# Patient Record
Sex: Female | Born: 1985 | Race: Black or African American | Hispanic: No | Smoking: Never smoker
Health system: Southern US, Community
[De-identification: ages and names within clinical notes are randomized; demographics above are authoritative.]

## PROBLEM LIST (undated history)

## (undated) ENCOUNTER — Inpatient Hospital Stay (HOSPITAL_COMMUNITY): Payer: Self-pay

## (undated) DIAGNOSIS — R000 Tachycardia, unspecified: Secondary | ICD-10-CM

## (undated) DIAGNOSIS — O210 Mild hyperemesis gravidarum: Secondary | ICD-10-CM

## (undated) DIAGNOSIS — O169 Unspecified maternal hypertension, unspecified trimester: Secondary | ICD-10-CM

## (undated) DIAGNOSIS — D649 Anemia, unspecified: Secondary | ICD-10-CM

## (undated) DIAGNOSIS — D563 Thalassemia minor: Secondary | ICD-10-CM

## (undated) DIAGNOSIS — K649 Unspecified hemorrhoids: Secondary | ICD-10-CM

## (undated) DIAGNOSIS — I10 Essential (primary) hypertension: Secondary | ICD-10-CM

## (undated) HISTORY — DX: Anemia, unspecified: D64.9

## (undated) HISTORY — DX: Unspecified maternal hypertension, unspecified trimester: O16.9

## (undated) HISTORY — DX: Thalassemia minor: D56.3

## (undated) HISTORY — DX: Tachycardia, unspecified: R00.0

## (undated) HISTORY — DX: Mild hyperemesis gravidarum: O21.0

---
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
## 2006-08-10 ENCOUNTER — Emergency Department (HOSPITAL_COMMUNITY): Admission: EM | Admit: 2006-08-10 | Discharge: 2006-08-10 | Payer: Self-pay | Admitting: Emergency Medicine

## 2006-09-01 ENCOUNTER — Ambulatory Visit: Payer: Self-pay | Admitting: Family Medicine

## 2007-02-17 ENCOUNTER — Encounter: Payer: Self-pay | Admitting: Obstetrics and Gynecology

## 2007-02-17 ENCOUNTER — Ambulatory Visit: Payer: Self-pay | Admitting: Obstetrics & Gynecology

## 2007-08-13 ENCOUNTER — Inpatient Hospital Stay (HOSPITAL_COMMUNITY): Admission: AD | Admit: 2007-08-13 | Discharge: 2007-08-13 | Payer: Self-pay | Admitting: Obstetrics & Gynecology

## 2007-12-13 ENCOUNTER — Emergency Department (HOSPITAL_COMMUNITY): Admission: EM | Admit: 2007-12-13 | Discharge: 2007-12-13 | Payer: Self-pay | Admitting: Emergency Medicine

## 2007-12-15 ENCOUNTER — Inpatient Hospital Stay (HOSPITAL_COMMUNITY): Admission: AD | Admit: 2007-12-15 | Discharge: 2007-12-15 | Payer: Self-pay | Admitting: Obstetrics and Gynecology

## 2008-01-18 ENCOUNTER — Inpatient Hospital Stay (HOSPITAL_COMMUNITY): Admission: AD | Admit: 2008-01-18 | Discharge: 2008-01-18 | Payer: Self-pay | Admitting: Obstetrics & Gynecology

## 2008-01-25 ENCOUNTER — Inpatient Hospital Stay (HOSPITAL_COMMUNITY): Admission: AD | Admit: 2008-01-25 | Discharge: 2008-01-25 | Payer: Self-pay | Admitting: Obstetrics

## 2008-02-01 ENCOUNTER — Inpatient Hospital Stay (HOSPITAL_COMMUNITY): Admission: AD | Admit: 2008-02-01 | Discharge: 2008-02-01 | Payer: Self-pay | Admitting: Obstetrics

## 2008-03-17 ENCOUNTER — Ambulatory Visit (HOSPITAL_COMMUNITY): Admission: RE | Admit: 2008-03-17 | Discharge: 2008-03-17 | Payer: Self-pay | Admitting: Obstetrics & Gynecology

## 2008-06-02 ENCOUNTER — Ambulatory Visit (HOSPITAL_COMMUNITY): Admission: RE | Admit: 2008-06-02 | Discharge: 2008-06-02 | Payer: Self-pay | Admitting: Obstetrics & Gynecology

## 2008-08-10 ENCOUNTER — Inpatient Hospital Stay (HOSPITAL_COMMUNITY): Admission: RE | Admit: 2008-08-10 | Discharge: 2008-08-13 | Payer: Self-pay | Admitting: Obstetrics & Gynecology

## 2008-08-11 ENCOUNTER — Encounter: Payer: Self-pay | Admitting: Obstetrics & Gynecology

## 2008-10-03 ENCOUNTER — Inpatient Hospital Stay (HOSPITAL_COMMUNITY): Admission: AD | Admit: 2008-10-03 | Discharge: 2008-10-03 | Payer: Self-pay | Admitting: Obstetrics & Gynecology

## 2009-11-28 ENCOUNTER — Emergency Department (HOSPITAL_COMMUNITY): Admission: EM | Admit: 2009-11-28 | Discharge: 2009-11-28 | Payer: Self-pay | Admitting: Family Medicine

## 2010-01-18 ENCOUNTER — Emergency Department (HOSPITAL_COMMUNITY): Admission: EM | Admit: 2010-01-18 | Discharge: 2010-01-18 | Payer: Self-pay | Admitting: Family Medicine

## 2010-03-20 ENCOUNTER — Ambulatory Visit: Payer: Self-pay | Admitting: Nurse Practitioner

## 2010-03-20 ENCOUNTER — Inpatient Hospital Stay (HOSPITAL_COMMUNITY): Admission: AD | Admit: 2010-03-20 | Discharge: 2010-03-20 | Payer: Self-pay | Admitting: Obstetrics & Gynecology

## 2010-06-19 ENCOUNTER — Ambulatory Visit: Payer: Self-pay | Admitting: Nurse Practitioner

## 2010-06-19 ENCOUNTER — Inpatient Hospital Stay (HOSPITAL_COMMUNITY): Admission: AD | Admit: 2010-06-19 | Discharge: 2010-06-19 | Payer: Self-pay | Admitting: Obstetrics and Gynecology

## 2010-06-24 ENCOUNTER — Ambulatory Visit: Payer: Self-pay | Admitting: Nurse Practitioner

## 2010-06-24 ENCOUNTER — Inpatient Hospital Stay (HOSPITAL_COMMUNITY): Admission: RE | Admit: 2010-06-24 | Discharge: 2010-06-24 | Payer: Self-pay | Admitting: Family Medicine

## 2010-08-21 ENCOUNTER — Ambulatory Visit: Payer: Self-pay | Admitting: Obstetrics and Gynecology

## 2010-08-21 LAB — CONVERTED CEMR LAB
HCT: 30.3 % — ABNORMAL LOW (ref 36.0–46.0)
HIV: NONREACTIVE
Hemoglobin: 10 g/dL — ABNORMAL LOW (ref 12.0–15.0)
MCHC: 33 g/dL (ref 30.0–36.0)
MCV: 79.3 fL (ref 78.0–100.0)
Platelets: 247 10*3/uL (ref 150–400)
RBC: 3.82 M/uL — ABNORMAL LOW (ref 3.87–5.11)
RDW: 13.5 % (ref 11.5–15.5)
WBC: 9.1 10*3/uL (ref 4.0–10.5)

## 2010-09-04 ENCOUNTER — Ambulatory Visit: Payer: Self-pay | Admitting: Obstetrics and Gynecology

## 2010-09-18 ENCOUNTER — Ambulatory Visit: Payer: Self-pay | Admitting: Obstetrics and Gynecology

## 2010-09-25 ENCOUNTER — Ambulatory Visit
Admission: RE | Admit: 2010-09-25 | Discharge: 2010-09-25 | Payer: Self-pay | Source: Home / Self Care | Attending: Obstetrics and Gynecology | Admitting: Obstetrics and Gynecology

## 2010-09-25 LAB — POCT URINALYSIS DIPSTICK
Bilirubin Urine: NEGATIVE
Ketones, ur: 15 mg/dL — AB
Nitrite: NEGATIVE
Protein, ur: NEGATIVE mg/dL
Specific Gravity, Urine: >=1.03 (ref 1.005–1.030)
Urine Glucose, Fasting: NEGATIVE mg/dL
Urobilinogen, UA: 1 mg/dL (ref 0.0–1.0)
pH: 6 (ref 5.0–8.0)

## 2010-10-02 ENCOUNTER — Ambulatory Visit
Admission: RE | Admit: 2010-10-02 | Discharge: 2010-10-02 | Payer: Self-pay | Source: Home / Self Care | Attending: Obstetrics and Gynecology | Admitting: Obstetrics and Gynecology

## 2010-10-07 LAB — POCT URINALYSIS DIPSTICK
Bilirubin Urine: NEGATIVE
Ketones, ur: NEGATIVE mg/dL
Nitrite: NEGATIVE
Protein, ur: NEGATIVE mg/dL
Specific Gravity, Urine: 1.02 (ref 1.005–1.030)
Urine Glucose, Fasting: NEGATIVE mg/dL
Urobilinogen, UA: 0.2 mg/dL (ref 0.0–1.0)
pH: 6 (ref 5.0–8.0)

## 2010-10-16 ENCOUNTER — Encounter: Payer: Self-pay | Admitting: Obstetrics and Gynecology

## 2010-10-16 ENCOUNTER — Ambulatory Visit
Admission: RE | Admit: 2010-10-16 | Discharge: 2010-10-16 | Payer: Self-pay | Source: Home / Self Care | Attending: Obstetrics and Gynecology | Admitting: Obstetrics and Gynecology

## 2010-10-16 LAB — POCT URINALYSIS DIPSTICK
Bilirubin Urine: NEGATIVE
Ketones, ur: NEGATIVE mg/dL
Nitrite: NEGATIVE
Protein, ur: NEGATIVE mg/dL
Specific Gravity, Urine: 1.025 (ref 1.005–1.030)
Urine Glucose, Fasting: NEGATIVE mg/dL
Urobilinogen, UA: 1 mg/dL (ref 0.0–1.0)
pH: 6 (ref 5.0–8.0)

## 2010-10-16 LAB — CONVERTED CEMR LAB
Chlamydia, DNA Probe: NEGATIVE
GC Probe Amp, Genital: NEGATIVE

## 2010-10-17 ENCOUNTER — Encounter: Payer: Self-pay | Admitting: Obstetrics and Gynecology

## 2010-10-17 LAB — CONVERTED CEMR LAB
Clue Cells Wet Prep HPF POC: NONE SEEN
Trich, Wet Prep: NONE SEEN
WBC, Wet Prep HPF POC: NONE SEEN

## 2010-10-18 ENCOUNTER — Inpatient Hospital Stay (HOSPITAL_COMMUNITY)
Admission: AD | Admit: 2010-10-18 | Discharge: 2010-10-18 | Payer: Self-pay | Source: Home / Self Care | Attending: Obstetrics and Gynecology | Admitting: Obstetrics and Gynecology

## 2010-10-18 LAB — WET PREP, GENITAL
Clue Cells Wet Prep HPF POC: NONE SEEN
Trich, Wet Prep: NONE SEEN
Yeast Wet Prep HPF POC: NONE SEEN

## 2010-10-23 ENCOUNTER — Other Ambulatory Visit: Payer: Self-pay

## 2010-10-23 DIAGNOSIS — O0930 Supervision of pregnancy with insufficient antenatal care, unspecified trimester: Secondary | ICD-10-CM

## 2010-10-23 DIAGNOSIS — O34219 Maternal care for unspecified type scar from previous cesarean delivery: Secondary | ICD-10-CM

## 2010-10-23 LAB — POCT URINALYSIS DIPSTICK
Bilirubin Urine: NEGATIVE
Ketones, ur: NEGATIVE mg/dL
Nitrite: NEGATIVE
Protein, ur: NEGATIVE mg/dL
Specific Gravity, Urine: 1.025 (ref 1.005–1.030)
Urine Glucose, Fasting: NEGATIVE mg/dL
Urobilinogen, UA: 0.2 mg/dL (ref 0.0–1.0)
pH: 6 (ref 5.0–8.0)

## 2010-10-30 ENCOUNTER — Other Ambulatory Visit: Payer: Self-pay

## 2010-10-30 ENCOUNTER — Inpatient Hospital Stay (HOSPITAL_COMMUNITY)
Admission: AD | Admit: 2010-10-30 | Discharge: 2010-10-30 | Disposition: A | Discharge status: Home / Self Care | Payer: Medicaid Other | Source: Ambulatory Visit | Attending: Obstetrics and Gynecology | Admitting: Obstetrics and Gynecology

## 2010-10-30 DIAGNOSIS — O34219 Maternal care for unspecified type scar from previous cesarean delivery: Secondary | ICD-10-CM

## 2010-10-30 DIAGNOSIS — O479 False labor, unspecified: Secondary | ICD-10-CM | POA: Insufficient documentation

## 2010-10-30 DIAGNOSIS — O0930 Supervision of pregnancy with insufficient antenatal care, unspecified trimester: Secondary | ICD-10-CM

## 2010-10-30 LAB — POCT URINALYSIS DIPSTICK
Bilirubin Urine: NEGATIVE
Ketones, ur: NEGATIVE mg/dL
Nitrite: NEGATIVE
Protein, ur: NEGATIVE mg/dL
Specific Gravity, Urine: 1.015 (ref 1.005–1.030)
Urine Glucose, Fasting: NEGATIVE mg/dL
Urobilinogen, UA: 0.2 mg/dL (ref 0.0–1.0)
pH: 6 (ref 5.0–8.0)

## 2010-10-31 ENCOUNTER — Encounter: Payer: Self-pay | Admitting: Obstetrics and Gynecology

## 2010-11-04 ENCOUNTER — Inpatient Hospital Stay (HOSPITAL_COMMUNITY)
Admission: RE | Admit: 2010-11-04 | Discharge: 2010-11-07 | DRG: 766 | Disposition: A | Discharge status: Home / Self Care | Payer: Medicaid Other | Source: Ambulatory Visit | Attending: Obstetrics and Gynecology | Admitting: Obstetrics and Gynecology

## 2010-11-04 ENCOUNTER — Inpatient Hospital Stay (HOSPITAL_COMMUNITY)
Admission: AD | Admit: 2010-11-04 | Discharge: 2010-11-04 | Disposition: A | Discharge status: Home / Self Care | Payer: Medicaid Other | Source: Ambulatory Visit | Attending: Obstetrics and Gynecology | Admitting: Obstetrics and Gynecology

## 2010-11-04 DIAGNOSIS — O99892 Other specified diseases and conditions complicating childbirth: Secondary | ICD-10-CM | POA: Diagnosis present

## 2010-11-04 DIAGNOSIS — O9989 Other specified diseases and conditions complicating pregnancy, childbirth and the puerperium: Secondary | ICD-10-CM

## 2010-11-04 DIAGNOSIS — Z22330 Carrier of Group B streptococcus: Secondary | ICD-10-CM

## 2010-11-04 DIAGNOSIS — O34219 Maternal care for unspecified type scar from previous cesarean delivery: Secondary | ICD-10-CM

## 2010-11-04 DIAGNOSIS — O479 False labor, unspecified: Secondary | ICD-10-CM | POA: Insufficient documentation

## 2010-11-04 LAB — SURGICAL PCR SCREEN
MRSA, PCR: NEGATIVE
Staphylococcus aureus: NEGATIVE

## 2010-11-04 LAB — CBC
HCT: 31.5 % — ABNORMAL LOW (ref 36.0–46.0)
Hemoglobin: 9.9 g/dL — ABNORMAL LOW (ref 12.0–15.0)
MCH: 24.6 pg — ABNORMAL LOW (ref 26.0–34.0)
MCHC: 31.4 g/dL (ref 30.0–36.0)
MCV: 78.4 fL (ref 78.0–100.0)
Platelets: 256 10*3/uL (ref 150–400)
RBC: 4.02 MIL/uL (ref 3.87–5.11)
RDW: 15.5 % (ref 11.5–15.5)
WBC: 11.7 10*3/uL — ABNORMAL HIGH (ref 4.0–10.5)

## 2010-11-04 LAB — RPR: RPR Ser Ql: NONREACTIVE

## 2010-11-05 LAB — CBC
HCT: 26.5 % — ABNORMAL LOW (ref 36.0–46.0)
Hemoglobin: 8.4 g/dL — ABNORMAL LOW (ref 12.0–15.0)
MCH: 24.9 pg — ABNORMAL LOW (ref 26.0–34.0)
MCHC: 31.7 g/dL (ref 30.0–36.0)
MCV: 78.6 fL (ref 78.0–100.0)
Platelets: 209 10*3/uL (ref 150–400)
RBC: 3.37 MIL/uL — ABNORMAL LOW (ref 3.87–5.11)
RDW: 15.7 % — ABNORMAL HIGH (ref 11.5–15.5)
WBC: 10.2 10*3/uL (ref 4.0–10.5)

## 2010-11-07 NOTE — Op Note (Addendum)
NAME:  Gwendolyn Lynch, Gwendolyn Lynch NO.:  [id number]  MEDICAL RECORD NO.:  [id number]         PATIENT TYPE:  WINP  LOCATION:  102                           [facility]ACILITY:  [facility]  PHYSICIAN:  Catalina Antigua, MD     DATE OF BIRTH:  14-Jan-1986  DATE OF PROCEDURE: DATE OF DISCHARGE:                              OPERATIVE REPORT   PREOPERATIVE DIAGNOSIS:  A 25 year old, gravida 3, para 2-0-0-2, with a history of C-section x1 who desires a repeat C-section.  POSTOPERATIVE DIAGNOSIS:  A 25 year old, gravida 3, para 2-0-0-2, with a history of C-section x1 who desires a repeat C-section.  PROCEDURE:  Repeat low transverse cesarean section.  SURGEONS:  Catalina Antigua, MD and Lucina Mellow, DO  ANESTHESIA:  Spinal.  FINDINGS:  A female infant, 10 pounds and 6 ounces, Apgars 8 and 9. Normal uterus, tubes, and ovaries.  SPECIMENS:  Placenta to Labor and Delivery.  ESTIMATED BLOOD LOSS:  700.Marland Kitchen  FLUIDS:  4000.  URINE:  200.Marland Kitchen  INDICATION:  This is a 25 year old, gravida 3, para 2-0-0-2, with history of cesarean section who desires elective repeat.  FINDINGS:  A female infant in the vertex presentation, Apgars 8 and 9. Normal uterus, tubes, and ovaries.  PROCEDURE:  The patient was taken to the operating room where spinal anesthesia was found to be adequate.  She was prepared and draped in a normal sterile fashion in the dorsal supine position with a leftward tilt.  A Pfannenstiel skin incision was then made with the scalpel and carried through to the underlying fascia with the Bovie and the knife. The fascia was incised in the midline and excised and the incision was extended laterally with Mayo scissors.  The superior aspect of the fascial incision was then grasped with Kocher clamps, elevated and the underlying rectus muscle was dissected off bluntly.  Attention was then turned to the inferior aspect of the incision which in similar fashion was grasped, tented up with Kocher  clamps and the rectus muscle dissected off bluntly.  The rectus muscles were then separated in the midline.  The peritoneum was identified, tented up, and entered sharply with hemostats.  The peritoneal incision was extended laterally superiorly with good visualization of the bladder.  Alexis retractor was then inserted and the uterine and vesicouterine peritoneum was identified, grasped with pickups, and entered sharply with Metzenbaum scissors.  The incision was extended laterally and the bladder flap was created digitally.  The lower uterine segment was then incised in a transverse fashion with the scalpel.  The uterine incision was then extended with lateral traction.  The infant's head was delivered atraumatically.  The nose and mouth were suctioned with bulb suction and the cord was clamped and cut. Cord blood sample was obtained.  The infant was handed off to the waiting pediatricians.  The placenta was then removed manually.  The uterus was cleared of all clots and debris.  The uterine incision was repaired with 2-0 Vicryl in a running locked fashion.  Excellent hemostasis was noted to be obtained.  The ovaries and tubes were then identified bilaterally and noted to be normal.  The gutters were  cleared of all clots.  The area was irrigated and the peritoneum was closed with 3-0 Vicryl.  The fascia was then reapproximated with 0 Vicryl in a running fashion.  The skin was closed with staples.  The patient tolerated the procedure well. Sponge, lap, and needle counts were correct x3.  One gram of Ancef was given prior to procedure.  The patient was taken to the recovery room in stable condition and the infant to the Nursery.    ______________________________ Lucina Mellow, DO   ______________________________ Catalina Antigua, MD    SH/MEDQ  D:  11/04/2010  T:  11/05/2010  Job:  [id number]  Electronically Signed by Lucina Mellow MD on 11/07/2010 07:06:19  PM Electronically Signed by Catalina Antigua  on 11/17/2010 10:21:47 AM

## 2010-11-11 NOTE — Discharge Summary (Addendum)
  Gwendolyn Lynch, Gwendolyn Lynch              ACCOUNT NO.:  [id number]  MEDICAL RECORD NO.:  [id number]           PATIENT TYPE:  I  LOCATION:  9102                          [facility]:  [facility]  PHYSICIAN:  Catalina Antigua, MD     DATE OF BIRTH:  [birth year]  DATE OF ADMISSION:  11/04/2010 DATE OF DISCHARGE:  11/07/2010                              DISCHARGE SUMMARY   ADMISSION DIAGNOSES: 1. Intrauterine pregnancy at 17 and 1 weeks. 2. Previous cesarean section x1.  DISCHARGE DIAGNOSIS:  Status post repeat low transverse cesarean section on November 04, 2010.  ATTENDING:  Catalina Antigua, MD  FELLOW:  Maryelizabeth Kaufmann, MD  DISCHARGE MEDICATIONS: 1. Ibuprofen 600 mg one tablet p.o. q.6 h. as needed. 2. Percocet 5/325 one tablet p.o. q.4 h. as needed. 3. Colace 100 mg one tablet p.o. b.i.d. 4. Ferrous sulfate 325 one tablet p.o. b.i.d.  OPERATIONS:  Repeat low transverse cesarean section on November 04, 2010, by Dr. Jolayne Panther and Dr. Natale Milch with delivery of a viable female infant, Apgars were 8 and 9, weight was 6 pounds 10 ounces.  Normal uterus, tubes, and ovaries and no complications.  PERTINENT LABORATORY:  Postoperative hemoglobin was 8.4 and 26.5 down from initial preop hemoglobin of 9.9 and 31.5.  Platelets were stable at 209.  HOSPITAL COURSE:  On admission, this is a gravida 3, para 1-0-1-1, who presented with intrauterine pregnancy at 39 weeks and 1 day with previous C-section, who desire repeat C-section.  The patient underwent cesarean section as previously stated without any complications.  Her postoperative course was benign.  Postoperative hemoglobin is as mentioned with 8.4 and hemoglobin 26.5.  The patient is otherwise ambulatory, asymptomatic and vital signs were stable.  The patient was continued on iron for postoperative and postpartum anemia.  The patient was otherwise hemodynamically stable and afebrile.  The patient was discharged home on postoperative day 3 in stable  condition.  There were no complications.  DISPOSITION:  Discharged to home.  DISCHARGE CONDITION:  Stable.  FOLLOWUP:  The patient is to follow up in [facility] in 6 weeks for postpartum check.  We will have the Baby Love Nurse to do a 2-week wound check at home.  EMERGENCY ROOM WARNINGS:  The patient should return to the emergency department with any fever, chills, nausea, vomiting, any problems with her incision such as redness, swelling, opening, dehiscences, discharge, foul odor, or any other concerning symptoms.    ______________________________ Maryelizabeth Kaufmann, MD   ______________________________ Catalina Antigua, MD    LC/MEDQ  D:  11/07/2010  T:  11/08/2010  Job:  [id number]  Electronically Signed by Maryelizabeth Kaufmann MD on 11/11/2010 12:16:11 PM Electronically Signed by Catalina Antigua  on 11/17/2010 10:21:41 AM

## 2010-12-02 LAB — POCT URINALYSIS DIPSTICK
Bilirubin Urine: NEGATIVE
Glucose, UA: NEGATIVE mg/dL
Ketones, ur: NEGATIVE mg/dL
Nitrite: NEGATIVE
Protein, ur: NEGATIVE mg/dL
Specific Gravity, Urine: 1.02 (ref 1.005–1.030)
Urobilinogen, UA: 0.2 mg/dL (ref 0.0–1.0)
pH: 6.5 (ref 5.0–8.0)

## 2010-12-03 LAB — POCT URINALYSIS DIPSTICK
Bilirubin Urine: NEGATIVE
Glucose, UA: NEGATIVE mg/dL
Ketones, ur: NEGATIVE mg/dL
Nitrite: NEGATIVE
Protein, ur: NEGATIVE mg/dL
Specific Gravity, Urine: >=1.03 (ref 1.005–1.030)
Urobilinogen, UA: 0.2 mg/dL (ref 0.0–1.0)
pH: 6 (ref 5.0–8.0)

## 2010-12-05 LAB — CBC
HCT: 28.2 % — ABNORMAL LOW (ref 36.0–46.0)
Hemoglobin: 9.6 g/dL — ABNORMAL LOW (ref 12.0–15.0)
MCH: 28.2 pg (ref 26.0–34.0)
MCHC: 34.2 g/dL (ref 30.0–36.0)
MCV: 82.3 fL (ref 78.0–100.0)
Platelets: 196 10*3/uL (ref 150–400)
RBC: 3.43 MIL/uL — ABNORMAL LOW (ref 3.87–5.11)
RDW: 14.8 % (ref 11.5–15.5)
WBC: 9.9 10*3/uL (ref 4.0–10.5)

## 2010-12-05 LAB — URINE CULTURE
Colony Count: >=100000
Culture  Setup Time: 201109290117

## 2010-12-05 LAB — URINALYSIS, ROUTINE W REFLEX MICROSCOPIC
Bilirubin Urine: NEGATIVE
Glucose, UA: NEGATIVE mg/dL
Ketones, ur: NEGATIVE mg/dL
Nitrite: NEGATIVE
Protein, ur: NEGATIVE mg/dL
Specific Gravity, Urine: >1.03 — ABNORMAL HIGH (ref 1.005–1.030)
Urobilinogen, UA: 0.2 mg/dL (ref 0.0–1.0)
pH: 6 (ref 5.0–8.0)

## 2010-12-05 LAB — DIFFERENTIAL
Basophils Absolute: 0.1 10*3/uL (ref 0.0–0.1)
Basophils Relative: 1 % (ref 0–1)
Eosinophils Absolute: 0.5 10*3/uL (ref 0.0–0.7)
Eosinophils Relative: 5 % (ref 0–5)
Lymphocytes Relative: 22 % (ref 12–46)
Lymphs Abs: 2.1 10*3/uL (ref 0.7–4.0)
Monocytes Absolute: 0.4 10*3/uL (ref 0.1–1.0)
Monocytes Relative: 4 % (ref 3–12)
Neutro Abs: 6.8 10*3/uL (ref 1.7–7.7)
Neutrophils Relative %: 69 % (ref 43–77)

## 2010-12-05 LAB — URINE MICROSCOPIC-ADD ON

## 2010-12-05 LAB — SICKLE CELL SCREEN: Sickle Cell Screen: NEGATIVE

## 2010-12-05 LAB — RUBELLA SCREEN: Rubella: 75.6 IU/mL — ABNORMAL HIGH

## 2010-12-05 LAB — GC/CHLAMYDIA PROBE AMP, GENITAL
Chlamydia, DNA Probe: NEGATIVE
GC Probe Amp, Genital: NEGATIVE

## 2010-12-05 LAB — WET PREP, GENITAL
Clue Cells Wet Prep HPF POC: NONE SEEN
Trich, Wet Prep: NONE SEEN
Yeast Wet Prep HPF POC: NONE SEEN

## 2010-12-05 LAB — RPR: RPR Ser Ql: NONREACTIVE

## 2010-12-05 LAB — TYPE AND SCREEN: Antibody Screen: NEGATIVE

## 2010-12-08 LAB — WET PREP, GENITAL
Clue Cells Wet Prep HPF POC: NONE SEEN
Yeast Wet Prep HPF POC: NONE SEEN

## 2010-12-08 LAB — CBC
Hemoglobin: 11.4 g/dL — ABNORMAL LOW (ref 12.0–15.0)
MCH: 26.6 pg (ref 26.0–34.0)
MCV: 79.1 fL (ref 78.0–100.0)
Platelets: 225 10*3/uL (ref 150–400)
RBC: 4.29 MIL/uL (ref 3.87–5.11)

## 2010-12-08 LAB — GC/CHLAMYDIA PROBE AMP, GENITAL: Chlamydia, DNA Probe: NEGATIVE

## 2010-12-16 LAB — POCT URINALYSIS DIP (DEVICE)
Bilirubin Urine: NEGATIVE
Ketones, ur: NEGATIVE mg/dL
pH: 5.5 (ref 5.0–8.0)

## 2010-12-25 ENCOUNTER — Ambulatory Visit (INDEPENDENT_AMBULATORY_CARE_PROVIDER_SITE_OTHER): Payer: Medicaid Other | Admitting: Obstetrics and Gynecology

## 2011-02-04 NOTE — Op Note (Signed)
Gwendolyn Lynch, Gwendolyn Lynch NO.:  [id number]   MEDICAL RECORD NO.:  [id number]          PATIENT TYPE:  INP   LOCATION:  9113                          [facility]ACILITY:  [facility]   PHYSICIAN:  Roseanna Rainbow, M.D.DATE OF BIRTH:  [birth year]   DATE OF PROCEDURE:  08/11/2008  DATE OF DISCHARGE:                               OPERATIVE REPORT   PREOPERATIVE DIAGNOSES:  Intrauterine pregnancy at term, protracted  active phase of labor, suspicious fetal heart tracing with variable  decelerations.   POSTOPERATIVE DIAGNOSES:  Intrauterine pregnancy at term, protracted  active phase of labor, suspicious fetal heart tracing with variable  decelerations.   PROCEDURE:  Primary low uterine flap elliptical cesarean delivery via  Pfannenstiel skin incision.   SURGEON:  Roseanna Rainbow, MD   ANESTHESIA:  Epidural.   ESTIMATED BLOOD LOSS:  600 mL.   COMPLICATIONS:  None.   PROCEDURE:  The patient was taken to the operating room with an IV  running and an epidural catheter in situ.  She was then placed in the  dorsal supine position with a leftward tilt and prepped and draped in  the usual sterile fashion.  After a time-out had been completed, a  Pfannenstiel skin incision was then made with the scalpel and carried  down to the underlying fascia.  The fascia was nicked in the midline.  The fascial incision was then extended bilaterally.  The superior aspect  of the fascial incision was tented up and the underlying rectus muscles  dissected off.  The inferior aspect of the fascial incision was  manipulated in a similar fashion.  The rectus muscles were separated in  the midline.  The parietal peritoneum was tented up and entered sharply.  This incision was then extended superiorly and inferiorly with good  visualization of the bladder.  The bladder blade was then placed.  The  vesicouterine peritoneum was then tented up and entered sharply.  This  incision was then extended  bilaterally and the bladder flap created  bluntly.  The bladder blade was then replaced.  The lower uterine  segment was then incised in a transverse fashion with a scalpel.  This  incision was then extended bluntly.  The infant's head was then  delivered atraumatically.  The oropharynx was suctioned with bulb  suction.  The cord was clamped and cut.  The infant was handed off to  the awaiting neonatologist.  The placenta was then removed.  The  intrauterine cavity was evacuated of any remaining amniotic fluid clots  and debris with moistened laparotomy sponge.  The uterine incision was  then reapproximated in a running interlocking fashion using a suture of  Monocryl.  A second imbricating layer of the same suture was then  placed.  Adequate hemostasis was noted.  The paracolic gutters were then  irrigated.  The parietal peritoneum was then reapproximated in a running  fashion using a running suture of 2-0 Vicryl.  The fascia  was closed in a running fashion using 2 sutures of Vicryl.  The fascia  was closed with staples.  At the closure of the procedure,  the  instrument and pack counts were said to be correct x2.  Cephazolin 1 g  had been given at the start of the procedure.  The patient was taken to  the PACU awake and in stable condition.      Roseanna Rainbow, M.D.  Electronically Signed     LAJ/MEDQ  D:  08/11/2008  T:  08/12/2008  Job:  [id number]

## 2011-02-04 NOTE — Discharge Summary (Signed)
NAMEMARIAEDUARDA, DEFRANCO NO.:  [id number]   MEDICAL RECORD NO.:  [id number]          PATIENT TYPE:  INP   LOCATION:  9113                          [facility]   PHYSICIAN:  Roseanna Rainbow, M.D.DATE OF BIRTH:  06-14-1986   DATE OF ADMISSION:  08/10/2008  DATE OF DISCHARGE:  08/13/2008                               DISCHARGE SUMMARY   CHIEF COMPLAINT:  The patient is a 25 year old gravida 2, para 1 with an  estimated date of confinement of August 12, 2008, who presents for  induction of labor.   HISTORY OF PRESENT ILLNESS:  Please see the above.   PAST SURGICAL HISTORY:  She denies.   PAST MEDICAL HISTORY:  Noncontributory.   MEDICATIONS:  Prenatal vitamins.   ALLERGIES:  No known drug allergies.   SOCIAL HISTORY:  She is single.  She denies any tobacco, ethanol, or  drug use.   FAMILY HISTORY:  Remarkable for hypertension, diabetes.   PHYSICAL EXAMINATION:  VITAL SIGNS:  Stable, afebrile.  LUNGS:  Clear to auscultation bilaterally.  HEART:  Regular rate and rhythm.  ABDOMEN:  Gravid, nontender.  The cervix is closed, long with the vertex  at -2 station.   ASSESSMENT/PLAN:  Intrauterine pregnancy at 39 weeks for induction of  labor.   PLAN:  Admission.  Cervical ripening with Cytotec.   HOSPITAL COURSE:  Several hours after admission, there was a prolonged  fetal heart deceleration associated with hypertonic uterine  contractions.  Intrauterine resuscitative efforts included a dose of  subcutaneous terbutaline.  The membranes were ruptured and an  intrauterine pressure catheter was inserted.  An amnioinfusion bolus was  started followed by continuous infusion.  The cervix at that point was 4  cm dilated, 90% effaced, with vertex at -1 station.  The fetal heart  tracing improved.  She progressed to 6 cm with dilatation.  There was no  further progress and the decision at this point was to proceed with a  cesarean delivery.  Please see the  dictated operative summary.  On  postoperative day #1, her hemoglobin was 8.1.  She was asymptomatic.  The remainder of her hospital course was uneventful.  She received Depo-  Provera prior to discharge to home on postoperative day #2.   DISCHARGE DIAGNOSES:  Intrauterine pregnancy at term, arrest of  dilatation, active phase of labor, anemia.   PROCEDURE:  Cesarean delivery.   CONDITION:  Stable.   DIET:  Regular.   ACTIVITY:  Progressive activity, pelvic rest.   MEDICATIONS:  Ibuprofen and Percocet.   DISPOSITION:  The patient was to follow up in the office in several  days.      Roseanna Rainbow, M.D.  Electronically Signed     LAJ/MEDQ  D:  09/08/2008  T:  09/09/2008  Job:  [id number]

## 2011-02-04 NOTE — Group Therapy Note (Signed)
NAME:  Gwendolyn Lynch, Gwendolyn Lynch NO.:  [id number]   MEDICAL RECORD NO.:  [id number]          PATIENT TYPE:  WOC   LOCATION:  [facility]                   FACILITY:  [facility]   PHYSICIAN:  Argentina Donovan, MD        DATE OF BIRTH:  December 27, 1985   DATE OF SERVICE:                                  CLINIC NOTE   The patient is a 25 year old gravida 1, para 1-0-0-1 with a child age  three who following the birth of her last baby want on Depo-Provera  which she stopped for 1 year ago in May of last year, has been  amenorrheic since that time, has run several random  home urine  pregnancy tests in the last few months.  All have been negative.  The  patient has no breast discharge, has had no abnormal hair growth, has no  signs of hot flashes or of sleep difficulties, has not had any traumatic  events in her life.  She is taking nothing for contraception but is  sexually active.  She wants nothing for contraception and she and her  partner say they do want a little girl eventually.   PHYSICAL EXAMINATION:  On examination her abdomen is soft, flat,  nontender.  No masses or organomegaly.  EXTERNAL GENITALIA:  Is normal.  BUS is within normal limits.  Vagina is  clean and well rugated.  Cervix clean and parous and the uterus of  normal size, shape, consistency.  Adnexa is normal.   I am going to put the patient on Provera 10 mg b.i.d. for 1 week, have  told her if she does not withdrawal within a week after that to come  back and  get a serum pregnancy test, FSH, TSH. I've told the patient my  impression is of secondary amenorrhea following Depo-Provera injection  and that she will eventually become ovulatory again.  She is only 4 feet  10 inches and weighs 115 pounds.           ______________________________  Argentina Donovan, MD     PR/MEDQ  D:  02/17/2007  T:  02/17/2007  Job:  [id number]

## 2011-06-16 LAB — CBC
HCT: 35.6 — ABNORMAL LOW
Hemoglobin: 11.5 — ABNORMAL LOW
RBC: 4.48
RDW: 14.4

## 2011-06-16 LAB — DIFFERENTIAL
Eosinophils Relative: 5
Lymphocytes Relative: 41
Lymphs Abs: 3.3
Monocytes Absolute: 0.5
Monocytes Relative: 6

## 2011-06-16 LAB — PREGNANCY, URINE: Preg Test, Ur: POSITIVE

## 2011-06-16 LAB — URINALYSIS, ROUTINE W REFLEX MICROSCOPIC
Glucose, UA: NEGATIVE
Glucose, UA: NEGATIVE
Ketones, ur: NEGATIVE
Leukocytes, UA: NEGATIVE
Nitrite: NEGATIVE
Protein, ur: NEGATIVE
Specific Gravity, Urine: 1.025

## 2011-06-16 LAB — BASIC METABOLIC PANEL
GFR calc Af Amer: >60
GFR calc non Af Amer: >60
Glucose, Bld: 101 — ABNORMAL HIGH
Potassium: 4.1
Sodium: 139

## 2011-06-16 LAB — URINE MICROSCOPIC-ADD ON

## 2011-06-17 LAB — URINALYSIS, ROUTINE W REFLEX MICROSCOPIC
Nitrite: NEGATIVE
Specific Gravity, Urine: >1.03 — ABNORMAL HIGH
pH: 6

## 2011-06-17 LAB — COMPREHENSIVE METABOLIC PANEL
ALT: 12
AST: 21
Albumin: 3.6
Alkaline Phosphatase: 55
GFR calc Af Amer: >60
Glucose, Bld: 81
Potassium: 3.7
Sodium: 135
Total Protein: 7.4

## 2011-06-17 LAB — URINE MICROSCOPIC-ADD ON

## 2011-06-17 LAB — CBC
Hemoglobin: 11.3 — ABNORMAL LOW
RBC: 4.19
RDW: 14.1

## 2011-06-24 LAB — CBC
HCT: 24.1 — ABNORMAL LOW
HCT: 31.2 — ABNORMAL LOW
Hemoglobin: 10.4 — ABNORMAL LOW
Hemoglobin: 8.1 — ABNORMAL LOW
MCHC: 33.3
MCHC: 33.5
MCV: 80.6
MCV: 82.8
Platelets: 161
Platelets: 193
RBC: 2.91 — ABNORMAL LOW
RBC: 3.87
RDW: 15.7 — ABNORMAL HIGH
RDW: 15.9 — ABNORMAL HIGH
WBC: 11.4 — ABNORMAL HIGH
WBC: 11.5 — ABNORMAL HIGH

## 2011-06-24 LAB — RPR: RPR Ser Ql: NONREACTIVE

## 2011-07-01 LAB — URINALYSIS, ROUTINE W REFLEX MICROSCOPIC
Nitrite: NEGATIVE
Protein, ur: NEGATIVE
Specific Gravity, Urine: 1.025
Urobilinogen, UA: 0.2

## 2011-07-01 LAB — WET PREP, GENITAL: Yeast Wet Prep HPF POC: NONE SEEN

## 2011-07-01 LAB — CBC
HCT: 32.4 — ABNORMAL LOW
Hemoglobin: 11 — ABNORMAL LOW
MCHC: 34
RBC: 3.99
RDW: 14.1

## 2011-07-01 LAB — URINE MICROSCOPIC-ADD ON

## 2011-07-01 LAB — GC/CHLAMYDIA PROBE AMP, GENITAL: Chlamydia, DNA Probe: NEGATIVE

## 2013-06-01 ENCOUNTER — Emergency Department (INDEPENDENT_AMBULATORY_CARE_PROVIDER_SITE_OTHER)
Admission: EM | Admit: 2013-06-01 | Discharge: 2013-06-01 | Disposition: A | Discharge status: Home / Self Care | Payer: Self-pay | Source: Home / Self Care | Attending: Family Medicine | Admitting: Family Medicine

## 2013-06-01 ENCOUNTER — Encounter (HOSPITAL_COMMUNITY): Payer: Self-pay | Admitting: *Deleted

## 2013-06-01 DIAGNOSIS — K645 Perianal venous thrombosis: Secondary | ICD-10-CM

## 2013-06-01 HISTORY — DX: Unspecified hemorrhoids: K64.9

## 2013-06-01 MED ORDER — HYDROCORTISONE ACETATE 25 MG RE SUPP: 25.0000 mg | Freq: Two times a day (BID) | RECTAL | Status: DC

## 2013-06-01 NOTE — ED Notes (Signed)
Pt       Reports  Symptoms    Of  hemmoriods      X  4  Years   She  Reports  Worse  Over  The  Last        Few  Days   Pt  Reports  Bleeding  As  Well  As  Pain  When  She  Sits  Down      -  The  Pt         denys  Any  Constipation            She  Is  Alert  And  Oriented  And  Is  In no  Acute  Distress

## 2013-06-01 NOTE — ED Provider Notes (Signed)
CSN: [id number]     Arrival date & time 06/01/13  1847 History   First MD Initiated Contact with Patient 06/01/13 1950     Chief Complaint  Patient presents with  . Hemorrhoids   (Consider location/radiation/quality/duration/timing/severity/associated sxs/prior Treatment) Patient is a 27 y.o. female presenting with hematochezia. The history is provided by the patient.  Rectal Bleeding Quality:  Bright red Duration:  1 week Progression:  Worsening Chronicity:  New Context: hemorrhoids and rectal pain   Pain details:    Quality:  Throbbing Similar prior episodes: yes   Worsened by:  Defecation Ineffective treatments:  Hemorrhoid cream and sitz baths Risk factors comment:  H/o hemorrhoids with childbirth.   History reviewed. No pertinent past medical history. No past surgical history on file. No family history on file. History  Substance Use Topics  . Smoking status: Not on file  . Smokeless tobacco: Not on file  . Alcohol Use: Not on file   OB History   Grav Para Term Preterm Abortions TAB SAB Ect Mult Living                 Review of Systems  Constitutional: Negative.   Gastrointestinal: Positive for hematochezia and anal bleeding.    Allergies  Review of patient's allergies indicates no known allergies.  Home Medications   Current Outpatient Rx  Name  Route  Sig  Dispense  Refill  . phenylephrine-shark liver oil-mineral oil-petrolatum (PREPARATION H) 0.25-3-14-71.9 % rectal ointment   Rectal   Place rectally 2 (two) times daily as needed for hemorrhoids.         . hydrocortisone (ANUSOL-HC) 25 MG suppository   Rectal   Place 1 suppository (25 mg total) rectally 2 (two) times daily.   12 suppository   0    BP 132/85  Pulse 68  Temp(Src) 99.2 F (37.3 C) (Oral)  Resp 16  SpO2 99% Physical Exam  Nursing note and vitals reviewed. Constitutional: She is oriented to person, place, and time. She appears well-developed and well-nourished.  Abdominal:  Soft. Bowel sounds are normal. She exhibits no distension. There is no tenderness.  Genitourinary: Rectal exam shows external hemorrhoid and tenderness.  Tender thrombosed ext hemorrhoids superiorly and inferiorly.  Neurological: She is alert and oriented to person, place, and time.  Skin: Skin is warm and dry.    ED Course  Procedures (including critical care time) Labs Review Labs Reviewed - No data to display Imaging Review No results found.  MDM      Linna Hoff, MD 06/01/13 2015

## 2013-06-05 ENCOUNTER — Emergency Department (HOSPITAL_COMMUNITY)
Admission: EM | Admit: 2013-06-05 | Discharge: 2013-06-05 | Discharge status: Against Medical Advice | Payer: Medicaid Other | Attending: Emergency Medicine | Admitting: Emergency Medicine

## 2013-06-05 ENCOUNTER — Encounter (HOSPITAL_COMMUNITY): Payer: Self-pay | Admitting: Emergency Medicine

## 2013-06-05 DIAGNOSIS — K649 Unspecified hemorrhoids: Secondary | ICD-10-CM

## 2013-06-05 DIAGNOSIS — IMO0002 Reserved for concepts with insufficient information to code with codable children: Secondary | ICD-10-CM | POA: Insufficient documentation

## 2013-06-05 NOTE — ED Notes (Signed)
C/o hemorrhoids x 4 years.  Reports increased pain and bleeding.

## 2013-06-05 NOTE — ED Provider Notes (Signed)
CSN: [id number]     Arrival date & time 06/05/13  1918 History   First MD Initiated Contact with Patient 06/05/13 1929     Chief Complaint  Patient presents with  . Hemorrhoids   (Consider location/radiation/quality/duration/timing/severity/associated sxs/prior Treatment) HPI Comments: Patient presents to the emergency department with chief complaint of hemorrhoids. She states that she has had a hemorrhoids for the past 2 weeks. She states that they're very painful. She states that she has noticed some associated bleeding. She denies any dizziness, or weakness. She states that they're worsened when she sits, and when she has a bowel movement. She has tried using hemorrhoidal cream and sitz bath. She has had hemorrhoids in the past following childbirth.  The history is provided by the patient. No language interpreter was used.    Past Medical History  Diagnosis Date  . Hemorrhoids    Past Surgical History  Procedure Laterality Date  . Cesarean section     No family history on file. History  Substance Use Topics  . Smoking status: Never Smoker   . Smokeless tobacco: Not on file  . Alcohol Use: No   OB History   Grav Para Term Preterm Abortions TAB SAB Ect Mult Living                 Review of Systems  All other systems reviewed and are negative.    Allergies  Review of patient's allergies indicates no known allergies.  Home Medications   Current Outpatient Rx  Name  Route  Sig  Dispense  Refill  . hydrocortisone (ANUSOL-HC) 25 MG suppository   Rectal   Place 1 suppository (25 mg total) rectally 2 (two) times daily.   12 suppository   0   . phenylephrine-shark liver oil-mineral oil-petrolatum (PREPARATION H) 0.25-3-14-71.9 % rectal ointment   Rectal   Place rectally 2 (two) times daily as needed for hemorrhoids.          BP 131/87  Pulse 68  Temp(Src) 99.1 F (37.3 C) (Oral)  Resp 16  SpO2 100% Physical Exam  Nursing note and vitals  reviewed. Constitutional: She is oriented to person, place, and time. She appears well-developed and well-nourished.  HENT:  Head: Normocephalic and atraumatic.  Eyes: Conjunctivae and EOM are normal. Pupils are equal, round, and reactive to light.  Neck: Normal range of motion. Neck supple.  Cardiovascular: Normal rate and regular rhythm.  Exam reveals no gallop and no friction rub.   No murmur heard. Pulmonary/Chest: Effort normal and breath sounds normal. No respiratory distress. She has no wheezes. She has no rales. She exhibits no tenderness.  Abdominal: Soft. Bowel sounds are normal. She exhibits no distension and no mass. There is no tenderness. There is no rebound and no guarding.  Musculoskeletal: Normal range of motion. She exhibits no edema and no tenderness.  Neurological: She is alert and oriented to person, place, and time.  Skin: Skin is warm and dry.  Psychiatric: She has a normal mood and affect. Her behavior is normal. Judgment and thought content normal.    ED Course  Procedures (including critical care time) Labs Review Labs Reviewed - No data to display Imaging Review No results found.  MDM  No diagnosis found. Patient eloped prior to rectal exam.  In my initial history, I had discussed with patient that she would need to continue using sitz bath and hemorrhoid cream.  I told her that I would return momentarily, but when I returned with the nurse  to perform the rectal exam, the patient had left.   Roxy Horseman, PA-C 06/05/13 2102

## 2013-06-05 NOTE — ED Notes (Signed)
Pt no longer in room. Pt eloped from ED, dx AMA. This RN never made aware pt was leaving until room discovered empty.

## 2013-06-06 NOTE — ED Provider Notes (Signed)
Medical screening examination/treatment/procedure(s) were performed by non-physician practitioner and as supervising physician I was immediately available for consultation/collaboration.   Audree Camel, MD 06/06/13 1259

## 2015-03-03 ENCOUNTER — Encounter (HOSPITAL_COMMUNITY): Payer: Self-pay | Admitting: *Deleted

## 2015-03-03 ENCOUNTER — Inpatient Hospital Stay (HOSPITAL_COMMUNITY): Payer: Medicaid Other

## 2015-03-03 ENCOUNTER — Inpatient Hospital Stay (HOSPITAL_COMMUNITY)
Admission: AD | Admit: 2015-03-03 | Discharge: 2015-03-03 | Disposition: A | Discharge status: Home / Self Care | Payer: Medicaid Other | Source: Ambulatory Visit | Attending: Obstetrics and Gynecology | Admitting: Obstetrics and Gynecology

## 2015-03-03 DIAGNOSIS — O26899 Other specified pregnancy related conditions, unspecified trimester: Secondary | ICD-10-CM

## 2015-03-03 DIAGNOSIS — Z3A01 Less than 8 weeks gestation of pregnancy: Secondary | ICD-10-CM | POA: Insufficient documentation

## 2015-03-03 DIAGNOSIS — O9989 Other specified diseases and conditions complicating pregnancy, childbirth and the puerperium: Secondary | ICD-10-CM | POA: Diagnosis not present

## 2015-03-03 DIAGNOSIS — O219 Vomiting of pregnancy, unspecified: Secondary | ICD-10-CM | POA: Insufficient documentation

## 2015-03-03 DIAGNOSIS — R109 Unspecified abdominal pain: Secondary | ICD-10-CM | POA: Diagnosis not present

## 2015-03-03 LAB — URINALYSIS, ROUTINE W REFLEX MICROSCOPIC
Bilirubin Urine: NEGATIVE
GLUCOSE, UA: NEGATIVE mg/dL
Ketones, ur: NEGATIVE mg/dL
LEUKOCYTES UA: NEGATIVE
NITRITE: NEGATIVE
Protein, ur: NEGATIVE mg/dL
Urobilinogen, UA: 0.2 mg/dL (ref 0.0–1.0)
pH: 6 (ref 5.0–8.0)

## 2015-03-03 LAB — CBC
HCT: 34.7 % — ABNORMAL LOW (ref 36.0–46.0)
Hemoglobin: 11.6 g/dL — ABNORMAL LOW (ref 12.0–15.0)
MCH: 26.2 pg (ref 26.0–34.0)
MCHC: 33.4 g/dL (ref 30.0–36.0)
MCV: 78.3 fL (ref 78.0–100.0)
PLATELETS: 232 10*3/uL (ref 150–400)
RBC: 4.43 MIL/uL (ref 3.87–5.11)
RDW: 14.3 % (ref 11.5–15.5)
WBC: 8.6 10*3/uL (ref 4.0–10.5)

## 2015-03-03 LAB — POCT PREGNANCY, URINE: Preg Test, Ur: POSITIVE — AB

## 2015-03-03 LAB — HCG, QUANTITATIVE, PREGNANCY: hCG, Beta Chain, Quant, S: 35511 m[IU]/mL — ABNORMAL HIGH (ref <5)

## 2015-03-03 LAB — WET PREP, GENITAL
Clue Cells Wet Prep HPF POC: NONE SEEN
Trich, Wet Prep: NONE SEEN
Yeast Wet Prep HPF POC: NONE SEEN

## 2015-03-03 LAB — URINE MICROSCOPIC-ADD ON

## 2015-03-03 MED ORDER — METOCLOPRAMIDE HCL 5 MG/ML IJ SOLN
10.0000 mg | Freq: Once | INTRAMUSCULAR | Status: AC
  Administered 2015-03-03: 10 mg via INTRAMUSCULAR
  Filled 2015-03-03: qty 2

## 2015-03-03 MED ORDER — METOCLOPRAMIDE HCL 10 MG PO TABS: 10.0000 mg | ORAL_TABLET | Freq: Three times a day (TID) | ORAL | Status: DC

## 2015-03-03 NOTE — MAU Note (Signed)
Pt started vomiting last week on Monday and has had a hard time keeping anything down ever since, lower abdominal cramping with emesis that lasts about five minutes then it goes away.  Denies VB.

## 2015-03-03 NOTE — MAU Provider Note (Signed)
History     CSN: [id number]  Arrival date and time: 03/03/15 1247   First Provider Initiated Contact with Patient 03/03/15 1327      Chief Complaint  Patient presents with  . Emesis   HPI  [name] is a 29 y.o. G4P3003 at [redacted]w[redacted]d here with report of vomiting x 7 days.  Unable to hold anything down for past 24 hours.  Initially patient stated she did not think she was pregnant.  +sexually active with no birth control use.  Denies fever, body aches, chills or recent exposure to any ill individuals.  +abdominal cramping.  No report of abnormal vaginal discharge.    Past Medical History  Diagnosis Date  . Hemorrhoids     Past Surgical History  Procedure Laterality Date  . Cesarean section      History reviewed. No pertinent family history.  History  Substance Use Topics  . Smoking status: Never Smoker   . Smokeless tobacco: Not on file  . Alcohol Use: No    Allergies: No Known Allergies  Prescriptions prior to admission  Medication Sig Dispense Refill Last Dose  . hydrocortisone (ANUSOL-HC) 25 MG suppository Place 1 suppository (25 mg total) rectally 2 (two) times daily. 12 suppository 0 Past Month at Unknown  . phenylephrine-shark liver oil-mineral oil-petrolatum (PREPARATION H) 0.25-3-14-71.9 % rectal ointment Place rectally 2 (two) times daily as needed for hemorrhoids.   Past Month at Unknown    Review of Systems  Constitutional: Negative for fever and chills.  Gastrointestinal: Positive for nausea, vomiting and abdominal pain (cramping). Negative for diarrhea and constipation (last BM this AM).  Genitourinary: Negative for dysuria, urgency and frequency.  Neurological: Negative for headaches.  All other systems reviewed and are negative.  Physical Exam   Blood pressure 109/71, pulse 73, temperature 98.2 F (36.8 C), temperature source Oral, resp. rate 18, height 4\' 10"  (1.473 m), weight 70.308 kg (155 lb).  Physical Exam  Constitutional: She is  oriented to person, place, and time. She appears well-developed and well-nourished. No distress.  HENT:  Head: Normocephalic.  Neck: Normal range of motion. Neck supple.  Cardiovascular: Normal rate, regular rhythm and normal heart sounds.   Respiratory: Effort normal and breath sounds normal.  GI: Soft. There is no tenderness. There is no rebound and no guarding.  Genitourinary: Uterus is enlarged (approximately 8 wk size). Cervix exhibits no motion tenderness. Right adnexum displays no tenderness. Left adnexum displays no tenderness. No bleeding (scant; dark red; no bright red or brisk bleed) in the vagina. No vaginal discharge (vaginal bleeding) found.  Musculoskeletal: Normal range of motion. She exhibits no edema.  Neurological: She is alert and oriented to person, place, and time. She has normal reflexes.  Skin: Skin is warm and dry.    MAU Course  Procedures  Results for orders placed or performed during the hospital encounter of 03/03/15 (from the past 24 hour(s))  Urinalysis, Routine w reflex microscopic (not at [facility])     Status: Abnormal   Collection Time: 03/03/15  1:05 PM  Result Value Ref Range   Color, Urine YELLOW YELLOW   APPearance CLEAR CLEAR   Specific Gravity, Urine >1.030 (H) 1.005 - 1.030   pH 6.0 5.0 - 8.0   Glucose, UA NEGATIVE NEGATIVE mg/dL   Hgb urine dipstick MODERATE (A) NEGATIVE   Bilirubin Urine NEGATIVE NEGATIVE   Ketones, ur NEGATIVE NEGATIVE mg/dL   Protein, ur NEGATIVE NEGATIVE mg/dL   Urobilinogen, UA 0.2 0.0 - 1.0  mg/dL   Nitrite NEGATIVE NEGATIVE   Leukocytes, UA NEGATIVE NEGATIVE  Urine microscopic-add on     Status: Abnormal   Collection Time: 03/03/15  1:05 PM  Result Value Ref Range   Squamous Epithelial / LPF FEW (A) RARE   WBC, UA 0-2 <3 WBC/hpf   RBC / HPF 0-2 <3 RBC/hpf  Pregnancy, urine POC     Status: Abnormal   Collection Time: 03/03/15  1:33 PM  Result Value Ref Range   Preg Test, Ur POSITIVE (A) NEGATIVE  Wet prep, genital      Status: Abnormal   Collection Time: 03/03/15  1:45 PM  Result Value Ref Range   Yeast Wet Prep HPF POC NONE SEEN NONE SEEN   Trich, Wet Prep NONE SEEN NONE SEEN   Clue Cells Wet Prep HPF POC NONE SEEN NONE SEEN   WBC, Wet Prep HPF POC FEW (A) NONE SEEN  hCG, quantitative, pregnancy     Status: Abnormal   Collection Time: 03/03/15  2:04 PM  Result Value Ref Range   hCG, Beta Chain, Quant, S 35511 (H) <5 mIU/mL  CBC     Status: Abnormal   Collection Time: 03/03/15  2:04 PM  Result Value Ref Range   WBC 8.6 4.0 - 10.5 K/uL   RBC 4.43 3.87 - 5.11 MIL/uL   Hemoglobin 11.6 (L) 12.0 - 15.0 g/dL   HCT 40.9 (L) 81.1 - 91.4 %   MCV 78.3 78.0 - 100.0 fL   MCH 26.2 26.0 - 34.0 pg   MCHC 33.4 30.0 - 36.0 g/dL   RDW 78.2 95.6 - 21.3 %   Platelets 232 150 - 400 K/uL   Ultrasound: FINDINGS: Intrauterine gestational sac: Single ovoid in shape in the fundal region of the endometrial canal.  Yolk sac: Present.  Embryo: Present.  Cardiac Activity: Present.  Heart Rate: 102 bpm  CRL: 2.3 mm 5 w 6 d [place] EDC: 10/28/2015.  Maternal uterus/adnexae: No evidence of subchorionic hemorrhage. Bilateral ovaries are normal in appearance. Trace volume of free fluid in the cul-de-sac.  IMPRESSION: 1. Single viable IUP with estimated gestational age of [redacted] weeks and 6 days. No acute findings. 2. Trace volume of free fluid in the cul-de-sac, presumably physiologic. Assessment and Plan  29 y.o. G4P3003 at [redacted]w[redacted]d IUP Nausea and Vomiting in Pregnancy     Plan: Discharge to home RX Reglan Begin prenatal vitamins Schedule prenatal care appointment  Marlis Edelson, CNM

## 2015-03-04 LAB — HIV ANTIBODY (ROUTINE TESTING W REFLEX): HIV SCREEN 4TH GENERATION: NONREACTIVE

## 2015-03-04 LAB — RPR: RPR Ser Ql: NONREACTIVE

## 2015-03-05 LAB — GC/CHLAMYDIA PROBE AMP (~~LOC~~) NOT AT ARMC
Chlamydia: NEGATIVE
Neisseria Gonorrhea: NEGATIVE

## 2015-05-21 ENCOUNTER — Other Ambulatory Visit (HOSPITAL_COMMUNITY): Payer: Self-pay | Admitting: Nurse Practitioner

## 2015-05-21 DIAGNOSIS — Z3689 Encounter for other specified antenatal screening: Secondary | ICD-10-CM

## 2015-05-31 ENCOUNTER — Ambulatory Visit (HOSPITAL_COMMUNITY)
Admission: RE | Admit: 2015-05-31 | Discharge: 2015-05-31 | Disposition: A | Discharge status: Home / Self Care | Payer: Medicaid Other | Source: Ambulatory Visit | Attending: Nurse Practitioner | Admitting: Nurse Practitioner

## 2015-05-31 ENCOUNTER — Other Ambulatory Visit (HOSPITAL_COMMUNITY): Payer: Self-pay | Admitting: Nurse Practitioner

## 2015-05-31 DIAGNOSIS — Z3A18 18 weeks gestation of pregnancy: Secondary | ICD-10-CM

## 2015-05-31 DIAGNOSIS — Z36 Encounter for antenatal screening of mother: Secondary | ICD-10-CM | POA: Diagnosis not present

## 2015-05-31 DIAGNOSIS — Z3689 Encounter for other specified antenatal screening: Secondary | ICD-10-CM

## 2015-05-31 DIAGNOSIS — O3421 Maternal care for scar from previous cesarean delivery: Secondary | ICD-10-CM | POA: Insufficient documentation

## 2015-05-31 DIAGNOSIS — O34219 Maternal care for unspecified type scar from previous cesarean delivery: Secondary | ICD-10-CM

## 2015-06-11 LAB — OB RESULTS CONSOLE ANTIBODY SCREEN: Antibody Screen: NEGATIVE

## 2015-06-11 LAB — OB RESULTS CONSOLE RPR: RPR: NONREACTIVE

## 2015-06-11 LAB — OB RESULTS CONSOLE GC/CHLAMYDIA
Chlamydia: NEGATIVE
Gonorrhea: NEGATIVE

## 2015-06-11 LAB — OB RESULTS CONSOLE ABO/RH: RH TYPE: POSITIVE

## 2015-06-11 LAB — OB RESULTS CONSOLE HIV ANTIBODY (ROUTINE TESTING): HIV: NONREACTIVE

## 2015-06-11 LAB — OB RESULTS CONSOLE HEPATITIS B SURFACE ANTIGEN: HEP B S AG: NEGATIVE

## 2015-06-11 LAB — OB RESULTS CONSOLE RUBELLA ANTIBODY, IGM: Rubella: IMMUNE

## 2015-09-03 ENCOUNTER — Inpatient Hospital Stay (HOSPITAL_COMMUNITY)
Admission: AD | Admit: 2015-09-03 | Discharge: 2015-09-03 | Disposition: A | Discharge status: Home / Self Care | Payer: Medicaid Other | Source: Ambulatory Visit | Attending: Obstetrics and Gynecology | Admitting: Obstetrics and Gynecology

## 2015-09-03 ENCOUNTER — Encounter (HOSPITAL_COMMUNITY): Payer: Self-pay

## 2015-09-03 DIAGNOSIS — R0602 Shortness of breath: Secondary | ICD-10-CM | POA: Diagnosis present

## 2015-09-03 DIAGNOSIS — Z3A31 31 weeks gestation of pregnancy: Secondary | ICD-10-CM | POA: Insufficient documentation

## 2015-09-03 DIAGNOSIS — O26893 Other specified pregnancy related conditions, third trimester: Secondary | ICD-10-CM | POA: Diagnosis not present

## 2015-09-03 DIAGNOSIS — E162 Hypoglycemia, unspecified: Secondary | ICD-10-CM

## 2015-09-03 LAB — URINALYSIS, ROUTINE W REFLEX MICROSCOPIC
BILIRUBIN URINE: NEGATIVE
GLUCOSE, UA: 100 mg/dL — AB
KETONES UR: 15 mg/dL — AB
Leukocytes, UA: NEGATIVE
Nitrite: NEGATIVE
Protein, ur: NEGATIVE mg/dL
Specific Gravity, Urine: >1.03 — ABNORMAL HIGH (ref 1.005–1.030)
pH: 6 (ref 5.0–8.0)

## 2015-09-03 LAB — GLUCOSE, CAPILLARY
Glucose-Capillary: 60 mg/dL — ABNORMAL LOW (ref 65–99)
Glucose-Capillary: 135 mg/dL — ABNORMAL HIGH (ref 65–99)

## 2015-09-03 LAB — URINE MICROSCOPIC-ADD ON

## 2015-09-03 NOTE — MAU Note (Addendum)
Had gone to work, felt like heart was pounding, couldn't catch her breath, would break in to sweat.  Was contracting (has felt contractions all weekend) and feeling pelvic pressure.

## 2015-09-03 NOTE — Discharge Instructions (Signed)
Hypoglycemia Low blood sugar (hypoglycemia) means that the level of sugar in your blood is lower than it should be. Signs of low blood sugar include:  Getting sweaty.  Feeling hungry.  Feeling dizzy or weak.  Feeling sleepier than normal.  Feeling nervous.  Headaches.  Having a fast heartbeat. Low blood sugar can happen fast and can be an emergency. Your doctor can do tests to check your blood sugar level. You can have low blood sugar and not have diabetes. HOME CARE  Check your blood sugar as told by your doctor. If it is less than 70 mg/dl or as told by your doctor, take 1 of the following:  3 to 4 glucose tablets.   cup clear juice.   cup soda pop, not diet.  1 cup milk.  5 to 6 hard candies.  Recheck blood sugar after 15 minutes. Repeat until it is at the right level.  Eat a snack if it is more than 1 hour until the next meal.  Only take medicine as told by your doctor.  Do not skip meals. Eat on time.  Do not drink alcohol except with meals.  Check your blood glucose before driving.  Check your blood glucose before and after exercise.  Always carry treatment with you, such as glucose pills.  Always wear a medical alert bracelet if you have diabetes. GET HELP RIGHT AWAY IF:   Your blood glucose goes below 70 mg/dl or as told by your doctor, and you:  Are confused.  Are not able to swallow.  Pass out (faint).  You cannot treat yourself. You may need someone to help you.  You have low blood sugar problems often.  You have problems from your medicines.  You are not feeling better after 3 to 4 days.  You have vision changes. MAKE SURE YOU:   Understand these instructions.  Will watch this condition.  Will get help right away if you are not doing well or get worse.   This information is not intended to replace advice given to you by your health care provider. Make sure you discuss any questions you have with your health care provider.     Document Released: 12/03/2009 Document Revised: 09/29/2014 Document Reviewed: 05/15/2015 Elsevier Interactive Patient Education 2016 [employer]  

## 2015-09-03 NOTE — MAU Provider Note (Signed)
Gwendolyn Lynch is a 29 y.o. G4P3 at 32.1 weeks came from teh office with c/o sob, sweating and ctx q5 per Gerrit HeckJessica Emly.  Pt report this started Saturday.  Pt denies vb or lof w/+FM.  Report she last ate at 0815 today.      History     There are no active problems to display for this patient.   Chief Complaint  Patient presents with  . Contractions   HPI  OB History    Gravida Para Term Preterm AB TAB SAB Ectopic Multiple Living   4 3 3       3       Past Medical History  Diagnosis Date  . Hemorrhoids     Past Surgical History  Procedure Laterality Date  . Cesarean section      Family History  Problem Relation Age of Onset  . Diabetes Mother   . Hypertension Mother   . Diabetes Father   . Hypertension Father     Social History  Substance Use Topics  . Smoking status: Never Smoker   . Smokeless tobacco: Never Used  . Alcohol Use: No    Allergies: No Known Allergies  Prescriptions prior to admission  Medication Sig Dispense Refill Last Dose  . metoCLOPramide (REGLAN) 10 MG tablet Take 1 tablet (10 mg total) by mouth 3 (three) times daily with meals. 90 tablet 1     ROS See HPI above, all other systems are negative  Physical Exam   Blood pressure 110/61, pulse 89, temperature 97.9 F (36.6 C), temperature source Oral, resp. rate 18, height 4\' 10"  (1.473 m), weight 159 lb (72.122 kg), last menstrual period 01/28/2015, SpO2 98 %.  Physical Exam Ext:  WNL ABD: Soft, non tender to palpation, no rebound or guarding SVE: C/C/H   ED Course  Assessment: IUP at  31.1weeks Membranes: intact FHR: Category 1 CTX:  occasional CBG 60   Plan: Juice, crackers or lunch Recheck in 1 hour   Meldrick Buttery, CNM, MSN 09/03/2015. 11:52 AM     MAU Addendum Note  Results for orders placed or performed during the hospital encounter of 09/03/15 (from the past 24 hour(s))  Glucose, capillary     Status: Abnormal   Collection Time: 09/03/15 11:54 AM  Result  Value Ref Range   Glucose-Capillary 60 (L) 65 - 99 mg/dL  Glucose, capillary     Status: Abnormal   Collection Time: 09/03/15 12:55 PM  Result Value Ref Range   Glucose-Capillary 135 (H) 65 - 99 mg/dL     Plan: -Discussed need to follow up in office in 1 week -Bleeding and PTL Precautions -Encouraged to call if any questions or concerns arise prior to next scheduled office visit.  -Discharged to home in stable condition     Felder Lebeda, CNM, MSN 09/03/2015. 1:01 PM

## 2015-09-27 LAB — OB RESULTS CONSOLE GBS: STREP GROUP B AG: POSITIVE

## 2015-10-23 ENCOUNTER — Encounter (HOSPITAL_COMMUNITY): Payer: Self-pay | Admitting: *Deleted

## 2015-10-23 ENCOUNTER — Inpatient Hospital Stay (HOSPITAL_COMMUNITY)
Admission: AD | Admit: 2015-10-23 | Discharge: 2015-10-23 | Disposition: A | Discharge status: Home / Self Care | Payer: Medicaid Other | Source: Ambulatory Visit | Attending: Obstetrics and Gynecology | Admitting: Obstetrics and Gynecology

## 2015-10-23 DIAGNOSIS — O26893 Other specified pregnancy related conditions, third trimester: Secondary | ICD-10-CM | POA: Diagnosis not present

## 2015-10-23 DIAGNOSIS — Z3A39 39 weeks gestation of pregnancy: Secondary | ICD-10-CM | POA: Insufficient documentation

## 2015-10-23 DIAGNOSIS — N898 Other specified noninflammatory disorders of vagina: Secondary | ICD-10-CM | POA: Insufficient documentation

## 2015-10-23 LAB — AMNISURE RUPTURE OF MEMBRANE (ROM) NOT AT ARMC: Amnisure ROM: NEGATIVE

## 2015-10-23 LAB — OB RESULTS CONSOLE GBS: STREP GROUP B AG: POSITIVE

## 2015-10-23 NOTE — MAU Provider Note (Signed)
Nerissa Constantin Thaxton is a 30 y.o. G4P3003 at 39.5 weeks pt c/o lof   History     There are no active problems to display for this patient.   Chief Complaint  Patient presents with  . Vaginal Discharge   HPI  OB History    Gravida Para Term Preterm AB TAB SAB Ectopic Multiple Living   Past Medical History  Diagnosis Date  . Hemorrhoids     Past Surgical History  Procedure Laterality Date  . Cesarean section      X2    Family History  Problem Relation Age of Onset  . Diabetes Mother   . Hypertension Mother   . Diabetes Father   . Hypertension Father     Social History  Substance Use Topics  . Smoking status: Never Smoker   . Smokeless tobacco: Never Used  . Alcohol Use: No    Allergies: No Known Allergies  Prescriptions prior to admission  Medication Sig Dispense Refill Last Dose  . Prenatal Vit-Fe Fumarate-FA (PRENATAL MULTIVITAMIN) TABS tablet Take 1 tablet by mouth daily at 12 noon.   10/23/2015 at Unknown time    ROS See HPI above, all other systems are negative  Physical Exam   Blood pressure 112/72, pulse 94, temperature 98.1 F (36.7 C), temperature source Oral, resp. rate 16, height  (1.473 m), weight 164 lb (74.39 kg), last menstrual period 01/28/2015.  Physical Exam Ext:  WNL ABD: Soft, non tender to palpation, no rebound or guarding SVE: deferred  ED Course  Assessment: IUP at  39.5weeks Membranes: FHR: Category 1 CTX:  none   Plan:  Labs: [employer], CNM, MSN 10/23/2015. 5:03 PM  MAU Addendum Note  Results for orders placed or performed during the hospital encounter of 10/23/15 (from the past 24 hour(s))  OB RESULT CONSOLE Group B Strep     Status: None   Collection Time: 10/23/15 12:00 AM  Result Value Ref Range   GBS Positive   Amnisure rupture of membrane (rom)not at [facility]     Status: None   Collection Time: 10/23/15  4:35 PM  Result Value Ref Range   Amnisure ROM NEGATIVE       Plan: -Discussed need to follow up in office on next weeks appointment -Bleeding and labor Precautions -Encouraged to call if any questions or concerns arise prior to next scheduled office visit.  -Discharged to home in stable condition -kick counts   Kasra Melvin, CNM, MSN 10/23/2015. 5:05 PM

## 2015-10-23 NOTE — MAU Note (Signed)
"  leaking for no reason", clear fluid - still coming, started around 10. No bleeding, no pain.

## 2015-10-30 ENCOUNTER — Encounter (HOSPITAL_COMMUNITY): Payer: Self-pay | Admitting: *Deleted

## 2015-10-30 ENCOUNTER — Telehealth (HOSPITAL_COMMUNITY): Payer: Self-pay | Admitting: *Deleted

## 2015-10-30 NOTE — Telephone Encounter (Signed)
Preadmission screen  

## 2015-11-01 ENCOUNTER — Encounter (HOSPITAL_COMMUNITY)
Admission: AD | Disposition: A | Discharge status: Home / Self Care | Payer: Self-pay | Source: Ambulatory Visit | Attending: Obstetrics and Gynecology

## 2015-11-01 ENCOUNTER — Inpatient Hospital Stay (HOSPITAL_COMMUNITY): Payer: Medicaid Other | Admitting: Anesthesiology

## 2015-11-01 ENCOUNTER — Inpatient Hospital Stay (HOSPITAL_COMMUNITY)
Admission: AD | Admit: 2015-11-01 | Discharge: 2015-11-03 | DRG: 766 | Disposition: A | Discharge status: Home / Self Care | Payer: Medicaid Other | Source: Ambulatory Visit | Attending: Obstetrics and Gynecology | Admitting: Obstetrics and Gynecology

## 2015-11-01 ENCOUNTER — Encounter (HOSPITAL_COMMUNITY): Payer: Self-pay | Admitting: *Deleted

## 2015-11-01 DIAGNOSIS — O99824 Streptococcus B carrier state complicating childbirth: Secondary | ICD-10-CM | POA: Diagnosis present

## 2015-11-01 DIAGNOSIS — Z3A40 40 weeks gestation of pregnancy: Secondary | ICD-10-CM

## 2015-11-01 DIAGNOSIS — Z833 Family history of diabetes mellitus: Secondary | ICD-10-CM

## 2015-11-01 DIAGNOSIS — O34211 Maternal care for low transverse scar from previous cesarean delivery: Secondary | ICD-10-CM | POA: Diagnosis present

## 2015-11-01 DIAGNOSIS — Z98891 History of uterine scar from previous surgery: Secondary | ICD-10-CM

## 2015-11-01 DIAGNOSIS — Z8249 Family history of ischemic heart disease and other diseases of the circulatory system: Secondary | ICD-10-CM | POA: Diagnosis not present

## 2015-11-01 LAB — CBC
HCT: 29.2 % — ABNORMAL LOW (ref 36.0–46.0)
Hemoglobin: 9.6 g/dL — ABNORMAL LOW (ref 12.0–15.0)
MCH: 24.9 pg — AB (ref 26.0–34.0)
MCHC: 32.9 g/dL (ref 30.0–36.0)
MCV: 75.6 fL — ABNORMAL LOW (ref 78.0–100.0)
PLATELETS: 245 10*3/uL (ref 150–400)
RBC: 3.86 MIL/uL — AB (ref 3.87–5.11)
RDW: 16.2 % — ABNORMAL HIGH (ref 11.5–15.5)
WBC: 10.4 10*3/uL (ref 4.0–10.5)

## 2015-11-01 LAB — TYPE AND SCREEN
ABO/RH(D): O POS
Antibody Screen: NEGATIVE

## 2015-11-01 SURGERY — Surgical Case
Anesthesia: Spinal

## 2015-11-01 MED ORDER — DIBUCAINE 1 % RE OINT
1.0000 | TOPICAL_OINTMENT | RECTAL | Status: DC | PRN
Start: 2015-11-01 — End: 2015-11-03
  Administered 2015-11-02: 1 via RECTAL
  Filled 2015-11-01: qty 28

## 2015-11-01 MED ORDER — ACETAMINOPHEN 325 MG PO TABS
650.0000 mg | ORAL_TABLET | ORAL | Status: DC | PRN
  Administered 2015-11-03: 650 mg via ORAL
  Filled 2015-11-01: qty 2

## 2015-11-01 MED ORDER — NALOXONE HCL 2 MG/2ML IJ SOSY: 1.0000 ug/kg/h | PREFILLED_SYRINGE | INTRAVENOUS | Status: DC | PRN

## 2015-11-01 MED ORDER — NALBUPHINE HCL 10 MG/ML IJ SOLN: 5.0000 mg | Freq: Once | INTRAMUSCULAR | Status: DC | PRN

## 2015-11-01 MED ORDER — DIPHENHYDRAMINE HCL 25 MG PO CAPS: 25.0000 mg | ORAL_CAPSULE | ORAL | Status: DC | PRN

## 2015-11-01 MED ORDER — SCOPOLAMINE 1 MG/3DAYS TD PT72
MEDICATED_PATCH | TRANSDERMAL | Status: AC
  Filled 2015-11-01: qty 1

## 2015-11-01 MED ORDER — IBUPROFEN 600 MG PO TABS
600.0000 mg | ORAL_TABLET | Freq: Four times a day (QID) | ORAL | Status: DC
  Administered 2015-11-02 (×2): 600 mg via ORAL
  Filled 2015-11-01 (×3): qty 1

## 2015-11-01 MED ORDER — MORPHINE SULFATE (PF) 0.5 MG/ML IJ SOLN
INTRAMUSCULAR | Status: AC
  Filled 2015-11-01: qty 10

## 2015-11-01 MED ORDER — LANOLIN HYDROUS EX OINT: 1.0000 "application " | TOPICAL_OINTMENT | CUTANEOUS | Status: DC | PRN

## 2015-11-01 MED ORDER — FENTANYL CITRATE (PF) 100 MCG/2ML IJ SOLN
INTRAMUSCULAR | Status: DC | PRN
  Administered 2015-11-01: 10 ug via INTRATHECAL

## 2015-11-01 MED ORDER — MORPHINE SULFATE (PF) 0.5 MG/ML IJ SOLN
INTRAMUSCULAR | Status: DC | PRN
  Administered 2015-11-01: .2 mg via INTRATHECAL

## 2015-11-01 MED ORDER — ZOLPIDEM TARTRATE 5 MG PO TABS: 5.0000 mg | ORAL_TABLET | Freq: Every evening | ORAL | Status: DC | PRN

## 2015-11-01 MED ORDER — FAMOTIDINE IN NACL 20-0.9 MG/50ML-% IV SOLN
20.0000 mg | Freq: Once | INTRAVENOUS | Status: AC
  Administered 2015-11-01: 20 mg via INTRAVENOUS
  Filled 2015-11-01: qty 50

## 2015-11-01 MED ORDER — SENNOSIDES-DOCUSATE SODIUM 8.6-50 MG PO TABS
2.0000 | ORAL_TABLET | ORAL | Status: DC
  Administered 2015-11-02 (×2): 2 via ORAL
  Filled 2015-11-01 (×2): qty 2

## 2015-11-01 MED ORDER — MEPERIDINE HCL 25 MG/ML IJ SOLN: 6.2500 mg | INTRAMUSCULAR | Status: DC | PRN

## 2015-11-01 MED ORDER — TETANUS-DIPHTH-ACELL PERTUSSIS 5-2.5-18.5 LF-MCG/0.5 IM SUSP: 0.5000 mL | Freq: Once | INTRAMUSCULAR | Status: DC

## 2015-11-01 MED ORDER — SODIUM CHLORIDE 0.9 % IR SOLN
Status: DC | PRN
  Administered 2015-11-01: 1

## 2015-11-01 MED ORDER — NALOXONE HCL 0.4 MG/ML IJ SOLN: 0.4000 mg | INTRAMUSCULAR | Status: DC | PRN

## 2015-11-01 MED ORDER — DIPHENHYDRAMINE HCL 50 MG/ML IJ SOLN: 12.5000 mg | INTRAMUSCULAR | Status: DC | PRN

## 2015-11-01 MED ORDER — LACTATED RINGERS IV SOLN
INTRAVENOUS | Status: DC
  Administered 2015-11-01 (×3): via INTRAVENOUS

## 2015-11-01 MED ORDER — MEASLES, MUMPS & RUBELLA VAC ~~LOC~~ INJ: 0.5000 mL | INJECTION | Freq: Once | SUBCUTANEOUS | Status: DC

## 2015-11-01 MED ORDER — NALBUPHINE HCL 10 MG/ML IJ SOLN: 5.0000 mg | INTRAMUSCULAR | Status: DC | PRN

## 2015-11-01 MED ORDER — ONDANSETRON HCL 4 MG/2ML IJ SOLN
INTRAMUSCULAR | Status: AC
  Filled 2015-11-01: qty 2

## 2015-11-01 MED ORDER — CITRIC ACID-SODIUM CITRATE 334-500 MG/5ML PO SOLN
30.0000 mL | Freq: Once | ORAL | Status: AC
  Administered 2015-11-01: 30 mL via ORAL
  Filled 2015-11-01: qty 15

## 2015-11-01 MED ORDER — PRENATAL MULTIVITAMIN CH: 1.0000 | ORAL_TABLET | Freq: Every day | ORAL | Status: DC

## 2015-11-01 MED ORDER — SODIUM CHLORIDE 0.9% FLUSH: 3.0000 mL | INTRAVENOUS | Status: DC | PRN

## 2015-11-01 MED ORDER — BISACODYL 10 MG RE SUPP: 10.0000 mg | Freq: Every day | RECTAL | Status: DC | PRN

## 2015-11-01 MED ORDER — LACTATED RINGERS IV SOLN
INTRAVENOUS | Status: DC
  Administered 2015-11-02: via INTRAVENOUS

## 2015-11-01 MED ORDER — DIPHENHYDRAMINE HCL 25 MG PO CAPS: 25.0000 mg | ORAL_CAPSULE | Freq: Four times a day (QID) | ORAL | Status: DC | PRN

## 2015-11-01 MED ORDER — SIMETHICONE 80 MG PO CHEW: 80.0000 mg | CHEWABLE_TABLET | ORAL | Status: DC | PRN

## 2015-11-01 MED ORDER — SCOPOLAMINE 1 MG/3DAYS TD PT72
MEDICATED_PATCH | TRANSDERMAL | Status: DC | PRN
  Administered 2015-11-01: 1 via TRANSDERMAL

## 2015-11-01 MED ORDER — MENTHOL 3 MG MT LOZG: 1.0000 | LOZENGE | OROMUCOSAL | Status: DC | PRN

## 2015-11-01 MED ORDER — SIMETHICONE 80 MG PO CHEW
80.0000 mg | CHEWABLE_TABLET | ORAL | Status: DC
  Administered 2015-11-02: 80 mg via ORAL
  Filled 2015-11-01 (×2): qty 1

## 2015-11-01 MED ORDER — WITCH HAZEL-GLYCERIN EX PADS
1.0000 "application " | MEDICATED_PAD | CUTANEOUS | Status: DC | PRN
  Administered 2015-11-02: 1 via TOPICAL

## 2015-11-01 MED ORDER — BUPIVACAINE IN DEXTROSE 0.75-8.25 % IT SOLN
INTRATHECAL | Status: DC | PRN
  Administered 2015-11-01: 1.6 mL via INTRATHECAL

## 2015-11-01 MED ORDER — FENTANYL CITRATE (PF) 100 MCG/2ML IJ SOLN: INTRAMUSCULAR | Status: DC | PRN

## 2015-11-01 MED ORDER — OXYTOCIN 10 UNIT/ML IJ SOLN
INTRAMUSCULAR | Status: AC
  Filled 2015-11-01: qty 4

## 2015-11-01 MED ORDER — LACTATED RINGERS IV BOLUS (SEPSIS)
1000.0000 mL | Freq: Once | INTRAVENOUS | Status: AC
  Administered 2015-11-01: 1000 mL via INTRAVENOUS

## 2015-11-01 MED ORDER — OXYTOCIN 10 UNIT/ML IJ SOLN
40.0000 [IU] | INTRAMUSCULAR | Status: DC | PRN
  Administered 2015-11-01: 40 [IU] via INTRAVENOUS

## 2015-11-01 MED ORDER — PHENYLEPHRINE 8 MG IN D5W 100 ML (0.08MG/ML) PREMIX OPTIME
INJECTION | INTRAVENOUS | Status: DC | PRN
  Administered 2015-11-01: 80 ug/min via INTRAVENOUS

## 2015-11-01 MED ORDER — ACETAMINOPHEN 500 MG PO TABS
1000.0000 mg | ORAL_TABLET | Freq: Four times a day (QID) | ORAL | Status: AC
  Administered 2015-11-02 (×2): 1000 mg via ORAL
  Filled 2015-11-01 (×2): qty 2

## 2015-11-01 MED ORDER — CEFAZOLIN SODIUM-DEXTROSE 2-3 GM-% IV SOLR
2.0000 g | INTRAVENOUS | Status: AC
  Administered 2015-11-01: 2 g via INTRAVENOUS
  Filled 2015-11-01: qty 50

## 2015-11-01 MED ORDER — ONDANSETRON HCL 4 MG/2ML IJ SOLN
INTRAMUSCULAR | Status: DC | PRN
  Administered 2015-11-01: 4 mg via INTRAVENOUS

## 2015-11-01 MED ORDER — PRENATAL MULTIVITAMIN CH
1.0000 | ORAL_TABLET | Freq: Every day | ORAL | Status: DC
  Filled 2015-11-01: qty 1

## 2015-11-01 MED ORDER — PHENYLEPHRINE 8 MG IN D5W 100 ML (0.08MG/ML) PREMIX OPTIME
INJECTION | INTRAVENOUS | Status: AC
  Filled 2015-11-01: qty 100

## 2015-11-01 MED ORDER — FENTANYL CITRATE (PF) 100 MCG/2ML IJ SOLN
INTRAMUSCULAR | Status: AC
  Filled 2015-11-01: qty 2

## 2015-11-01 MED ORDER — FLEET ENEMA 7-19 GM/118ML RE ENEM: 1.0000 | ENEMA | Freq: Every day | RECTAL | Status: DC | PRN

## 2015-11-01 MED ORDER — PROMETHAZINE HCL 25 MG/ML IJ SOLN: 6.2500 mg | INTRAMUSCULAR | Status: DC | PRN

## 2015-11-01 MED ORDER — OXYTOCIN 10 UNIT/ML IJ SOLN: 2.5000 [IU]/h | INTRAVENOUS | Status: AC

## 2015-11-01 MED ORDER — ONDANSETRON HCL 4 MG/2ML IJ SOLN: 4.0000 mg | Freq: Three times a day (TID) | INTRAMUSCULAR | Status: DC | PRN

## 2015-11-01 MED ORDER — LACTATED RINGERS IV SOLN
INTRAVENOUS | Status: DC | PRN
  Administered 2015-11-01: 18:00:00 via INTRAVENOUS

## 2015-11-01 MED ORDER — SIMETHICONE 80 MG PO CHEW
80.0000 mg | CHEWABLE_TABLET | Freq: Three times a day (TID) | ORAL | Status: DC
  Administered 2015-11-02 – 2015-11-03 (×5): 80 mg via ORAL
  Filled 2015-11-01 (×4): qty 1

## 2015-11-01 MED ORDER — FERROUS SULFATE 325 (65 FE) MG PO TABS
325.0000 mg | ORAL_TABLET | Freq: Two times a day (BID) | ORAL | Status: DC
  Administered 2015-11-02 – 2015-11-03 (×3): 325 mg via ORAL
  Filled 2015-11-01 (×3): qty 1

## 2015-11-01 SURGICAL SUPPLY — 47 items
APL SKNCLS STERI-STRIP NONHPOA (GAUZE/BANDAGES/DRESSINGS) ×1
BENZOIN TINCTURE PRP APPL 2/3 (GAUZE/BANDAGES/DRESSINGS) ×3 IMPLANT
CLAMP CORD UMBIL (MISCELLANEOUS) IMPLANT
CLOSURE STERI STRIP 1/2 X4 (GAUZE/BANDAGES/DRESSINGS) ×1 IMPLANT
CLOSURE WOUND 1/2 X4 (GAUZE/BANDAGES/DRESSINGS) ×1
CLOTH BEACON ORANGE TIMEOUT ST (SAFETY) ×3 IMPLANT
CONTAINER PREFILL 10% NBF 15ML (MISCELLANEOUS) IMPLANT
DEVICE BLD TRNS LUER ATTCH (MISCELLANEOUS) ×2 IMPLANT
DRAIN JACKSON PRT FLT 10 (DRAIN) IMPLANT
DRSG OPSITE POSTOP 4X10 (GAUZE/BANDAGES/DRESSINGS) ×3 IMPLANT
DURAPREP 26ML APPLICATOR (WOUND CARE) ×3 IMPLANT
ELECT REM PT RETURN 9FT ADLT (ELECTROSURGICAL) ×3
ELECTRODE REM PT RTRN 9FT ADLT (ELECTROSURGICAL) ×1 IMPLANT
EVACUATOR SILICONE 100CC (DRAIN) IMPLANT
EXTRACTOR VACUUM M CUP 4 TUBE (SUCTIONS) IMPLANT
EXTRACTOR VACUUM M CUP 4' TUBE (SUCTIONS)
GLOVE BIO SURGEON STRL SZ 6.5 (GLOVE) ×2 IMPLANT
GLOVE BIO SURGEONS STRL SZ 6.5 (GLOVE) ×1
GLOVE BIOGEL PI IND STRL 7.0 (GLOVE) ×2 IMPLANT
GLOVE BIOGEL PI INDICATOR 7.0 (GLOVE) ×4
GOWN STRL REUS W/TWL LRG LVL3 (GOWN DISPOSABLE) ×6 IMPLANT
HEMOSTAT SURGICEL 4X8 (HEMOSTASIS) ×2 IMPLANT
KIT ABG SYR 3ML LUER SLIP (SYRINGE) IMPLANT
NDL HYPO 25X5/8 SAFETYGLIDE (NEEDLE) IMPLANT
NEEDLE HYPO 25X5/8 SAFETYGLIDE (NEEDLE) IMPLANT
NS IRRIG 1000ML POUR BTL (IV SOLUTION) ×3 IMPLANT
PACK C SECTION WH (CUSTOM PROCEDURE TRAY) ×3 IMPLANT
PAD ABD 7.5X8 STRL (GAUZE/BANDAGES/DRESSINGS) ×2 IMPLANT
PAD OB MATERNITY 4.3X12.25 (PERSONAL CARE ITEMS) ×3 IMPLANT
PENCIL SMOKE EVAC W/HOLSTER (ELECTROSURGICAL) ×3 IMPLANT
RTRCTR C-SECT PINK 25CM LRG (MISCELLANEOUS) IMPLANT
SPONGE GAUZE 4X4 12PLY STER LF (GAUZE/BANDAGES/DRESSINGS) ×4 IMPLANT
STRIP CLOSURE SKIN 1/2X4 (GAUZE/BANDAGES/DRESSINGS) ×2 IMPLANT
SUT CHROMIC 0 CT 1 (SUTURE) ×3 IMPLANT
SUT MNCRL AB 3-0 PS2 27 (SUTURE) ×3 IMPLANT
SUT PLAIN 2 0 (SUTURE) ×6
SUT PLAIN 2 0 XLH (SUTURE) ×3 IMPLANT
SUT PLAIN ABS 2-0 CT1 27XMFL (SUTURE) ×2 IMPLANT
SUT SILK 2 0 SH (SUTURE) IMPLANT
SUT VIC AB 0 CTX 36 (SUTURE) ×15
SUT VIC AB 0 CTX36XBRD ANBCTRL (SUTURE) ×4 IMPLANT
SUT VIC AB 2-0 CT1 27 (SUTURE)
SUT VIC AB 2-0 CT1 TAPERPNT 27 (SUTURE) IMPLANT
SUT VIC AB 2-0 SH 27 (SUTURE) ×9
SUT VIC AB 2-0 SH 27XBRD (SUTURE) IMPLANT
TOWEL OR 17X24 6PK STRL BLUE (TOWEL DISPOSABLE) ×3 IMPLANT
TRAY FOLEY CATH SILVER 14FR (SET/KITS/TRAYS/PACK) ×3 IMPLANT

## 2015-11-01 NOTE — Op Note (Signed)
Cesarean Section Procedure Note   Gwendolyn Lynch  11/01/2015  Indications: h/o CS TIMES TWO.  OCC LATE DECEL ON MONITORING   Pre-operative Diagnosis: CESAREAN SECTION repeat.   Post-operative Diagnosis: Same   Surgeon: Surgeon(s) and Role:    * Jaymes Graff, MD - Primary   Assistants: Manfred Arch CNM   Anesthesia: spinal   Procedure Details:  The patient was seen in the Holding Room. The risks, benefits, complications, treatment options, and expected outcomes were discussed with the patient. The patient concurred with the proposed plan, giving informed consent. identified as Nicole Defino [employer]op[employer] and the procedure verified as C-Section Delivery. A Time Out was held and the above information confirmed.  After induction of anesthesia, the patient was draped and prepped in the usual sterile manner. A transverse incision was made and carried down through the subcutaneous tissue to the fascia. Fascial incision was made in the midline and extended transversely. The fascia was separated from the underlying rectus muscle superiorly and inferiorly. The peritoneum was identified and entered. Peritoneal incision was extended longitudinally with good visualization of bowel and bladder. The utero-vesical peritoneal reflection was incised transversely and the bladder flap was bluntly freed from the lower uterine segment.  The lower uterine segment was paper thin. Only serosa was seen covering the amniotic sac.  C/w with a window  A low transverse uterine incision was made. Delivered from cephalic presentation was a  infant, with Apgar scores of 8 at one minute and 9 at five minutes. Cord ph was not sent the umbilical cord was clamped and cut cord blood was obtained for evaluation. The placenta was removed Intact and appeared normal. The uterine outline, tubes and ovaries appeared normal}. The uterine incision was closed with running locked sutures of 0Vicryl. A second layer 0 vicrlyl was used to imbricate the  uterine incision.      Hemostasis was observed. Lavage was carried out until clear. The alexsis was removed.  The peritoneum was closed with 0 chromic.  The muscles were examined and any bleeders were made hemostatic using bovie cautery device.   The fascia was then reapproximated with running sutures of 0 vicryl.  The subcutaneous tissue was reapproximated  With interrupted stitches using 2-0 plain gut. The subcuticular closure was performed using 3-40monocryl     Instrument, sponge, and needle counts were correct prior the abdominal closure and were correct at the conclusion of the case.    Findings: infant was delivered from VTX presentation. The fluid was CLEAR.  The uterus tubes and ovaries appeared normal.     Estimated Blood Loss:   Total IV Fluids:   Urine Output: 400CC OF clear urine  Specimens: PLACENTA TO PATH  Complications: no complications  Disposition: PACU - hemodynamically stable.   Maternal Condition: stable   Baby condition / location:  Couplet care / Skin to Skin  Attending Attestation: I performed the procedure.   Signed: Surgeon(s): Jaymes Graff, MD

## 2015-11-01 NOTE — Progress Notes (Signed)
[facility]   Delivery Note:  C-section       11/01/2015  6:00 PM  I was called to the operating room at the request of the patient's obstetrician (Dr. Dillard) for a repeat c-section.  PRENATAL HX:  This is a 29 y/o G4P3003 at 40 and 47 weeks who had been planning a VBAC but presented to MAU for prolonged monitoring for late decelerations, and mother decided to deliver by repeat c-section.    INTRAPARTUM HX:   Repeat c-section with AROM at delivery  DELIVERY:  Infant was vigorous at delivery, requiring no resuscitation other than standard warming, drying and stimulation.  APGARs 8 and 9.  Exam within normal limits.  After 5 minutes, baby left with nurse to assist parents with skin-to-skin care.   _____________________ Electronically Signed By: Miller Limehouse, MD Neonatologist    

## 2015-11-01 NOTE — MAU Note (Signed)
Dr. Normand Sloop in to discuss options & plan of care with pt.

## 2015-11-01 NOTE — Transfer of Care (Signed)
Immediate Anesthesia Transfer of Care Note  Patient: Gwendolyn Lynch  Procedure(s) Performed: Procedure(s): CESAREAN SECTION (N/A)  Patient Location: PACU  Anesthesia Type:Spinal  Level of Consciousness: awake, alert  and oriented  Airway & Oxygen Therapy: Patient Spontanous Breathing  Post-op Assessment: Report given to RN and Post -op Vital signs reviewed and stable  Post vital signs: Reviewed and stable  Last Vitals:  Filed Vitals:   11/01/15 1148  BP: 129/68  Pulse: 97  Temp: 36.7 C  Resp: 18    Complications: No apparent anesthesia complications

## 2015-11-01 NOTE — H&P (Signed)
Gwendolyn Lynch is a 30 y.o. female, G4P3003 at 22 4/7 weeks, presented to MAU for prolonged montitoring because of subtle late decels.    There are no active problems to display for this patient.   History of present pregnancy: Patient entered care at 20 weeks in transfer from [facility]. EDC of 10/28/15 was established by LMP   Anatomy:  At prior office, with normal findings per patient report.    Additional [place] evaluations: Today--US BPP 8/8, normal fluid   Significant prenatal events:   Last evaluation:  Today at office with NST showing possible late decel.  OB History    Gravida Para Term Preterm AB TAB SAB Ectopic Multiple Living   2005--SVB, 40 weeks, 5+8, female, epidural 2009--primary LTCS due to FTP, 40 weeks, 5+11, female, epidural, Dr. Tamela Lynch 2012--repeat LTCS due to FTP, 39 weeks, 6 lbs, spinal, delivered with [facility]  Past Medical History  Diagnosis Date  . Hemorrhoids    Past Surgical History  Procedure Laterality Date  . Cesarean section      X2   Family History: family history includes Diabetes in her father and mother; Hypertension in her father and mother.   Social History:  reports that she has never smoked. She has never used smokeless tobacco. She reports that she does not drink alcohol. Her drug history is not on file.   Prenatal Transfer Tool  Maternal Diabetes: No Genetic Screening: Normal Maternal Ultrasounds/Referrals: Normal Fetal Ultrasounds or other Referrals:  None Maternal Substance Abuse:  No Significant Maternal Medications:  None Significant Maternal Lab Results: None  TDAP 11/16 Flu 11/16  ROS:  unremarkable  No Known Allergies   Dilation: Fingertip Effacement (%): 50 Station: -3 Exam by:: Dr. Normand Lynch Blood pressure 129/68, pulse 97, temperature 98 F (36.7 C), temperature source Oral, resp. rate 18, height  (1.473 m), weight 76.023 kg (167 lb 9.6 oz), last menstrual period 01/28/2015.  Chest  clear Heart RRR without murmur Abd gravid, NT, FH 39 cm Pelvic: As above Ext: WNL  FHR: Category 1--earlier segment with variables and ? Late decel UCs:  Very occasional  Prenatal labs: ABO, Rh: O/Positive/-- (09/19 0000) Antibody: Negative (09/19 0000) Rubella:  !Error! RPR: Nonreactive (09/19 0000)  HBsAg: Negative (09/19 0000)  HIV: Non-reactive (09/19 0000)  GBS: Positive (01/31 0000) Sickle cell/Hgb electrophoresis:  nml Pap:  wnl GC:  neg Chlamydia:  neg Genetic screenings:  WNL Glucola:  WNL Other:   Hgb 9.9 at NOB,  at 28 weeks       Assessment/Plan: IUP at 40 4?7 weeks Previous C/S x 2, desires repeat Equivocal FHR tracing today, overall reassuring now GBS positive  Plan: Admit to [facility] per consult with Dr. Rosanne Lynch repeat C/S at 5pm Routine pre-op orders   Gwendolyn Lynch, [place] 11/01/2015, 4:10 PM

## 2015-11-01 NOTE — MAU Note (Signed)
Pt sent for prolonged monitoring and BPP. Had decel in office

## 2015-11-01 NOTE — Anesthesia Postprocedure Evaluation (Signed)
Anesthesia Post Note  Patient: Gwendolyn Lynch  Procedure(s) Performed: Procedure(s) (LRB): CESAREAN SECTION (N/A)  Patient location during evaluation: PACU Anesthesia Type: Spinal Level of consciousness: oriented and awake and alert Pain management: pain level controlled Vital Signs Assessment: post-procedure vital signs reviewed and stable Respiratory status: spontaneous breathing, respiratory function stable and patient connected to nasal cannula oxygen Cardiovascular status: blood pressure returned to baseline and stable Postop Assessment: no headache, no backache, spinal receding, patient able to bend at knees, no signs of nausea or vomiting and adequate PO intake Anesthetic complications: no    Last Vitals:  Filed Vitals:   11/01/15 1930 11/01/15 1945  BP: 88/36 103/74  Pulse: 72 71  Temp: 36.6 C   Resp: 15 14    Last Pain:  Filed Vitals:   11/01/15 1949  PainSc: 0-No pain    LLE Motor Response: Purposeful movement (11/01/15 1945)   RLE Motor Response: Purposeful movement (11/01/15 1945)   L Sensory Level: L2-Upper inner thigh, upper buttock (11/01/15 1945) R Sensory Level: L2-Upper inner thigh, upper buttock (11/01/15 1945)  Cecile Hearing

## 2015-11-01 NOTE — Anesthesia Preprocedure Evaluation (Signed)
Anesthesia Evaluation  Patient identified by MRN, date of birth, ID band Patient awake    Reviewed: Allergy & Precautions, NPO status , Patient's Chart, lab work & pertinent test results  History of Anesthesia Complications Negative for: history of anesthetic complications  Airway Mallampati: II  TM Distance: >3 FB Neck ROM: Full    Dental  (+) Teeth Intact, Dental Advisory Given   Pulmonary neg pulmonary ROS,    Pulmonary exam normal breath sounds clear to auscultation       Cardiovascular negative cardio ROS Normal cardiovascular exam Rhythm:Regular Rate:Normal     Neuro/Psych negative neurological ROS  negative psych ROS   GI/Hepatic negative GI ROS, Neg liver ROS,   Endo/Other  negative endocrine ROS  Renal/GU negative Renal ROS     Musculoskeletal negative musculoskeletal ROS (+)   Abdominal   Peds  Hematology  (+) Blood dyscrasia, anemia , Plt 245k   Anesthesia Other Findings Day of surgery medications reviewed with the patient.  Reproductive/Obstetrics (+) Pregnancy                             Anesthesia Physical Anesthesia Plan  ASA: II  Anesthesia Plan: Spinal   Post-op Pain Management:    Induction:   Airway Management Planned:   Additional Equipment:   Intra-op Plan:   Post-operative Plan:   Informed Consent: I have reviewed the patients History and Physical, chart, labs and discussed the procedure including the risks, benefits and alternatives for the proposed anesthesia with the patient or authorized representative who has indicated his/her understanding and acceptance.   Dental advisory given  Plan Discussed with: CRNA  Anesthesia Plan Comments: (Discussed risks and benefits of and differences between spinal and general. Discussed risks of spinal including headache, backache, failure, bleeding, infection, and nerve damage. Patient consents to spinal.  Questions answered. Coagulation studies and platelet count acceptable.  Last ate peanuts at 1030am.  Will wait 6 hours before proceeding with C-section.)        Anesthesia Quick Evaluation

## 2015-11-01 NOTE — Anesthesia Procedure Notes (Signed)
Spinal Patient location during procedure: OR Staffing Anesthesiologist: Carlen Fils EDWARD Performed by: anesthesiologist  Preanesthetic Checklist Completed: patient identified, surgical consent, pre-op evaluation, timeout performed, IV checked, risks and benefits discussed and monitors and equipment checked Spinal Block Patient position: sitting Prep: site prepped and draped and DuraPrep Patient monitoring: continuous pulse ox and blood pressure Approach: midline Location: L3-4 Needle Needle type: Pencan  Needle gauge: 24 G Assessment Sensory level: T4 Additional Notes Functioning IV was confirmed and monitors were applied. Sterile prep and drape, including hand hygiene, mask and sterile gloves were used. The patient was positioned and the spine was prepped. The skin was anesthetized with lidocaine.  Free flow of clear CSF was obtained prior to injecting local anesthetic into the CSF.  The spinal needle aspirated freely following injection.  The needle was carefully withdrawn.  The patient tolerated the procedure well. Consent was obtained prior to procedure with all questions answered and concerns addressed. Risks including but not limited to bleeding, infection, nerve damage, paralysis, failed block, inadequate analgesia, allergic reaction, high spinal, itching and headache were discussed and the patient wished to proceed.   Ismael Karge, MD   

## 2015-11-02 ENCOUNTER — Encounter (HOSPITAL_COMMUNITY): Payer: Self-pay | Admitting: Obstetrics and Gynecology

## 2015-11-02 LAB — CBC
HEMATOCRIT: 26.6 % — AB (ref 36.0–46.0)
HEMOGLOBIN: 8.4 g/dL — AB (ref 12.0–15.0)
MCH: 24.1 pg — AB (ref 26.0–34.0)
MCHC: 31.6 g/dL (ref 30.0–36.0)
MCV: 76.4 fL — AB (ref 78.0–100.0)
Platelets: 212 10*3/uL (ref 150–400)
RBC: 3.48 MIL/uL — AB (ref 3.87–5.11)
RDW: 16.4 % — ABNORMAL HIGH (ref 11.5–15.5)
WBC: 11.8 10*3/uL — AB (ref 4.0–10.5)

## 2015-11-02 LAB — RPR: RPR Ser Ql: NONREACTIVE

## 2015-11-02 MED ORDER — COMPLETENATE 29-1 MG PO CHEW
1.0000 | CHEWABLE_TABLET | Freq: Every day | ORAL | Status: DC
  Administered 2015-11-02 – 2015-11-03 (×2): 1 via ORAL
  Filled 2015-11-02 (×4): qty 1

## 2015-11-02 MED ORDER — IBUPROFEN 600 MG PO TABS: 600.0000 mg | ORAL_TABLET | Freq: Four times a day (QID) | ORAL | Status: DC | PRN

## 2015-11-02 MED ORDER — OXYCODONE-ACETAMINOPHEN 5-325 MG PO TABS
1.0000 | ORAL_TABLET | Freq: Four times a day (QID) | ORAL | Status: DC | PRN
  Filled 2015-11-02: qty 1

## 2015-11-02 MED ORDER — IBUPROFEN 100 MG/5ML PO SUSP
600.0000 mg | Freq: Four times a day (QID) | ORAL | Status: DC
  Administered 2015-11-02 – 2015-11-03 (×5): 600 mg via ORAL
  Filled 2015-11-02 (×9): qty 30

## 2015-11-02 NOTE — Addendum Note (Signed)
Addendum  created 11/02/15 0815 by Jhonnie Garner, CRNA   Modules edited: Clinical Notes   Clinical Notes:  File: [id number]

## 2015-11-02 NOTE — Lactation Note (Signed)
This note was copied from a baby's chart. Lactation Consultation Note: Mother states she bottle fed her first 2 children and  breastfed her 3 rd child for 3 months. Mother was breastfeeding when I arrived in her room. Infant released breast and mother denies having had any pain or discomfort with feedings. Mother states she learned a lot about breastfeeding when she had her last child. Mother informed of available LC services , gave [facility] brochure and informed of available hand pump if needed. Reviewed baby and me book with mother on basics of breastfeeding. Mother receptive to all teaching.   Patient Name: Gwendolyn Lynch [id number] Date: 11/02/2015 Reason for consult: Initial assessment   Maternal Data Has patient been taught Hand Expression?: Yes (mother verbalizes understanding of hand expression) Does the patient have breastfeeding experience prior to this delivery?: Yes  Feeding Feeding Type: Breast Fed  LATCH Score/Interventions                      Lactation Tools Discussed/Used     Consult Status Consult Status: Follow-up Date: 11/02/15 Follow-up type: In-patient    Stevan Born Gwendolyn Lynch 11/02/2015, 2:52 PM

## 2015-11-02 NOTE — Anesthesia Postprocedure Evaluation (Signed)
Anesthesia Post Note  Patient: Gwendolyn Lynch  Procedure(s) Performed: Procedure(s) (LRB): CESAREAN SECTION (N/A)  Patient location during evaluation: Mother Baby Anesthesia Type: Spinal Level of consciousness: awake and alert and oriented Pain management: pain level controlled Vital Signs Assessment: post-procedure vital signs reviewed and stable Respiratory status: spontaneous breathing Cardiovascular status: blood pressure returned to baseline Postop Assessment: no headache, patient able to bend at knees, adequate PO intake and no signs of nausea or vomiting Anesthetic complications: no    Last Vitals:  Filed Vitals:   11/02/15 0330 11/02/15 0730  BP: 105/56 95/44  Pulse: 65 66  Temp: 36.5 C 36.9 C  Resp: 18 18    Last Pain:  Filed Vitals:   11/02/15 0750  PainSc: 0-No pain                 Adrean Findlay

## 2015-11-02 NOTE — Progress Notes (Signed)
Gwendolyn Lynch [id number]  Subjective: Postpartum Day 1 -  S/p Repeat C/S due to h/o LTCSx2 and occasional FHR deceleration Patient in bed with  foley catheter, SCD's applied. Reports no syncope or dizziness.  C/o itching  Reports Pain currently well managed with Tylenol Feeding:  Breastfeeding  Contraceptive plan:  Undecided   Objective: Temp:  [97.5 F (36.4 C)-98.4 F (36.9 C)] 98.4 F (36.9 C) (02/10 0730) Pulse Rate:  [65-97] 66 (02/10 0730) Resp:  [14-20] 18 (02/10 0730) BP: (88-129)/(36-75) 95/44 mmHg (02/10 0730) SpO2:  [94 %-100 %] 98 % (02/10 0730) Weight:  [76.023 kg (167 lb 9.6 oz)] 76.023 kg (167 lb 9.6 oz) (02/09 1148)  CBC Latest Ref Rng 11/02/2015 11/01/2015 03/03/2015  WBC 4.0 - 10.5 K/uL 11.8(H) 10.4 8.6  Hemoglobin 12.0 - 15.0 g/dL 7.8(G) 9.5(A) 11.6(L)  Hematocrit 36.0 - 46.0 % 26.6(L) 29.2(L) 34.7(L)  Platelets 150 - 400 K/uL 212 245 232     Physical Exam:  General: alert and cooperative Lochia: inappropriate Uterine Fundus: firm Abdomen:  + bowel sounds, NT Incision: Honeycomb dressing CDI DVT Evaluation: No evidence of DVT seen on physical exam. Negative Homan's sign. JP drain:   none  Assessment/Plan: Status post cesarean delivery, day 1 Foley catheter to be removed by noon Encourage movement Offered Benadryl for itching  Stable Continue current care. Breastfeeding    Alphonzo Severance MSN, CNM 11/02/2015, 11:17 AM

## 2015-11-03 MED ORDER — IBUPROFEN 100 MG/5ML PO SUSP: 600.0000 mg | Freq: Four times a day (QID) | ORAL | Status: DC

## 2015-11-03 NOTE — Discharge Summary (Signed)
OB Discharge Summary     Patient Name: Gwendolyn Lynch DOB: 1986-04-06 MRN: [id number]  Date of admission: 11/01/2015 Delivering MD: Jaymes Graff   Date of discharge: 11/03/2015  Admitting diagnosis: 40WKS, MONITOR Intrauterine pregnancy: [redacted]w[redacted]d     Secondary diagnosis:  Principal Problem:   S/P C-section  Additional problems: none     Discharge diagnosis: Term Pregnancy Delivered                                                                                                Post partum procedures:none  Augmentation: N/a  Complications: None  Hospital course:  Sceduled C/S   30 y.o. yo [id number] at [redacted]w[redacted]d was admitted to the hospital 11/01/2015 for scheduled cesarean section with the following indication:Prior Uterine Surgery.  Membrane Rupture Time/Date: 6:01 PM ,11/01/2015   Patient delivered a Viable infant.11/01/2015  Details of operation can be found in separate operative note.  Pateint had an uncomplicated postpartum course.  She is ambulating, tolerating a regular diet, passing flatus, and urinating well. Patient is discharged home in stable condition on  11/03/2015          Physical exam  Filed Vitals:   11/02/15 0730 11/02/15 1130 11/02/15 1530 11/03/15 0532  BP: 95/44 113/45 116/54 103/52  Pulse: 66 63 66 79  Temp: 98.4 F (36.9 C) 97.4 F (36.3 C) 98 F (36.7 C) 99 F (37.2 C)  TempSrc: Oral Oral Oral Oral  Resp: Height:      Weight:      SpO2: 98% 99%  99%   General: alert and cooperative Lochia: appropriate Uterine Fundus: firm Incision: Healing well with no significant drainage DVT Evaluation: No evidence of DVT seen on physical exam. Negative Homan's sign. Labs: Lab Results  Component Value Date   WBC 11.8* 11/02/2015   HGB 8.4* 11/02/2015   HCT 26.6* 11/02/2015   MCV 76.4* 11/02/2015   PLT 212 11/02/2015   CMP 01/18/2008  Glucose 81  BUN 5(L)  Creatinine 0.73  Sodium 135  Potassium 3.7  Chloride 102  CO2 24  Calcium 9.6   Total Protein 7.4  Total Bilirubin 0.6  Alkaline Phos 55  AST 21  ALT 12    Discharge instruction: per After Visit Summary and "Baby and Me Booklet".  After visit meds:    Medication List    TAKE these medications        ibuprofen 100 MG/5ML suspension  Commonly known as:  ADVIL,MOTRIN  Take 30 mLs (600 mg total) by mouth every 6 (six) hours.     prenatal multivitamin Tabs tablet  Take 1 tablet by mouth daily at 12 noon.        Diet: routine diet  Activity: Advance as tolerated. Pelvic rest for 6 weeks.    Outpatient follow NG:EXBM in 3 days to schedule infant circumcision, make appointment in 6 weeks for postpartumj Follow up Appt:No future appointments. Follow up Visit:No Follow-up on file.  Postpartum contraception: Undecided  -  Will call office once Gwendolyn Lynch  Newborn Data: Live born female  Birth Weight:  7 lb 11.8 oz (3510 g) APGAR: 8, 9  Baby Feeding: Breast Disposition:home with mother   11/03/2015 Gwendolyn Lynch, CNM

## 2015-11-03 NOTE — Discharge Instructions (Signed)
Postpartum Care After Cesarean Delivery After you deliver your newborn (postpartum period), the usual stay in the hospital is 24-72 hours. If there were problems with your labor or delivery, or if you have other medical problems, you might be in the hospital longer.  While you are in the hospital, you will receive help and instructions on how to care for yourself and your newborn during the postpartum period.  While you are in the hospital:  It is normal for you to have pain or discomfort from the incision in your abdomen. Be sure to tell your nurses when you are having pain, where the pain is located, and what makes the pain worse.  If you are breastfeeding, you may feel uncomfortable contractions of your uterus for a couple of weeks. This is normal. The contractions help your uterus get back to normal size.  It is normal to have some bleeding after delivery.  For the first 1-3 days after delivery, the flow is red and the amount may be similar to a period.  It is common for the flow to start and stop.  In the first few days, you may pass some small clots. Let your nurses know if you begin to pass large clots or your flow increases.  Do not  flush blood clots down the toilet before having the nurse look at them.  During the next 3-10 days after delivery, your flow should become more watery and pink or brown-tinged in color.  Ten to fourteen days after delivery, your flow should be a small amount of yellowish-white discharge.  The amount of your flow will decrease over the first few weeks after delivery. Your flow may stop in 6-8 weeks. Most women have had their flow stop by 12 weeks after delivery.  You should change your sanitary pads frequently.  Wash your hands thoroughly with soap and water for at least 20 seconds after changing pads, using the toilet, or before holding or feeding your newborn.  Your intravenous (IV) tubing will be removed when you are drinking enough fluids.  The  urine drainage tube (urinary catheter) that was inserted before delivery may be removed within 6-8 hours after delivery or when feeling returns to your legs. You should feel like you need to empty your bladder within the first 6-8 hours after the catheter has been removed.  In case you become weak, lightheaded, or faint, call your nurse before you get out of bed for the first time and before you take a shower for the first time.  Within the first few days after delivery, your breasts may begin to feel tender and full. This is called engorgement. Breast tenderness usually goes away within 48-72 hours after engorgement occurs. You may also notice milk leaking from your breasts. If you are not breastfeeding, do not stimulate your breasts. Breast stimulation can make your breasts produce more milk.  Spending as much time as possible with your newborn is very important. During this time, you and your newborn can feel close and get to know each other. Having your newborn stay in your room (rooming in) will help to strengthen the bond with your newborn. It will give you time to get to know your newborn and become comfortable caring for your newborn.  Your hormones change after delivery. Sometimes the hormone changes can temporarily cause you to feel sad or tearful. These feelings should not last more than a few days. If these feelings last longer than that, you should talk to your  caregiver. °· If desired, talk to your caregiver about methods of family planning or contraception. °· Talk to your caregiver about immunizations. Your caregiver may want you to have the following immunizations before leaving the hospital: °· Tetanus, diphtheria, and pertussis (Tdap) or tetanus and diphtheria (Td) immunization. It is very important that you and your family (including grandparents) or others caring for your newborn are up-to-date with the Tdap or Td immunizations. The Tdap or Td immunization can help protect your newborn  from getting ill. °· Rubella immunization. °· Varicella (chickenpox) immunization. °· Influenza immunization. You should receive this annual immunization if you did not receive the immunization during your pregnancy. °  °This information is not intended to replace advice given to you by your health care provider. Make sure you discuss any questions you have with your health care provider. °  °Document Released: 06/02/2012 Document Reviewed: 06/02/2012 °Elsevier Interactive Patient Education ©2016 [employer]vier Inc. ° °Iron-Rich Diet °Iron is a mineral that helps your body to produce hemoglobin. Hemoglobin is a protein in your red blood cells that carries oxygen to your body's tissues. Eating too little iron may cause you to feel weak and tired, and it can increase your risk for infection. Eating enough iron is necessary for your body's metabolism, muscle function, and nervous system. °Iron is naturally found in many foods. It can also be added to foods or fortified in foods. There are two types of dietary iron: °· Heme iron. Heme iron is absorbed by the body more easily than nonheme iron. Heme iron is found in meat, poultry, and fish. °· Nonheme iron. Nonheme iron is found in dietary supplements, iron-fortified grains, beans, and vegetables. °You may need to follow an iron-rich diet if: °· You have been diagnosed with iron deficiency or iron-deficiency anemia. °· You have a condition that prevents you from absorbing dietary iron, such as: °¨ Infection in your intestines. °¨ Celiac disease. This involves long-lasting (chronic) inflammation of your intestines. °· You do not eat enough iron. °· You eat a diet that is high in foods that impair iron absorption. °· You have lost a lot of blood. °· You have heavy bleeding during your menstrual cycle. °· You are pregnant. °WHAT IS MY PLAN? °Your health care provider may help you to determine how much iron you need per day based on your condition. Generally, when a person  consumes sufficient amounts of iron in the diet, the following iron needs are met: °· Men. °¨ 14-18 years old: 11 mg per day. °¨ 19-50 years old: 8 mg per day. °· Women.   °¨ 14-18 years old: 15 mg per day. °¨ 19-50 years old: 18 mg per day. °¨ Over 50 years old: 8 mg per day. °¨ Pregnant women: 27 mg per day. °¨ Breastfeeding women: 9 mg per day. °WHAT DO I NEED TO KNOW ABOUT AN IRON-RICH DIET? °· Eat fresh fruits and vegetables that are high in vitamin C along with foods that are high in iron. This will help increase the amount of iron that your body absorbs from food, especially with foods containing nonheme iron. Foods that are high in vitamin C include oranges, peppers, tomatoes, and mango. °· Take iron supplements only as directed by your health care provider. Overdose of iron can be life-threatening. If you were prescribed iron supplements, take them with orange juice or a vitamin C supplement. °· Cook foods in pots and pans that are made from iron.   °· Eat nonheme iron-containing foods alongside foods that   are high in heme iron. This helps to improve your iron absorption.   °· Certain foods and drinks contain compounds that impair iron absorption. Avoid eating these foods in the same meal as iron-rich foods or with iron supplements. These include: °¨ Coffee, black tea, and red wine. °¨ Milk, dairy products, and foods that are high in calcium. °¨ Beans, soybeans, and peas. °¨ Whole grains. °· When eating foods that contain both nonheme iron and compounds that impair iron absorption, follow these tips to absorb iron better.   °¨ Soak beans overnight before cooking. °¨ Soak whole grains overnight and drain them before using. °¨ Ferment flours before baking, such as using yeast in bread dough. °WHAT FOODS CAN I EAT? °Grains  °Iron-fortified breakfast cereal. Iron-fortified whole-wheat bread. Enriched rice. Sprouted grains. °Vegetables  °Spinach. Potatoes with skin. Green peas. Broccoli. Red and green bell  peppers. Fermented vegetables. °Fruits  °Prunes. Raisins. Oranges. Strawberries. Mango. Grapefruit. °Meats and Other Protein Sources  °Beef liver. Oysters. Beef. Shrimp. Turkey. Chicken. Tuna. Sardines. Chickpeas. Nuts. Tofu. °Beverages  °Tomato juice. Fresh orange juice. Prune juice. Hibiscus tea. Fortified instant breakfast shakes. °Condiments  °Tahini. Fermented soy sauce.  °Sweets and Desserts  °Black-strap molasses.  °Other  °Wheat germ. °The items listed above may not be a complete list of recommended foods or beverages. Contact your dietitian for more options.  °WHAT FOODS ARE NOT RECOMMENDED? °Grains  °Whole grains. Bran cereal. Bran flour. Oats. °Vegetables  °Artichokes. Brussels sprouts. Kale. °Fruits  °Blueberries. Raspberries. Strawberries. Figs. °Meats and Other Protein Sources  °Soybeans. Products made from soy protein. °Dairy  °Milk. Cream. Cheese. Yogurt. Cottage cheese. °Beverages  °Coffee. Black tea. Red wine. °Sweets and Desserts  °Cocoa. Chocolate. Ice cream. °Other  °Basil. Oregano. Parsley. °The items listed above may not be a complete list of foods and beverages to avoid. Contact your dietitian for more information.  °  °This information is not intended to replace advice given to you by your health care provider. Make sure you discuss any questions you have with your health care provider. °  °Document Released: 04/22/2005 Document Revised: 09/29/2014 Document Reviewed: 04/05/2014 °Elsevier Interactive Patient Education ©2016 Elsevier Inc. ° °

## 2015-11-03 NOTE — Lactation Note (Addendum)
This note was copied from a baby's chart. Lactation Consultation Note  Reviewed supply, demand, and engorgement prevention and treatment with mother.  Harmony to be given to mother prior to discharge. Aware of support groups and outpatient services. Patient Name: Boy Jamiah Homeyer ZOXWR'U Date: 11/03/2015 Reason for consult: Follow-up assessment   Maternal Data    Feeding    [facility] Score/Interventions                      Lactation Tools Discussed/Used     Consult Status      Soyla Dryer 11/03/2015, 10:25 AM

## 2015-11-05 ENCOUNTER — Inpatient Hospital Stay (HOSPITAL_COMMUNITY): Admission: RE | Admit: 2015-11-05 | Payer: Medicaid Other | Source: Ambulatory Visit

## 2016-04-04 ENCOUNTER — Emergency Department (HOSPITAL_COMMUNITY)
Admission: EM | Admit: 2016-04-04 | Discharge: 2016-04-04 | Disposition: A | Discharge status: Home / Self Care | Payer: Medicaid Other | Attending: Emergency Medicine | Admitting: Emergency Medicine

## 2016-04-04 ENCOUNTER — Encounter (HOSPITAL_COMMUNITY): Payer: Self-pay | Admitting: Emergency Medicine

## 2016-04-04 DIAGNOSIS — J029 Acute pharyngitis, unspecified: Secondary | ICD-10-CM | POA: Diagnosis present

## 2016-04-04 DIAGNOSIS — Z79899 Other long term (current) drug therapy: Secondary | ICD-10-CM | POA: Diagnosis not present

## 2016-04-04 DIAGNOSIS — J020 Streptococcal pharyngitis: Secondary | ICD-10-CM | POA: Diagnosis not present

## 2016-04-04 LAB — RAPID STREP SCREEN (MED CTR MEBANE ONLY): STREPTOCOCCUS, GROUP A SCREEN (DIRECT): POSITIVE — AB

## 2016-04-04 MED ORDER — NAPROXEN 500 MG PO TABS: 500.0000 mg | ORAL_TABLET | Freq: Two times a day (BID) | ORAL | Status: DC | PRN

## 2016-04-04 MED ORDER — AMOXICILLIN 500 MG PO CAPS
500.0000 mg | ORAL_CAPSULE | Freq: Once | ORAL | Status: AC
  Administered 2016-04-04: 500 mg via ORAL
  Filled 2016-04-04: qty 1

## 2016-04-04 MED ORDER — DEXAMETHASONE SODIUM PHOSPHATE 10 MG/ML IJ SOLN
10.0000 mg | Freq: Once | INTRAMUSCULAR | Status: AC
  Administered 2016-04-04: 10 mg via INTRAMUSCULAR
  Filled 2016-04-04: qty 1

## 2016-04-04 MED ORDER — AMOXICILLIN 500 MG PO CAPS: 500.0000 mg | ORAL_CAPSULE | Freq: Two times a day (BID) | ORAL | Status: DC

## 2016-04-04 NOTE — Discharge Instructions (Signed)
Take amoxicillin as prescribed twice daily for 10 full days. It is very important that complete the entire course of this medication or the strep may not completely be treated.  Also discard your toothbrush and begin using a new one in 3 days. For sore throat, may take naproxen as needed. Follow up with your doctor in 2-3 days if no improvement. Return to the ED sooner for worsening condition, inability to swallow, breathing difficulty, new concerns.

## 2016-04-04 NOTE — ED Provider Notes (Signed)
CSN: [id number]     Arrival date & time 04/04/16  1056 History  By signing my name below, I, Phillis HaggisGabriella Gaje, attest that this documentation has been prepared under the direction and in the presence of [facility] Center[facility]ime Tanji Storrs, PA-C. Electronically Signed: Phillis HaggisGabriella Gaje, ED Scribe. 04/04/2016. 11:31 AM.   Chief Complaint  Patient presents with  . Sore Throat   The history is provided by the patient. No language interpreter was used.  HPI Comments: Gwendolyn Lynch is a 30 y.o. female who presents to the Emergency Department complaining of gradually worsening sore throat onset 3 days ago. Pt reports associated neck pain and fever. She reports worsening pain with eating. She has been using throat lozenges to mild relief but has not tried anything else. She denies sick contacts, chills, cough, congestion, SOB, chest pain, nausea, or vomiting.   Past Medical History  Diagnosis Date  . Hemorrhoids    Past Surgical History  Procedure Laterality Date  . Cesarean section      X2  . Cesarean section N/A 11/01/2015    Procedure: CESAREAN SECTION;  Surgeon: Jaymes GraffNaima Dillard, MD;  Location: [facility];  Service: Obstetrics;  Laterality: N/A;   Family History  Problem Relation Age of Onset  . Diabetes Mother   . Hypertension Mother   . Diabetes Father   . Hypertension Father    Social History  Substance Use Topics  . Smoking status: Never Smoker   . Smokeless tobacco: Never Used  . Alcohol Use: No   OB History    Gravida Para Term Preterm AB TAB SAB Ectopic Multiple Living   4 4 4       0 4     Review of Systems  Constitutional: Positive for fever. Negative for chills.  HENT: Positive for sore throat. Negative for congestion.   Respiratory: Negative for cough and shortness of breath.   Cardiovascular: Negative for chest pain.  Gastrointestinal: Negative for nausea and vomiting.  Musculoskeletal: Positive for neck pain.    Allergies  Review of patient's allergies indicates no known allergies.  Home  Medications   Prior to Admission medications   Medication Sig Start Date End Date Taking? Authorizing Provider  amoxicillin (AMOXIL) 500 MG capsule Take 1 capsule (500 mg total) by mouth 2 (two) times daily. 04/04/16   Tedd Cottrill Pilcher Micheale Schlack, PA-C  ibuprofen (ADVIL,MOTRIN) 100 MG/5ML suspension Take 30 mLs (600 mg total) by mouth every 6 (six) hours. 11/03/15   Alphonzo Severanceachel Stall, CNM  naproxen (NAPROSYN) 500 MG tablet Take 1 tablet (500 mg total) by mouth 2 (two) times daily as needed. 04/04/16   Chase PicketJaime Pilcher Kennie Snedden, PA-C  Prenatal Vit-Fe Fumarate-FA (PRENATAL MULTIVITAMIN) TABS tablet Take 1 tablet by mouth daily at 12 noon.    Historical Provider, MD   BP 119/89 mmHg  Pulse 57  Temp(Src) 98.3 F (36.8 C) (Oral)  Resp 16  SpO2 100%  LMP 03/22/2016 Physical Exam  Constitutional: She is oriented to person, place, and time. She appears well-developed and well-nourished.  HENT:  Head: Normocephalic and atraumatic.  Oropharynx with erythema and mild tonsillar hypertrophy, no exudates.  Neck: Normal range of motion. Neck supple.  No meningeal signs.  Cardiovascular: Normal rate, regular rhythm and normal heart sounds.   Pulmonary/Chest: Effort normal and breath sounds normal.  Lungs clear to auscultation bilaterally.  Abdominal: Soft. She exhibits no distension. There is no tenderness.  Musculoskeletal: Normal range of motion.  Lymphadenopathy:    She has cervical adenopathy.  Neurological: She is alert  and oriented to person, place, and time.  Skin: Skin is warm and dry. No rash noted.  Nursing note and vitals reviewed.   ED Course  Procedures (including critical care time) DIAGNOSTIC STUDIES: Oxygen Saturation is 99% on RA, normal by my interpretation.    COORDINATION OF CARE: 11:27 AM-Discussed treatment plan which includes strep screen with pt at bedside and pt agreed to plan.    Labs Review Labs Reviewed  RAPID STREP SCREEN (NOT AT [facility]) - Abnormal; Notable for the following:     Streptococcus, Group A Screen (Direct) POSITIVE (*)    All other components within normal limits    Imaging Review No results found. I have personally reviewed and evaluated these lab results as part of my medical decision-making.   EKG Interpretation None      MDM   Final diagnoses:  Strep pharyngitis   Gwendolyn Lynch presents to ED for sore throat 3 days. On exam, oropharynx with erythema and tonsillar hypertrophy as well as cervical adenopathy. Presentation non concerning for PTA or infxn spread to soft tissue. No trismus or uvular deviation. Will obtain rapid strep. Rapid strep positive. Patient treated with Decadron and Amoxil in ED. Rx for naproxen and Amoxil given. Specific return precautions discussed. Patient able to drink water in ED without difficulty with intact air way. Feels improved after decadron. Discussed importance of hydration. Recommended PCP follow up.    I personally performed the services described in this documentation, which was scribed in my presence. The recorded information has been reviewed and is accurate.     [facility] Robinette Esters, PA-C 04/04/16 1430  Derwood Kaplan, MD 04/06/16 1907

## 2016-04-04 NOTE — ED Notes (Signed)
Pt arrives via POv from home with 3 day hx of sore throat, fever, neck pain and pain with eating.

## 2016-08-22 ENCOUNTER — Inpatient Hospital Stay (HOSPITAL_COMMUNITY): Payer: Medicaid Other

## 2016-08-22 ENCOUNTER — Inpatient Hospital Stay (HOSPITAL_COMMUNITY)
Admission: AD | Admit: 2016-08-22 | Discharge: 2016-08-22 | Disposition: A | Discharge status: Home / Self Care | Payer: Medicaid Other | Source: Ambulatory Visit | Attending: Obstetrics & Gynecology | Admitting: Obstetrics & Gynecology

## 2016-08-22 ENCOUNTER — Encounter (HOSPITAL_COMMUNITY): Payer: Self-pay

## 2016-08-22 DIAGNOSIS — R1030 Lower abdominal pain, unspecified: Secondary | ICD-10-CM | POA: Diagnosis present

## 2016-08-22 DIAGNOSIS — B9689 Other specified bacterial agents as the cause of diseases classified elsewhere: Secondary | ICD-10-CM | POA: Diagnosis not present

## 2016-08-22 DIAGNOSIS — O26899 Other specified pregnancy related conditions, unspecified trimester: Secondary | ICD-10-CM | POA: Diagnosis not present

## 2016-08-22 DIAGNOSIS — O3680X0 Pregnancy with inconclusive fetal viability, not applicable or unspecified: Secondary | ICD-10-CM

## 2016-08-22 DIAGNOSIS — N760 Acute vaginitis: Secondary | ICD-10-CM | POA: Diagnosis not present

## 2016-08-22 DIAGNOSIS — Z3A00 Weeks of gestation of pregnancy not specified: Secondary | ICD-10-CM | POA: Diagnosis not present

## 2016-08-22 DIAGNOSIS — Z331 Pregnant state, incidental: Secondary | ICD-10-CM

## 2016-08-22 DIAGNOSIS — N898 Other specified noninflammatory disorders of vagina: Secondary | ICD-10-CM

## 2016-08-22 DIAGNOSIS — O23599 Infection of other part of genital tract in pregnancy, unspecified trimester: Secondary | ICD-10-CM | POA: Insufficient documentation

## 2016-08-22 DIAGNOSIS — Z833 Family history of diabetes mellitus: Secondary | ICD-10-CM | POA: Diagnosis not present

## 2016-08-22 DIAGNOSIS — Z8249 Family history of ischemic heart disease and other diseases of the circulatory system: Secondary | ICD-10-CM | POA: Insufficient documentation

## 2016-08-22 DIAGNOSIS — R109 Unspecified abdominal pain: Secondary | ICD-10-CM

## 2016-08-22 LAB — CBC
HCT: 30.8 % — ABNORMAL LOW (ref 36.0–46.0)
HEMOGLOBIN: 10.4 g/dL — AB (ref 12.0–15.0)
MCH: 25.6 pg — ABNORMAL LOW (ref 26.0–34.0)
MCHC: 33.8 g/dL (ref 30.0–36.0)
MCV: 75.7 fL — ABNORMAL LOW (ref 78.0–100.0)
Platelets: 245 10*3/uL (ref 150–400)
RBC: 4.07 MIL/uL (ref 3.87–5.11)
RDW: 15.7 % — ABNORMAL HIGH (ref 11.5–15.5)
WBC: 6.5 10*3/uL (ref 4.0–10.5)

## 2016-08-22 LAB — URINE MICROSCOPIC-ADD ON

## 2016-08-22 LAB — URINALYSIS, ROUTINE W REFLEX MICROSCOPIC
Bilirubin Urine: NEGATIVE
GLUCOSE, UA: NEGATIVE mg/dL
Ketones, ur: NEGATIVE mg/dL
LEUKOCYTES UA: NEGATIVE
Nitrite: NEGATIVE
PROTEIN: NEGATIVE mg/dL
SPECIFIC GRAVITY, URINE: 1.025 (ref 1.005–1.030)
pH: 6.5 (ref 5.0–8.0)

## 2016-08-22 LAB — WET PREP, GENITAL
Sperm: NONE SEEN
TRICH WET PREP: NONE SEEN
YEAST WET PREP: NONE SEEN

## 2016-08-22 LAB — POCT PREGNANCY, URINE: PREG TEST UR: POSITIVE — AB

## 2016-08-22 LAB — HCG, QUANTITATIVE, PREGNANCY: HCG, BETA CHAIN, QUANT, S: 1202 m[IU]/mL — AB (ref <5)

## 2016-08-22 MED ORDER — METRONIDAZOLE 500 MG PO TABS: 500.0000 mg | ORAL_TABLET | Freq: Two times a day (BID) | ORAL | 0 refills | Status: DC

## 2016-08-22 NOTE — Discharge Instructions (Signed)
Abdominal Pain During Pregnancy  Abdominal pain is common in pregnancy. Most of the time, it does not cause harm. There are many causes of abdominal pain. Some causes are more serious than others and sometimes the cause is not known. Abdominal pain can be a sign that something is very wrong with the pregnancy or the pain may have nothing to do with the pregnancy. Always tell your health care provider if you have any abdominal pain.  Follow these instructions at home:  · Do not have sex or put anything in your vagina until your symptoms go away completely.  · Watch your abdominal pain for any changes.  · Get plenty of rest until your pain improves.  · Drink enough fluid to keep your urine clear or pale yellow.  · Take over-the-counter or prescription medicines only as told by your health care provider.  · Keep all follow-up visits as told by your health care provider. This is important.  Contact a health care provider if:  · You have a fever.  · Your pain gets worse or you have cramping.  · Your pain continues after resting.  Get help right away if:  · You are bleeding, leaking fluid, or passing tissue from the vagina.  · You have vomiting or diarrhea that does not go away.  · You have painful or bloody urination.  · You notice a decrease in your baby's movements.  · You feel very weak or faint.  · You have shortness of breath.  · You develop a severe headache with abdominal pain.  · You have abnormal vaginal discharge with abdominal pain.  This information is not intended to replace advice given to you by your health care provider. Make sure you discuss any questions you have with your health care provider.  Document Released: 09/08/2005 Document Revised: 06/19/2016 Document Reviewed: 04/07/2013  Elsevier Interactive Patient Education © 2017 Elsevier Inc.

## 2016-08-22 NOTE — MAU Provider Note (Signed)
History     CSN: [id number]  Arrival date and time: 12/1610960451/17 1801   First Provider Initiated Contact with Patient 08/22/16 1921      Chief Complaint  Patient presents with  . Abdominal Cramping   HPI  Gwendolyn Lynch is a 30 y.o. G4P3 who presents with lower abdominal pain. Pain started 2 days ago. Describes as lower abdominal/pelvic cramping that is intermittent and occurs about every 30 minutes. Rates pain 5/10 when it occurs. Took 2 RS tylenol at noon today without relief. Endorses vaginal discharge that is thick & white. Discharge has no odor and does not cause irritation. Denies n/v/d, constipation, dysuria, fever/chills, recent intercourse. LMP 07/18/16. Pt does not think she is pregnancy but has not taken HPT & is not using contraception.  Last BM yesterday.   OB History    Gravida Para Term Preterm AB Living   5 4 4     4    SAB TAB Ectopic Multiple Live Births         0 4      Past Medical History:  Diagnosis Date  . Hemorrhoids     Past Surgical History:  Procedure Laterality Date  . CESAREAN SECTION     X2  . CESAREAN SECTION N/A 11/01/2015   Procedure: CESAREAN SECTION;  Surgeon: Jaymes GraffNaima Dillard, MD;  Location: [facility];  Service: Obstetrics;  Laterality: N/A;    Family History  Problem Relation Age of Onset  . Diabetes Mother   . Hypertension Mother   . Diabetes Father   . Hypertension Father     Social History  Substance Use Topics  . Smoking status: Never Smoker  . Smokeless tobacco: Never Used  . Alcohol use No    Allergies: No Known Allergies  Prescriptions Prior to Admission  Medication Sig Dispense Refill Last Dose  . acetaminophen (TYLENOL) 325 MG tablet Take 650 mg by mouth every 6 (six) hours as needed.   08/22/2016 at 1200  . amoxicillin (AMOXIL) 500 MG capsule Take 1 capsule (500 mg total) by mouth 2 (two) times daily. 20 capsule 0   . ibuprofen (ADVIL,MOTRIN) 100 MG/5ML suspension Take 30 mLs (600 mg total) by mouth every 6 (six) hours. 30  mL 2   . naproxen (NAPROSYN) 500 MG tablet Take 1 tablet (500 mg total) by mouth 2 (two) times daily as needed. 30 tablet 0   . Prenatal Vit-Fe Fumarate-FA (PRENATAL MULTIVITAMIN) TABS tablet Take 1 tablet by mouth daily at 12 noon.   10/31/2015 at Unknown time    Review of Systems  Constitutional: Negative for chills and fever.  Gastrointestinal: Positive for abdominal pain. Negative for constipation, diarrhea, nausea and vomiting.  Genitourinary: Negative for dysuria.       + vaginal discharge No vaginal bleeding No vaginal irritation   Physical Exam   Blood pressure 122/86, pulse 76, temperature 98.4 F (36.9 C), temperature source Oral, resp. rate 18, last menstrual period 07/18/2016, unknown if currently breastfeeding.  Physical Exam  Nursing note and vitals reviewed. Constitutional: She is oriented to person, place, and time. She appears well-developed and well-nourished. No distress.  HENT:  Head: Normocephalic and atraumatic.  Eyes: Conjunctivae are normal. Right eye exhibits no discharge. Left eye exhibits no discharge. No scleral icterus.  Neck: Normal range of motion.  Cardiovascular: Normal rate, regular rhythm and normal heart sounds.   No murmur heard. Respiratory: Effort normal and breath sounds normal. No respiratory distress. She has no wheezes.  GI: Soft. Bowel sounds  are normal. She exhibits no distension. There is no tenderness. There is no rebound and no guarding.  Genitourinary: Uterus normal. Cervix exhibits no motion tenderness and no friability. Right adnexum displays no mass and no tenderness. Left adnexum displays no mass and no tenderness. No bleeding in the vagina. Vaginal discharge (small amount of thin white discharge) found.  Genitourinary Comments: Cervix closed  Neurological: She is alert and oriented to person, place, and time.  Skin: Skin is warm and dry. She is not diaphoretic.  Psychiatric: She has a normal mood and affect. Her behavior is normal.  Judgment and thought content normal.    MAU Course  Procedures Results for orders placed or performed during the hospital encounter of 08/22/16 (from the past 24 hour(s))  Urinalysis, Routine w reflex microscopic (not [facility])     Status: Abnormal   Collection Time: 08/22/16  6:30 PM  Result Value Ref Range   Color, Urine YELLOW YELLOW   APPearance CLEAR CLEAR   Specific Gravity, Urine 1.025 1.005 - 1.030   pH 6.5 5.0 - 8.0   Glucose, UA NEGATIVE NEGATIVE mg/dL   Hgb urine dipstick SMALL (A) NEGATIVE   Bilirubin Urine NEGATIVE NEGATIVE   Ketones, ur NEGATIVE NEGATIVE mg/dL   Protein, ur NEGATIVE NEGATIVE mg/dL   Nitrite NEGATIVE NEGATIVE   Leukocytes, UA NEGATIVE NEGATIVE  Urine microscopic-add on     Status: Abnormal   Collection Time: 08/22/16  6:30 PM  Result Value Ref Range   Squamous Epithelial / LPF 0-5 (A) NONE SEEN   WBC, UA 0-5 0 - 5 WBC/hpf   RBC / HPF 0-5 0 - 5 RBC/hpf   Bacteria, UA RARE (A) NONE SEEN  Pregnancy, urine POC     Status: Abnormal   Collection Time: 08/22/16  6:52 PM  Result Value Ref Range   Preg Test, Ur POSITIVE (A) NEGATIVE  Wet prep, genital     Status: Abnormal   Collection Time: 08/22/16  7:34 PM  Result Value Ref Range   Yeast Wet Prep HPF POC NONE SEEN NONE SEEN   Trich, Wet Prep NONE SEEN NONE SEEN   Clue Cells Wet Prep HPF POC PRESENT (A) NONE SEEN   WBC, Wet Prep HPF POC FEW (A) NONE SEEN   Sperm NONE SEEN   CBC     Status: Abnormal   Collection Time: 08/22/16  7:51 PM  Result Value Ref Range   WBC 6.5 4.0 - 10.5 K/uL   RBC 4.07 3.87 - 5.11 MIL/uL   Hemoglobin 10.4 (L) 12.0 - 15.0 g/dL   HCT 16.130.8 (L) 09.636.0 - 04.546.0 %   MCV 75.7 (L) 78.0 - 100.0 fL   MCH 25.6 (L) 26.0 - 34.0 pg   MCHC 33.8 30.0 - 36.0 g/dL   RDW 40.915.7 (H) 81.111.5 - 91.415.5 %   Platelets 245 150 - 400 K/uL  hCG, quantitative, pregnancy     Status: Abnormal   Collection Time: 08/22/16  7:51 PM  Result Value Ref Range   hCG, Beta Chain, Quant, S 1,202 (H) <5 mIU/mL   [place]  Ob Comp Less 14 Wks  Result Date: 08/22/2016 CLINICAL DATA:  Pelvic pain and cramping for 2 days. Gestational age by LMP of 5 weeks 0 days. EXAM: OBSTETRIC <14 WK US AND TRANSVAGINAL OB US TECHNIQUE: Both transabdominal and transvaginal ultrasound examinations were performed for complete evaluation of the gestation as well as the maternal uterus, adnexal regions, and pelvic cul-de-sac. Transvaginal technique was performed to assess early  pregnancy. COMPARISON:  None. FINDINGS: Intrauterine gestational sac: Probable single early gestational sac Yolk sac:  Not Visualized. Embryo:  Not Visualized. MSD: 3  mm   5 w   0  d Subchorionic hemorrhage:  None visualized. Maternal uterus/adnexae: Normal appearance of both ovaries. No adnexal mass identified. Tiny amount of simple free fluid noted. IMPRESSION: Probable early intrauterine gestational sac, but no yolk sac, fetal pole, or cardiac activity yet visualized. Recommend follow-up quantitative B-HCG levels and follow-up US in 14 days to assess viability. This recommendation follows SRU consensus guidelines: Diagnostic Criteria for Nonviable Pregnancy Early in the First Trimester. Malva Limes Med 2013; 540:9811-91. Electronically Signed   By: Myles Rosenthal M.D.   On: 08/22/2016 20:57   US Ob Transvaginal  Result Date: 08/22/2016 CLINICAL DATA:  Pelvic pain and cramping for 2 days. Gestational age by LMP of 5 weeks 0 days. EXAM: OBSTETRIC <14 WK [place] AND TRANSVAGINAL OB US TECHNIQUE: Both transabdominal and transvaginal ultrasound examinations were performed for complete evaluation of the gestation as well as the maternal uterus, adnexal regions, and pelvic cul-de-sac. Transvaginal technique was performed to assess early pregnancy. COMPARISON:  None. FINDINGS: Intrauterine gestational sac: Probable single early gestational sac Yolk sac:  Not Visualized. Embryo:  Not Visualized. MSD: 3  mm   5 w   0  d Subchorionic hemorrhage:  None visualized. Maternal uterus/adnexae: Normal  appearance of both ovaries. No adnexal mass identified. Tiny amount of simple free fluid noted. IMPRESSION: Probable early intrauterine gestational sac, but no yolk sac, fetal pole, or cardiac activity yet visualized. Recommend follow-up quantitative B-HCG levels and follow-up US in 14 days to assess viability. This recommendation follows SRU consensus guidelines: Diagnostic Criteria for Nonviable Pregnancy Early in the First Trimester. Malva Limes Med 2013; 478:2956-21. Electronically Signed   By: Myles Rosenthal M.D.   On: 08/22/2016 20:57     MDM +UPT UA, wet prep, GC/chlamydia, CBC, ABO/Rh, quant hCG, HIV, and [place] today to rule out ectopic pregnancy O positive Ultrasound shows possible IUGS, no yolk sac, no adnexal mass -- BHCG 1202 This abdominal pain could represent a normal pregnancy, spontaneous abortion, or even an ectopic pregnancy which can be life-threatening. Cultures were obtained to rule out pelvic infection.    Assessment and Plan  A: 1. Pregnancy of unknown anatomic location   2. Abdominal cramping affecting pregnancy   3. BV (bacterial vaginosis)    P: Discharge home Go to [facility] Monday at 11 am for repeat BHCG Discussed reasons to return to [facility]U inc. S/s of ectopic Rx flagyl GC/CT pending  Judeth Horn 08/22/2016, 7:20 PM

## 2016-08-22 NOTE — MAU Note (Signed)
Patient presents with lower abdominal cramping x 2 days, took Tylenol did not help, LMP 07/18/16, vaginal discharge white no odor.

## 2016-08-23 LAB — HIV ANTIBODY (ROUTINE TESTING W REFLEX): HIV Screen 4th Generation wRfx: NONREACTIVE

## 2016-08-25 ENCOUNTER — Ambulatory Visit: Payer: Medicaid Other

## 2016-08-25 DIAGNOSIS — O3680X0 Pregnancy with inconclusive fetal viability, not applicable or unspecified: Secondary | ICD-10-CM

## 2016-08-25 LAB — GC/CHLAMYDIA PROBE AMP (~~LOC~~) NOT AT ARMC
Chlamydia: NEGATIVE
NEISSERIA GONORRHEA: NEGATIVE

## 2016-08-25 LAB — HCG, QUANTITATIVE, PREGNANCY: HCG, BETA CHAIN, QUANT, S: 3410 m[IU]/mL — AB (ref <5)

## 2016-08-25 NOTE — Progress Notes (Signed)
Patient presented to the office today for a repeat quant. Patient does complain of small amount of blood at this time no other symptoms to report. Quant level did rise however it is too early. [facility] needs to have u/s in one week. U/S schedule for 09/01/2016. Patient voice understanding at this time.

## 2016-08-27 ENCOUNTER — Ambulatory Visit: Payer: Medicaid Other

## 2016-09-01 ENCOUNTER — Ambulatory Visit (HOSPITAL_COMMUNITY)
Admission: RE | Admit: 2016-09-01 | Discharge: 2016-09-01 | Disposition: A | Discharge status: Home / Self Care | Payer: Medicaid Other | Source: Ambulatory Visit | Attending: Obstetrics & Gynecology | Admitting: Obstetrics & Gynecology

## 2016-09-01 DIAGNOSIS — Z3689 Encounter for other specified antenatal screening: Secondary | ICD-10-CM | POA: Insufficient documentation

## 2016-09-01 DIAGNOSIS — Z3A01 Less than 8 weeks gestation of pregnancy: Secondary | ICD-10-CM | POA: Diagnosis not present

## 2016-09-01 DIAGNOSIS — O3680X0 Pregnancy with inconclusive fetal viability, not applicable or unspecified: Secondary | ICD-10-CM

## 2016-09-11 ENCOUNTER — Telehealth: Payer: Self-pay | Admitting: *Deleted

## 2016-09-11 NOTE — Telephone Encounter (Signed)
Patient left message on nurse voicemail 09/10/16 at 9 am.  Patient states she wants to know if she can look for another provider for prenatal care or does she have to continue with our office.  Requests a return call at [phone].

## 2016-09-11 NOTE — Telephone Encounter (Signed)
Called pt and discussed her concern.  She stated that she was seen in MAU on 12/1, had US on 12/11 and did not receive a call regarding her ultrasound results as she was told she would. I apologized for any confusion or miscommunication and offered to discuss her US results. She agreed. I informed her of positive intrauterine pregnancy with EDD of 04/29/17. She may make arrangements to receive prenatal care wherever she would like. Pt stated that she had a C/Sectrion 9 months ago and wanted to know if this pregnancy put her at high risk because it is so soon. I stated that there is some additional risk since this pregnancy is within 1 year of her previous pregnancy and she had C-section. She may discuss her concerns further with her provider once she begin prenatal care.  Pt voiced understanding and expressed gratitude for my call and information provided.

## 2016-11-25 ENCOUNTER — Encounter: Payer: Self-pay | Admitting: Family

## 2016-11-25 ENCOUNTER — Encounter (HOSPITAL_COMMUNITY): Payer: Self-pay | Admitting: Obstetrics & Gynecology

## 2016-11-25 ENCOUNTER — Other Ambulatory Visit (HOSPITAL_COMMUNITY)
Admission: RE | Admit: 2016-11-25 | Discharge: 2016-11-25 | Disposition: A | Discharge status: Home / Self Care | Payer: Medicaid Other | Source: Ambulatory Visit | Attending: Family | Admitting: Family

## 2016-11-25 ENCOUNTER — Ambulatory Visit (INDEPENDENT_AMBULATORY_CARE_PROVIDER_SITE_OTHER): Payer: Medicaid Other | Admitting: Family

## 2016-11-25 VITALS — BP 100/68 | HR 81 | Wt 151.7 lb

## 2016-11-25 DIAGNOSIS — Z23 Encounter for immunization: Secondary | ICD-10-CM | POA: Diagnosis not present

## 2016-11-25 DIAGNOSIS — Z124 Encounter for screening for malignant neoplasm of cervix: Secondary | ICD-10-CM | POA: Diagnosis not present

## 2016-11-25 DIAGNOSIS — D649 Anemia, unspecified: Secondary | ICD-10-CM

## 2016-11-25 DIAGNOSIS — O99012 Anemia complicating pregnancy, second trimester: Secondary | ICD-10-CM

## 2016-11-25 DIAGNOSIS — O34219 Maternal care for unspecified type scar from previous cesarean delivery: Secondary | ICD-10-CM | POA: Diagnosis not present

## 2016-11-25 DIAGNOSIS — Z3490 Encounter for supervision of normal pregnancy, unspecified, unspecified trimester: Secondary | ICD-10-CM | POA: Insufficient documentation

## 2016-11-25 DIAGNOSIS — Z3492 Encounter for supervision of normal pregnancy, unspecified, second trimester: Secondary | ICD-10-CM

## 2016-11-25 DIAGNOSIS — O99019 Anemia complicating pregnancy, unspecified trimester: Secondary | ICD-10-CM

## 2016-11-25 DIAGNOSIS — D563 Thalassemia minor: Secondary | ICD-10-CM

## 2016-11-25 DIAGNOSIS — Z113 Encounter for screening for infections with a predominantly sexual mode of transmission: Secondary | ICD-10-CM | POA: Insufficient documentation

## 2016-11-25 DIAGNOSIS — Z01419 Encounter for gynecological examination (general) (routine) without abnormal findings: Secondary | ICD-10-CM | POA: Insufficient documentation

## 2016-11-25 DIAGNOSIS — Z3482 Encounter for supervision of other normal pregnancy, second trimester: Secondary | ICD-10-CM | POA: Diagnosis not present

## 2016-11-25 NOTE — Progress Notes (Signed)
Subjective:    Gwendolyn Lynch is a [id number] [redacted]w[redacted]d being seen today for her first obstetrical visit.  Her obstetrical history is significant for previous csection x 3 with last one in 2017, with evidence of a window at 40 weeks.  Pregnancy also complicated by short pregnancy interval.  OB/History indicates vertical incision however upon reading operative note it was a transverse incision.   Patient does intend to breast feed. Breastfed last two babies.  Pregnancy history fully reviewed.  Patient reports no complaints.  Vitals:   11/25/16 0757  BP: 100/68  Pulse: 81  Weight: 151 lb 11.2 oz (68.8 kg)    HISTORY: OB History  Gravida Para Term Preterm AB Living  5 4 4  0 0 4  SAB TAB Ectopic Multiple Live Births  0 0 0 0 4    # Outcome Date GA Lbr Len/2nd Weight Sex Delivery Anes PTL Lv  5 Current           4 Term 11/01/15 [redacted]w[redacted]d  7 lb 11.8 oz (3.51 kg) M CS-LVertical Spinal  LIV  3 Term 2012 [redacted]w[redacted]d  6 lb (2.722 kg) M CS-LTranv Spinal N LIV  2 Term 2009 [redacted]w[redacted]d  5 lb 11 oz (2.58 kg) M CS-LTranv EPI N LIV  1 Term 2005 [redacted]w[redacted]d  5 lb 8 oz (2.495 kg) M Vag-Spont EPI  LIV     Past Medical History:  Diagnosis Date  . Hemorrhoids   . Medical history non-contributory    Past Surgical History:  Procedure Laterality Date  . CESAREAN SECTION     X2  . CESAREAN SECTION N/A 11/01/2015   Procedure: CESAREAN SECTION;  Surgeon: Jaymes Graff, MD;  Location: [facility];  Service: Obstetrics;  Laterality: N/A;   Family History  Problem Relation Age of Onset  . Diabetes Mother   . Hypertension Mother   . Diabetes Father   . Hypertension Father      Exam   BP 100/68   Pulse 81   Wt 151 lb 11.2 oz (68.8 kg)   LMP 07/18/2016   BMI 31.71 kg/m  Uterine Size: size equals dates  Pelvic Exam:    Perineum: No Hemorrhoids, Normal Perineum   Vulva: normal   Vagina:  normal mucosa, normal discharge, no palpable nodules   pH: Not done   Cervix: no bleeding following Pap, no cervical motion tenderness  and no lesions   Adnexa: normal adnexa and no mass, fullness, tenderness   Bony Pelvis: Adequate  System: Breast:  No nipple retraction or dimpling, No nipple discharge or bleeding, No axillary or supraclavicular adenopathy, Normal to palpation without dominant masses   Skin: normal coloration and turgor, no rashes    Neurologic: negative   Extremities: normal strength, tone, and muscle mass   HEENT neck supple with midline trachea and thyroid without masses   Mouth/Teeth mucous membranes moist, pharynx normal without lesions   Neck supple and no masses   Cardiovascular: regular rate and rhythm, no murmurs or gallops   Respiratory:  appears well, vitals normal, no respiratory distress, acyanotic, normal RR, neck free of mass or lymphadenopathy, chest clear, no wheezing, crepitations, rhonchi, normal symmetric air entry   Abdomen: soft, non-tender; bowel sounds normal; no masses,  no organomegaly   Urinary: urethral meatus normal      Assessment:    Pregnancy: [id number] Patient Active Problem List   Diagnosis Date Noted  . Supervision of low-risk pregnancy 11/25/2016  . Previous cesarean section complicating pregnancy 11/25/2016  Plan:     Initial labs drawn.  Quad screen added. Prenatal vitamins. Problem list reviewed and updated. Genetic Screening discussed Quad Screen: ordered.  Ultrasound discussed; fetal survey: ordered.  Follow up in 4 weeks.  Gwendolyn Lynch 11/25/2016

## 2016-11-25 NOTE — Patient Instructions (Signed)

## 2016-11-26 LAB — CYTOLOGY - PAP
ADEQUACY: ABSENT
Chlamydia: NEGATIVE
DIAGNOSIS: NEGATIVE
Neisseria Gonorrhea: NEGATIVE

## 2016-11-28 ENCOUNTER — Encounter (HOSPITAL_COMMUNITY): Payer: Self-pay | Admitting: Family

## 2016-12-01 LAB — CULTURE, OB URINE

## 2016-12-01 LAB — URINE CULTURE, OB REFLEX

## 2016-12-03 ENCOUNTER — Encounter: Payer: Self-pay | Admitting: Family

## 2016-12-03 ENCOUNTER — Other Ambulatory Visit: Payer: Self-pay | Admitting: Family

## 2016-12-03 DIAGNOSIS — R8271 Bacteriuria: Secondary | ICD-10-CM | POA: Insufficient documentation

## 2016-12-03 MED ORDER — KEFLEX 500 MG PO CAPS: 500.0000 mg | ORAL_CAPSULE | Freq: Three times a day (TID) | ORAL | 0 refills | Status: DC

## 2016-12-04 ENCOUNTER — Telehealth: Payer: Self-pay

## 2016-12-04 NOTE — Telephone Encounter (Signed)
Called patient I have left a message for patient to call us back regarding the need to come in for labs.

## 2016-12-04 NOTE — Telephone Encounter (Signed)
-----   Message from Marlis EdelsonWalidah N Karim, [place] sent at 12/03/2016  4:32 PM EDT ----- Regarding: GBS UTI Please call regarding RX sent for GBS UTI and need to come in for prenatal labs, hgba1c, and quad.  Thanks! ----- Message ----- From: Interface, Lab In [facility] Sent: 11/26/2016   4:58 PM To: ZOXWRUEWalidah Kennith GainN Karim, CNM

## 2016-12-05 ENCOUNTER — Ambulatory Visit (HOSPITAL_COMMUNITY)
Admission: RE | Admit: 2016-12-05 | Discharge: 2016-12-05 | Disposition: A | Discharge status: Home / Self Care | Payer: Medicaid Other | Source: Ambulatory Visit | Attending: Family | Admitting: Family

## 2016-12-05 ENCOUNTER — Other Ambulatory Visit: Payer: Self-pay | Admitting: Family

## 2016-12-05 ENCOUNTER — Other Ambulatory Visit: Payer: Medicaid Other

## 2016-12-05 DIAGNOSIS — Z363 Encounter for antenatal screening for malformations: Secondary | ICD-10-CM

## 2016-12-05 DIAGNOSIS — Z3A20 20 weeks gestation of pregnancy: Secondary | ICD-10-CM

## 2016-12-05 DIAGNOSIS — O34219 Maternal care for unspecified type scar from previous cesarean delivery: Secondary | ICD-10-CM

## 2016-12-05 DIAGNOSIS — Z3492 Encounter for supervision of normal pregnancy, unspecified, second trimester: Secondary | ICD-10-CM

## 2016-12-05 DIAGNOSIS — O34212 Maternal care for vertical scar from previous cesarean delivery: Secondary | ICD-10-CM | POA: Diagnosis not present

## 2016-12-05 DIAGNOSIS — Z3A19 19 weeks gestation of pregnancy: Secondary | ICD-10-CM

## 2016-12-07 ENCOUNTER — Other Ambulatory Visit: Payer: Self-pay | Admitting: Family

## 2016-12-07 DIAGNOSIS — D563 Thalassemia minor: Secondary | ICD-10-CM | POA: Insufficient documentation

## 2016-12-07 DIAGNOSIS — O99019 Anemia complicating pregnancy, unspecified trimester: Secondary | ICD-10-CM | POA: Insufficient documentation

## 2016-12-07 MED ORDER — FERROUS SULFATE 325 (65 FE) MG PO TABS: 325.0000 mg | ORAL_TABLET | Freq: Two times a day (BID) | ORAL | 1 refills | Status: DC

## 2016-12-08 ENCOUNTER — Other Ambulatory Visit: Payer: Self-pay | Admitting: *Deleted

## 2016-12-08 ENCOUNTER — Telehealth: Payer: Self-pay | Admitting: *Deleted

## 2016-12-08 DIAGNOSIS — R8271 Bacteriuria: Secondary | ICD-10-CM

## 2016-12-08 LAB — PRENATAL PROFILE I(LABCORP)
ANTIBODY SCREEN: NEGATIVE
BASOS: 0 %
Basophils Absolute: 0 10*3/uL (ref 0.0–0.2)
EOS (ABSOLUTE): 0.3 10*3/uL (ref 0.0–0.4)
Eos: 4 %
HEMOGLOBIN: 9.6 g/dL — AB (ref 11.1–15.9)
Hematocrit: 30.9 % — ABNORMAL LOW (ref 34.0–46.6)
Hepatitis B Surface Ag: NEGATIVE
IMMATURE GRANS (ABS): 0 10*3/uL (ref 0.0–0.1)
Immature Granulocytes: 0 %
LYMPHS: 32 %
Lymphocytes Absolute: 2.6 10*3/uL (ref 0.7–3.1)
MCH: 24.1 pg — ABNORMAL LOW (ref 26.6–33.0)
MCHC: 31.1 g/dL — AB (ref 31.5–35.7)
MCV: 78 fL — ABNORMAL LOW (ref 79–97)
MONOS ABS: 0.4 10*3/uL (ref 0.1–0.9)
Monocytes: 5 %
NEUTROS PCT: 59 %
Neutrophils Absolute: 4.7 10*3/uL (ref 1.4–7.0)
PLATELETS: 245 10*3/uL (ref 150–379)
RBC: 3.98 x10E6/uL (ref 3.77–5.28)
RDW: 16.4 % — AB (ref 12.3–15.4)
RPR Ser Ql: NONREACTIVE
Rh Factor: POSITIVE
Rubella Antibodies, IGG: 3.24 index (ref >0.99)
WBC: 8 10*3/uL (ref 3.4–10.8)

## 2016-12-08 LAB — AFP, QUAD SCREEN
DIA MOM VALUE: 0.78
DIA VALUE (EIA): 134.7 pg/mL
DSR (By Age)    1 IN: 699
DSR (SECOND TRIMESTER) 1 IN: 10000
GESTATIONAL AGE AFP: 17.9 wk
MATERNAL AGE AT EDD: 30 a
MSAFP MOM: 2.69
MSAFP: 124.7 ng/mL
MSHCG Mom: 0.72
MSHCG: 20088 m[IU]/mL
OSB RISK: 404
T18 (By Age): 1:2723 {titer}
Test Results:: NEGATIVE
UE3 MOM: 1.53
WEIGHT: 151 [lb_av]
uE3 Value: 1.98 ng/mL

## 2016-12-08 LAB — ALPHA-THALASSEMIA

## 2016-12-08 LAB — HEMOGLOBINOPATHY EVALUATION
Ferritin: 20 ng/mL (ref 15–150)
HGB A: 98 % (ref 96.4–98.8)
HGB SOLUBILITY: NEGATIVE
HGB VARIANT: 0 %
Hgb A2 Quant: 2 % (ref 1.8–3.2)
Hgb C: 0 %
Hgb F Quant: 0 % (ref 0.0–2.0)
Hgb S: 0 %

## 2016-12-08 LAB — HEMOGLOBIN A1C
ESTIMATED AVERAGE GLUCOSE: 108 mg/dL
HEMOGLOBIN A1C: 5.4 % (ref 4.8–5.6)

## 2016-12-08 MED ORDER — CEPHALEXIN 500 MG PO CAPS: 500.0000 mg | ORAL_CAPSULE | Freq: Three times a day (TID) | ORAL | 0 refills | Status: DC

## 2016-12-08 NOTE — Telephone Encounter (Signed)
Per message from Rochele PagesWalidah Karim, CNM need to tell patient rx sent to pharmacy for iron for anemia.  I called home  number and unable to leave a message. Called mobile number and heard message was disconnected.

## 2016-12-10 ENCOUNTER — Encounter: Payer: Self-pay | Admitting: *Deleted

## 2016-12-10 NOTE — Telephone Encounter (Signed)
I called Gwendolyn Lynch home number and spoke to a female who said he was not at home but would give a message to [name]. I asked him to have her call our office. I called her mobile number and heard it was disconnected. Will send letter.

## 2016-12-10 NOTE — Telephone Encounter (Signed)
Gwendolyn Lynch called back from other call and I notified her she has a uti and rx sent to her pharmacy.  She has already had her labs drawn.  I also reviewed her next appt with her. She voices understanding.

## 2016-12-10 NOTE — Telephone Encounter (Signed)
Addendum:  Kyla Balzar[place]atiana called back and I nofied her that her lab showed she is anemic and rx for iron sent to her pharmacy.  We discussed can cause constipation  And if it does may take otc stool softener or call us to discuss. She voices understanding. I also reviewed her next appt with her.

## 2016-12-23 ENCOUNTER — Ambulatory Visit (INDEPENDENT_AMBULATORY_CARE_PROVIDER_SITE_OTHER): Payer: Medicaid Other | Admitting: Obstetrics and Gynecology

## 2016-12-23 VITALS — BP 110/62 | HR 77 | Wt 153.4 lb

## 2016-12-23 DIAGNOSIS — Z3492 Encounter for supervision of normal pregnancy, unspecified, second trimester: Secondary | ICD-10-CM

## 2016-12-23 DIAGNOSIS — O34219 Maternal care for unspecified type scar from previous cesarean delivery: Secondary | ICD-10-CM

## 2016-12-23 NOTE — Progress Notes (Signed)
   PRENATAL VISIT NOTE  Subjective:  Gwendolyn Lynch is a 31 y.o. G4P4004 at [redacted]w[redacted]d being seen today for ongoing prenatal care.  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  Patient reports no complaints.   . Vag. Bleeding: None.  Movement: Present. Denies leaking of fluid.   The following portions of the patient's history were reviewed and updated as appropriate: allergies, current medications, past family history, past medical history, past social history, past surgical history and problem list. Problem list updated.  Objective:   Vitals:   12/23/16 1025  BP: 110/62  Pulse: 77  Weight: 153 lb 6.4 oz (69.6 kg)    Fetal Status: Fetal Heart Rate (bpm): 154 Fundal Height: 21 cm Movement: Present     General:  Alert, oriented and cooperative. Patient is in no acute distress.  Skin: Skin is warm and dry. No rash noted.   Cardiovascular: Normal heart rate noted  Respiratory: Normal respiratory effort, no problems with respiration noted  Abdomen: Soft, gravid, appropriate for gestational age. Pain/Pressure: Present     Pelvic:  Cervical exam deferred        Extremities: Normal range of motion.  Edema: None  Mental Status: Normal mood and affect. Normal behavior. Normal judgment and thought content.   Assessment and Plan:  Pregnancy: [id number] at [redacted]w[redacted]d  1. Previous cesarean section complicating pregnancy   3 previous c-sections.   2. Encounter for supervision of low-risk pregnancy in second trimester  - [place] MFM OB FOLLOW UP; Future: to complete anatomy scan.   There are no diagnoses linked to this encounter. Preterm labor symptoms and general obstetric precautions including but not limited to vaginal bleeding, contractions, leaking of fluid and fetal  movement were reviewed in detail with the patient. Please refer to After Visit Summary for other counseling recommendations.  Return in about 4 weeks (around 01/20/2017).   Duane Lope, NP

## 2017-01-07 ENCOUNTER — Ambulatory Visit (HOSPITAL_COMMUNITY): Payer: Medicaid Other | Attending: Obstetrics and Gynecology

## 2017-01-20 ENCOUNTER — Ambulatory Visit (INDEPENDENT_AMBULATORY_CARE_PROVIDER_SITE_OTHER): Payer: Medicaid Other | Admitting: Advanced Practice Midwife

## 2017-01-20 ENCOUNTER — Encounter: Payer: Self-pay | Admitting: Advanced Practice Midwife

## 2017-01-20 VITALS — BP 117/64 | HR 94 | Wt 154.7 lb

## 2017-01-20 DIAGNOSIS — Z3A26 26 weeks gestation of pregnancy: Secondary | ICD-10-CM

## 2017-01-20 DIAGNOSIS — Z0489 Encounter for examination and observation for other specified reasons: Secondary | ICD-10-CM

## 2017-01-20 DIAGNOSIS — IMO0002 Reserved for concepts with insufficient information to code with codable children: Secondary | ICD-10-CM

## 2017-01-20 DIAGNOSIS — Z3482 Encounter for supervision of other normal pregnancy, second trimester: Secondary | ICD-10-CM

## 2017-01-20 DIAGNOSIS — O34219 Maternal care for unspecified type scar from previous cesarean delivery: Secondary | ICD-10-CM

## 2017-01-20 DIAGNOSIS — Z3492 Encounter for supervision of normal pregnancy, unspecified, second trimester: Secondary | ICD-10-CM

## 2017-01-20 NOTE — Progress Notes (Signed)
   PRENATAL VISIT NOTE  Subjective:  Gwendolyn Lynch is a 31 y.o. G5P4004 at [redacted]w[redacted]d being seen today for ongoing prenatal care.  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  Patient reports occasional contractions.  Contractions: Irritability. Vag. Bleeding: None.  Movement: Present. Denies leaking of fluid.   The following portions of the patient's history were reviewed and updated as appropriate: allergies, current medications, past family history, past medical history, past social history, past surgical history and problem list. Problem list updated.  Objective:   Vitals:   01/20/17 1044  BP: 117/64  Pulse: 94  Weight: 154 lb 11.2 oz (70.2 kg)    Fetal Status: Fetal Heart Rate (bpm): 150 Fundal Height: 27 cm Movement: Present     General:  Alert, oriented and cooperative. Patient is in no acute distress.  Skin: Skin is warm and dry. No rash noted.   Cardiovascular: Normal heart rate noted  Respiratory: Normal respiratory effort, no problems with respiration noted  Abdomen: Soft, gravid, appropriate for gestational age. Pain/Pressure: Present     Pelvic:  Cervical exam deferred        Extremities: Normal range of motion.  Edema: None  Mental Status: Normal mood and affect. Normal behavior. Normal judgment and thought content.   Assessment and Plan:  Pregnancy: G5P4004 at [redacted]w[redacted]d  1. [redacted] weeks gestation of pregnancy  - [place] MFM OB FOLLOW UP; Future  2. Evaluate anatomy not seen on prior sonogram  - [place] MFM OB FOLLOW UP; Future  3. Encounter for supervision of other normal pregnancy in second trimester  - [place] MFM OB FOLLOW UP; Future  4. Previous cesarean section complicating pregnancy - Discussed uterine window. Will have MD's discuss if that may  change timing of repeat C/S.  5. Encounter for supervision of low-risk pregnancy in second trimester   Preterm labor symptoms and general obstetric precautions including but not limited to vaginal bleeding, contractions, leaking of fluid and fetal movement were reviewed in detail with the patient. Please refer to After Visit Summary for other counseling recommendations.  Return in about 2 weeks (around 02/03/2017) for ROB/GTT.   Dorathy Kinsman, CNM

## 2017-01-20 NOTE — Patient Instructions (Signed)
Cesarean Delivery °Cesarean birth, or cesarean delivery, is the surgical delivery of a baby through an incision in the abdomen and the uterus. This may be referred to as a C-section. This procedure may be scheduled ahead of time, or it may be done in an emergency situation. °Tell a health care provider about: °· Any allergies you have. °· All medicines you are taking, including vitamins, herbs, eye drops, creams, and over-the-counter medicines. °· Any problems you or family members have had with anesthetic medicines. °· Any blood disorders you have. °· Any surgeries you have had. °· Any medical conditions you have. °· Whether you or any members of your family have a history of deep vein thrombosis (DVT) or pulmonary embolism (PE). °What are the risks? °Generally, this is a safe procedure. However, problems may occur, including: °· Infection. °· Bleeding. °· Allergic reactions to medicines. °· Damage to other structures or organs. °· Blood clots. °· Injury to your baby. ° °What happens before the procedure? °· Follow instructions from your health care provider about eating or drinking restrictions. °· Follow instructions from your health care provider about bathing before your procedure to help reduce your risk of infection. °· If you know that you are going to have a cesarean delivery, do not shave your pubic area. Shaving before the procedure may increase your risk of infection. °· Ask your health care provider about: °? Changing or stopping your regular medicines. This is especially important if you are taking diabetes medicines or blood thinners. °? Your pain management plan. This is especially important if you plan to breastfeed your baby. °? How long you will be in the hospital after the procedure. °? Any concerns you may have about receiving blood products if you need them during the procedure. °? Cord blood banking, if you plan to collect your baby’s umbilical cord blood. °· You may also want to ask your  health care provider: °? Whether you will be able to hold or breastfeed your baby while you are still in the operating room. °? Whether your baby can stay with you immediately after the procedure and during your recovery. °? Whether a family member or a person of your choice can go with you into the operating room and stay with you during the procedure, immediately after the procedure, and during your recovery. °· Plan to have someone drive you home when you are discharged from the hospital. °What happens during the procedure? °· Fetal monitors will be placed on your abdomen to monitor your heart rate and your baby's heart rate. °· Depending on the reason for your cesarean delivery, you may have a physical exam or additional testing, such as an ultrasound. °· An IV tube will be inserted into one of your veins. °· You may have your blood or urine tested. °· You will be given antibiotic medicine to help prevent infection. °· You may be given a special warming gown to wear to keep your temperature stable. °· Hair may be removed from your pubic area. °· The skin of your pubic area and lower abdomen will be cleaned with a germ-killing solution (antiseptic). °· A catheter may be inserted into your bladder through your urethra. This drains your urine during the procedure. °· You may be given one or more of the following: °? A medicine to numb the area (local anesthetic). °? A medicine to make you fall asleep (general anesthetic). °? A medicine (regional anesthetic) that is injected into your back or through a small   thin tube placed in your back (spinal anesthetic or epidural anesthetic). This numbs everything below the injection site and allows you to stay awake during your procedure. If this makes you feel nauseous, tell your health care provider. Medicines will be available to help reduce any nausea you may feel. °· An incision will be made in your abdomen, and then in your uterus. °· If you are awake during your  procedure, you may feel tugging and pulling in your abdomen, but you should not feel pain. If you feel pain, tell your health care provider immediately. °· Your baby will be removed from your uterus. You may feel more pressure or pushing while this happens. °· Immediately after birth, your baby will be dried and kept warm. You may be able to hold and breastfeed your baby. The umbilical cord may be clamped and cut during this time. °· Your placenta will be removed from your uterus. °· Your incisions will be closed with stitches (sutures). Staples, skin glue, or adhesive strips may also be applied to the incision in your abdomen. °· Bandages (dressings) will be placed over the incision in your abdomen. °The procedure may vary among health care providers and hospitals. °What happens after the procedure? °· Your blood pressure, heart rate, breathing rate, and blood oxygen level will be monitored often until the medicines you were given have worn off. °· You may continue to receive fluids and medicines through an IV tube. °· You will have some pain. Medicines will be available to help control your pain. °· To help prevent blood clots: °? You may be given medicines. °? You may have to wear compression stockings or devices. °? You will be encouraged to walk around when you are able. °· Hospital staff will encourage and support bonding with your baby. Your hospital may allow you and your baby to stay in the same room (rooming in) during your hospital stay to encourage successful breastfeeding. °· You may be encouraged to cough and breathe deeply often. This helps to prevent lung problems. °· If you have a catheter draining your urine, it will be removed as soon as possible after your procedure. °This information is not intended to replace advice given to you by your health care provider. Make sure you discuss any questions you have with your health care provider. °Document Released: 09/08/2005 Document Revised: 02/14/2016  Document Reviewed: 06/19/2015 °Elsevier Interactive Patient Education © 2017 [employer] ° °

## 2017-02-10 ENCOUNTER — Encounter: Payer: Self-pay | Admitting: Obstetrics and Gynecology

## 2017-02-10 ENCOUNTER — Ambulatory Visit (INDEPENDENT_AMBULATORY_CARE_PROVIDER_SITE_OTHER): Payer: Medicaid Other | Admitting: Obstetrics and Gynecology

## 2017-02-10 ENCOUNTER — Ambulatory Visit (HOSPITAL_COMMUNITY)
Admission: RE | Admit: 2017-02-10 | Discharge: 2017-02-10 | Disposition: A | Discharge status: Home / Self Care | Payer: Medicaid Other | Source: Ambulatory Visit | Attending: Advanced Practice Midwife | Admitting: Advanced Practice Midwife

## 2017-02-10 ENCOUNTER — Other Ambulatory Visit: Payer: Self-pay | Admitting: Advanced Practice Midwife

## 2017-02-10 ENCOUNTER — Encounter: Payer: Medicaid Other | Admitting: Medical

## 2017-02-10 VITALS — BP 104/57 | HR 77 | Wt 155.2 lb

## 2017-02-10 DIAGNOSIS — Z3482 Encounter for supervision of other normal pregnancy, second trimester: Secondary | ICD-10-CM

## 2017-02-10 DIAGNOSIS — Z0489 Encounter for examination and observation for other specified reasons: Secondary | ICD-10-CM

## 2017-02-10 DIAGNOSIS — Z23 Encounter for immunization: Secondary | ICD-10-CM | POA: Diagnosis not present

## 2017-02-10 DIAGNOSIS — Z3A29 29 weeks gestation of pregnancy: Secondary | ICD-10-CM | POA: Diagnosis not present

## 2017-02-10 DIAGNOSIS — O34219 Maternal care for unspecified type scar from previous cesarean delivery: Secondary | ICD-10-CM | POA: Diagnosis not present

## 2017-02-10 DIAGNOSIS — Z3493 Encounter for supervision of normal pregnancy, unspecified, third trimester: Secondary | ICD-10-CM

## 2017-02-10 DIAGNOSIS — Z048 Encounter for examination and observation for other specified reasons: Secondary | ICD-10-CM | POA: Insufficient documentation

## 2017-02-10 DIAGNOSIS — Z3A26 26 weeks gestation of pregnancy: Secondary | ICD-10-CM

## 2017-02-10 DIAGNOSIS — IMO0002 Reserved for concepts with insufficient information to code with codable children: Secondary | ICD-10-CM

## 2017-02-10 DIAGNOSIS — Z3483 Encounter for supervision of other normal pregnancy, third trimester: Secondary | ICD-10-CM

## 2017-02-10 NOTE — Progress Notes (Signed)
28 week labs/ tdap today C/o contractions x3 days

## 2017-02-10 NOTE — Patient Instructions (Signed)
DTaP Vaccine (Diphtheria, Tetanus, and Pertussis): What You Need to Know 1. Why get vaccinated? Diphtheria, tetanus, and pertussis are serious diseases caused by bacteria. Diphtheria and pertussis are spread from person to person. Tetanus enters the body through cuts or wounds. DIPHTHERIA causes a thick covering in the back of the throat.  It can lead to breathing problems, paralysis, heart failure, and even death.  TETANUS (Lockjaw) causes painful tightening of the muscles, usually all over the body.  It can lead to "locking" of the jaw so the victim cannot open his mouth or swallow. Tetanus leads to death in up to 2 out of 10 cases.  PERTUSSIS (Whooping Cough) causes coughing spells so bad that it is hard for infants to eat, drink, or breathe. These spells can last for weeks.  It can lead to pneumonia, seizures (jerking and staring spells), brain damage, and death.  Diphtheria, tetanus, and pertussis vaccine (DTaP) can help prevent these diseases. Most children who are vaccinated with DTaP will be protected throughout childhood. Many more children would get these diseases if we stopped vaccinating. DTaP is a safer version of an older vaccine called DTP. DTP is no longer used in the [place]. 2. Who should get DTaP vaccine and when? Children should get 5 doses of DTaP vaccine, one dose at each of the following ages:  2 months  4 months  6 months  15-18 months  4-6 years  DTaP may be given at the same time as other vaccines. 3. Some children should not get DTaP vaccine or should wait  Children with minor illnesses, such as a cold, may be vaccinated. But children who are moderately or severely ill should usually wait until they recover before getting DTaP vaccine.  Any child who had a life-threatening allergic reaction after a dose of DTaP should not get another dose.  Any child who suffered a brain or nervous system disease within 7 days after a dose of DTaP should not get  another dose.  Talk with your doctor if your child: ? had a seizure or collapsed after a dose of DTaP, ? cried non-stop for 3 hours or more after a dose of DTaP, ? had a fever over 105F after a dose of DTaP. Ask your doctor for more information. Some of these children should not get another dose of pertussis vaccine, but may get a vaccine without pertussis, called DT. 4. Older children and adults DTaP is not licensed for adolescents, adults, or children 7 years of age and older. But older people still need protection. A vaccine called Tdap is similar to DTaP. A single dose of Tdap is recommended for people 11 through 30 years of age. Another vaccine, called Td, protects against tetanus and diphtheria, but not pertussis. It is recommended every 10 years. There are separate Vaccine Information Statements for these vaccines. 5. What are the risks from DTaP vaccine? Getting diphtheria, tetanus, or pertussis disease is much riskier than getting DTaP vaccine. However, a vaccine, like any medicine, is capable of causing serious problems, such as severe allergic reactions. The risk of DTaP vaccine causing serious harm, or death, is extremely small. Mild problems (common)  Fever (up to about 1 child in 4)  Redness or swelling where the shot was given (up to about 1 child in 4)  Soreness or tenderness where the shot was given (up to about 1 child in 4) These problems occur more often after the 4th and 5th doses of the DTaP series than after   earlier doses. Sometimes the 4th or 5th dose of DTaP vaccine is followed by swelling of the entire arm or leg in which the shot was given, lasting 1-7 days (up to about 1 child in 30). Other mild problems include:  Fussiness (up to about 1 child in 3)  Tiredness or poor appetite (up to about 1 child in 10)  Vomiting (up to about 1 child in 50) These problems generally occur 1-3 days after the shot. Moderate problems (uncommon)  Seizure (jerking or staring)  (about 1 child out of 14,000)  Non-stop crying, for 3 hours or more (up to about 1 child out of 1,000)  High fever, over 105F (about 1 child out of 16,000) Severe problems (very rare)  Serious allergic reaction (less than 1 out of a million doses)  Several other severe problems have been reported after DTaP vaccine. These include: ? Long-term seizures, coma, or lowered consciousness ? Permanent brain damage. These are so rare it is hard to tell if they are caused by the vaccine. Controlling fever is especially important for children who have had seizures, for any reason. It is also important if another family member has had seizures. You can reduce fever and pain by giving your child an aspirin-free pain reliever when the shot is given, and for the next 24 hours, following the package instructions. 6. What if there is a serious reaction? What should I look for? Look for anything that concerns you, such as signs of a severe allergic reaction, very high fever, or behavior changes. Signs of a severe allergic reaction can include hives, swelling of the face and throat, difficulty breathing, a fast heartbeat, dizziness, and weakness. These would start a few minutes to a few hours after the vaccination. What should I do?  If you think it is a severe allergic reaction or other emergency that can't wait, call 9-1-1 or get the person to the nearest hospital. Otherwise, call your doctor.  Afterward, the reaction should be reported to the Vaccine Adverse Event Reporting System (VAERS). Your doctor might file this report, or you can do it yourself through the VAERS web site at www.vaers.hhs.gov, or by calling 1-800-822-7967. ? VAERS is only for reporting reactions. They do not give medical advice. 7. The National Vaccine Injury Compensation Program The National Vaccine Injury Compensation Program (VICP) is a federal program that was created to compensate people who may have been injured by certain  vaccines. Persons who believe they may have been injured by a vaccine can learn about the program and about filing a claim by calling 1-800-338-2382 or visiting the VICP website at www.hrsa.gov/vaccinecompensation. 8. How can I learn more?  Ask your doctor.  Call your local or state health department.  Contact the Centers for Disease Control and Prevention (CDC): ? Call 1-800-232-4636 (1-800-CDC-INFO) or ? Visit CDC's website at www.cdc.gov/vaccines CDC DTaP Vaccine (Diphtheria, Tetanus, and Pertussis) VIS (02/05/06) This information is not intended to replace advice given to you by your health care provider. Make sure you discuss any questions you have with your health care provider. Document Released: 07/06/2006 Document Revised: 05/29/2016 Document Reviewed: 05/29/2016 Elsevier Interactive Patient Education  2017 [employer] Inc[employer]  

## 2017-02-10 NOTE — Progress Notes (Signed)
   PRENATAL VISIT NOTE  Subjective:  Gwendolyn Lynch is a 31 y.o. G5P4004 at 922w6d being seen today for ongoing prenatal care.  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  Patient reports occasional contractions.  Contractions: Irregular. Vag. Bleeding: None.  Movement: Present. Denies leaking of fluid.   The following portions of the patient's history were reviewed and updated as appropriate: allergies, current medications, past family history, past medical history, past social history, past surgical history and problem list. Problem list updated.  Objective:   Vitals:   02/10/17 0944  BP: (!) 104/57  Pulse: 77  Weight: 155 lb 3.2 oz (70.4 kg)    Fetal Status: Fetal Heart Rate (bpm): 141   Movement: Present     General:  Alert, oriented and cooperative. Patient is in no acute distress.  Skin: Skin is warm and dry. No rash noted.   Cardiovascular: Normal heart rate noted  Respiratory: Normal respiratory effort, no problems with respiration noted  Abdomen: Soft, gravid, appropriate for gestational age. Pain/Pressure: Present     Pelvic:  Cervical exam deferred        Extremities: Normal range of motion.  Edema: Trace  Mental Status: Normal mood and affect. Normal behavior. Normal judgment and thought content.   Assessment and Plan:  Pregnancy: G5P4004 at 812w6d  1. Encounter for supervision of low-risk pregnancy in third trimester 28 wk labs today -occasional contractions <5 per 24 hour period advised hydration - Glucose Tolerance, 2 Hours w/1 Hour - CBC - HIV antibody (with reflex) - RPR  2. Need for Tdap vaccination - Tdap vaccine greater than or equal to 7yo IM  Preterm labor symptoms and general obstetric precautions including but not limited  to vaginal bleeding, contractions, leaking of fluid and fetal movement were reviewed in detail with the patient. Please refer to After Visit Summary for other counseling recommendations.  Return in about 4 weeks (around 03/10/2017) for HROB.   Ernestina PennaNicholas Bryton Romagnoli, MD

## 2017-02-11 LAB — CBC
Hematocrit: 29.6 % — ABNORMAL LOW (ref 34.0–46.6)
Hemoglobin: 9.5 g/dL — ABNORMAL LOW (ref 11.1–15.9)
MCH: 24.9 pg — AB (ref 26.6–33.0)
MCHC: 32.1 g/dL (ref 31.5–35.7)
MCV: 78 fL — ABNORMAL LOW (ref 79–97)
PLATELETS: 233 10*3/uL (ref 150–379)
RBC: 3.82 x10E6/uL (ref 3.77–5.28)
RDW: 15.2 % (ref 12.3–15.4)
WBC: 9.3 10*3/uL (ref 3.4–10.8)

## 2017-02-11 LAB — GLUCOSE TOLERANCE, 2 HOURS W/ 1HR
GLUCOSE, 1 HOUR: 132 mg/dL (ref 65–179)
GLUCOSE, FASTING: 76 mg/dL (ref 65–91)
Glucose, 2 hour: 71 mg/dL (ref 65–152)

## 2017-02-11 LAB — HIV ANTIBODY (ROUTINE TESTING W REFLEX): HIV SCREEN 4TH GENERATION: NONREACTIVE

## 2017-02-11 LAB — RPR: RPR Ser Ql: NONREACTIVE

## 2017-03-09 ENCOUNTER — Encounter (HOSPITAL_COMMUNITY): Payer: Self-pay

## 2017-03-09 ENCOUNTER — Ambulatory Visit (INDEPENDENT_AMBULATORY_CARE_PROVIDER_SITE_OTHER): Payer: Medicaid Other | Admitting: Obstetrics & Gynecology

## 2017-03-09 VITALS — BP 108/57 | HR 81 | Wt 159.6 lb

## 2017-03-09 DIAGNOSIS — O34219 Maternal care for unspecified type scar from previous cesarean delivery: Secondary | ICD-10-CM

## 2017-03-09 DIAGNOSIS — Z3493 Encounter for supervision of normal pregnancy, unspecified, third trimester: Secondary | ICD-10-CM

## 2017-03-09 LAB — POCT URINALYSIS DIP (DEVICE)
Bilirubin Urine: NEGATIVE
Glucose, UA: NEGATIVE mg/dL
KETONES UR: NEGATIVE mg/dL
Leukocytes, UA: NEGATIVE
Nitrite: NEGATIVE
PH: 5.5 (ref 5.0–8.0)
PROTEIN: NEGATIVE mg/dL
SPECIFIC GRAVITY, URINE: 1.025 (ref 1.005–1.030)
Urobilinogen, UA: 0.2 mg/dL (ref 0.0–1.0)

## 2017-03-09 NOTE — Patient Instructions (Signed)
Third Trimester of Pregnancy The third trimester is from week 28 through week 40 (months 7 through 9). The third trimester is a time when the unborn baby (fetus) is growing rapidly. At the end of the ninth month, the fetus is about 20 inches in length and weighs 6-10 pounds. Body changes during your third trimester Your body will continue to go through many changes during pregnancy. The changes vary from woman to woman. During the third trimester:  Your weight will continue to increase. You can expect to gain 25-35 pounds (11-16 kg) by the end of the pregnancy.  You may begin to get stretch marks on your hips, abdomen, and breasts.  You may urinate more often because the fetus is moving lower into your pelvis and pressing on your bladder.  You may develop or continue to have heartburn. This is caused by increased hormones that slow down muscles in the digestive tract.  You may develop or continue to have constipation because increased hormones slow digestion and cause the muscles that push waste through your intestines to relax.  You may develop hemorrhoids. These are swollen veins (varicose veins) in the rectum that can itch or be painful.  You may develop swollen, bulging veins (varicose veins) in your legs.  You may have increased body aches in the pelvis, back, or thighs. This is due to weight gain and increased hormones that are relaxing your joints.  You may have changes in your hair. These can include thickening of your hair, rapid growth, and changes in texture. Some women also have hair loss during or after pregnancy, or hair that feels dry or thin. Your hair will most likely return to normal after your baby is born.  Your breasts will continue to grow and they will continue to become tender. A yellow fluid (colostrum) may leak from your breasts. This is the first milk you are producing for your baby.  Your belly button may stick out.  You may notice more swelling in your hands,  face, or ankles.  You may have increased tingling or numbness in your hands, arms, and legs. The skin on your belly may also feel numb.  You may feel short of breath because of your expanding uterus.  You may have more problems sleeping. This can be caused by the size of your belly, increased need to urinate, and an increase in your body's metabolism.  You may notice the fetus "dropping," or moving lower in your abdomen (lightening).  You may have increased vaginal discharge.  You may notice your joints feel loose and you may have pain around your pelvic bone.  What to expect at prenatal visits You will have prenatal exams every 2 weeks until week 36. Then you will have weekly prenatal exams. During a routine prenatal visit:  You will be weighed to make sure you and the baby are growing normally.  Your blood pressure will be taken.  Your abdomen will be measured to track your baby's growth.  The fetal heartbeat will be listened to.  Any test results from the previous visit will be discussed.  You may have a cervical check near your due date to see if your cervix has softened or thinned (effaced).  You will be tested for Group B streptococcus. This happens between 35 and 37 weeks.  Your health care provider may ask you:  What your birth plan is.  How you are feeling.  If you are feeling the baby move.  If you have had   any abnormal symptoms, such as leaking fluid, bleeding, severe headaches, or abdominal cramping.  If you are using any tobacco products, including cigarettes, chewing tobacco, and electronic cigarettes.  If you have any questions.  Other tests or screenings that may be performed during your third trimester include:  Blood tests that check for low iron levels (anemia).  Fetal testing to check the health, activity level, and growth of the fetus. Testing is done if you have certain medical conditions or if there are problems during the  pregnancy.  Nonstress test (NST). This test checks the health of your baby to make sure there are no signs of problems, such as the baby not getting enough oxygen. During this test, a belt is placed around your belly. The baby is made to move, and its heart rate is monitored during movement.  What is false labor? False labor is a condition in which you feel small, irregular tightenings of the muscles in the womb (contractions) that usually go away with rest, changing position, or drinking water. These are called Braxton Hicks contractions. Contractions may last for hours, days, or even weeks before true labor sets in. If contractions come at regular intervals, become more frequent, increase in intensity, or become painful, you should see your health care provider. What are the signs of labor?  Abdominal cramps.  Regular contractions that start at 10 minutes apart and become stronger and more frequent with time.  Contractions that start on the top of the uterus and spread down to the lower abdomen and back.  Increased pelvic pressure and dull back pain.  A watery or bloody mucus discharge that comes from the vagina.  Leaking of amniotic fluid. This is also known as your "water breaking." It could be a slow trickle or a gush. Let your health care provider know if it has a color or strange odor. If you have any of these signs, call your health care provider right away, even if it is before your due date. Follow these instructions at home: Medicines  Follow your health care provider's instructions regarding medicine use. Specific medicines may be either safe or unsafe to take during pregnancy.  Take a prenatal vitamin that contains at least 600 micrograms (mcg) of folic acid.  If you develop constipation, try taking a stool softener if your health care provider approves. Eating and drinking  Eat a balanced diet that includes fresh fruits and vegetables, whole grains, good sources of protein  such as meat, eggs, or tofu, and low-fat dairy. Your health care provider will help you determine the amount of weight gain that is right for you.  Avoid raw meat and uncooked cheese. These carry germs that can cause birth defects in the baby.  If you have low calcium intake from food, talk to your health care provider about whether you should take a daily calcium supplement.  Eat four or five small meals rather than three large meals a day.  Limit foods that are high in fat and processed sugars, such as fried and sweet foods.  To prevent constipation: ? Drink enough fluid to keep your urine clear or pale yellow. ? Eat foods that are high in fiber, such as fresh fruits and vegetables, whole grains, and beans. Activity  Exercise only as directed by your health care provider. Most women can continue their usual exercise routine during pregnancy. Try to exercise for 30 minutes at least 5 days a week. Stop exercising if you experience uterine contractions.  Avoid heavy   lifting.  Do not exercise in extreme heat or humidity, or at high altitudes.  Wear low-heel, comfortable shoes.  Practice good posture.  You may continue to have sex unless your health care provider tells you otherwise. Relieving pain and discomfort  Take frequent breaks and rest with your legs elevated if you have leg cramps or low back pain.  Take warm sitz baths to soothe any pain or discomfort caused by hemorrhoids. Use hemorrhoid cream if your health care provider approves.  Wear a good support bra to prevent discomfort from breast tenderness.  If you develop varicose veins: ? Wear support pantyhose or compression stockings as told by your healthcare provider. ? Elevate your feet for 15 minutes, 3-4 times a day. Prenatal care  Write down your questions. Take them to your prenatal visits.  Keep all your prenatal visits as told by your health care provider. This is important. Safety  Wear your seat belt at  all times when driving.  Make a list of emergency phone numbers, including numbers for family, friends, the hospital, and police and fire departments. General instructions  Avoid cat litter boxes and soil used by cats. These carry germs that can cause birth defects in the baby. If you have a cat, ask someone to clean the litter box for you.  Do not travel far distances unless it is absolutely necessary and only with the approval of your health care provider.  Do not use hot tubs, steam rooms, or saunas.  Do not drink alcohol.  Do not use any products that contain nicotine or tobacco, such as cigarettes and e-cigarettes. If you need help quitting, ask your health care provider.  Do not use any medicinal herbs or unprescribed drugs. These chemicals affect the formation and growth of the baby.  Do not douche or use tampons or scented sanitary pads.  Do not cross your legs for long periods of time.  To prepare for the arrival of your baby: ? Take prenatal classes to understand, practice, and ask questions about labor and delivery. ? Make a trial run to the hospital. ? Visit the hospital and tour the maternity area. ? Arrange for maternity or paternity leave through employers. ? Arrange for family and friends to take care of pets while you are in the hospital. ? Purchase a rear-facing car seat and make sure you know how to install it in your car. ? Pack your hospital bag. ? Prepare the baby's nursery. Make sure to remove all pillows and stuffed animals from the baby's crib to prevent suffocation.  Visit your dentist if you have not gone during your pregnancy. Use a soft toothbrush to brush your teeth and be gentle when you floss. Contact a health care provider if:  You are unsure if you are in labor or if your water has broken.  You become dizzy.  You have mild pelvic cramps, pelvic pressure, or nagging pain in your abdominal area.  You have lower back pain.  You have persistent  nausea, vomiting, or diarrhea.  You have an unusual or bad smelling vaginal discharge.  You have pain when you urinate. Get help right away if:  Your water breaks before 37 weeks.  You have regular contractions less than 5 minutes apart before 37 weeks.  You have a fever.  You are leaking fluid from your vagina.  You have spotting or bleeding from your vagina.  You have severe abdominal pain or cramping.  You have rapid weight loss or weight gain.    You have shortness of breath with chest pain.  You notice sudden or extreme swelling of your face, hands, ankles, feet, or legs.  Your baby makes fewer than 10 movements in 2 hours.  You have severe headaches that do not go away when you take medicine.  You have vision changes. Summary  The third trimester is from week 28 through week 40, months 7 through 9. The third trimester is a time when the unborn baby (fetus) is growing rapidly.  During the third trimester, your discomfort may increase as you and your baby continue to gain weight. You may have abdominal, leg, and back pain, sleeping problems, and an increased need to urinate.  During the third trimester your breasts will keep growing and they will continue to become tender. A yellow fluid (colostrum) may leak from your breasts. This is the first milk you are producing for your baby.  False labor is a condition in which you feel small, irregular tightenings of the muscles in the womb (contractions) that eventually go away. These are called Braxton Hicks contractions. Contractions may last for hours, days, or even weeks before true labor sets in.  Signs of labor can include: abdominal cramps; regular contractions that start at 10 minutes apart and become stronger and more frequent with time; watery or bloody mucus discharge that comes from the vagina; increased pelvic pressure and dull back pain; and leaking of amniotic fluid. This information is not intended to replace advice  given to you by your health care provider. Make sure you discuss any questions you have with your health care provider. Document Released: 09/02/2001 Document Revised: 02/14/2016 Document Reviewed: 11/09/2012 Elsevier Interactive Patient Education  2017 [employer]  

## 2017-03-09 NOTE — Progress Notes (Signed)
   PRENATAL VISIT NOTE  Subjective:  Gwendolyn Lynch is a 31 y.o. G5P4004 at 6131w5d being seen today for ongoing prenatal care.  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  Patient reports no complaints.  Contractions: Irregular. Vag. Bleeding: None.  Movement: Present. Denies leaking of fluid.   The following portions of the patient's history were reviewed and updated as appropriate: allergies, current medications, past family history, past medical history, past social history, past surgical history and problem list. Problem list updated.  Objective:   Vitals:   03/09/17 1038  BP: (!) 108/57  Pulse: 81  Weight: 159 lb 9.6 oz (72.4 kg)    Fetal Status: Fetal Heart Rate (bpm): 140 Fundal Height: 33 cm Movement: Present     General:  Alert, oriented and cooperative. Patient is in no acute distress.  Skin: Skin is warm and dry. No rash noted.   Cardiovascular: Normal heart rate noted  Respiratory: Normal respiratory effort, no problems with respiration noted  Abdomen: Soft, gravid, appropriate for gestational age. Pain/Pressure: Present     Pelvic:  Cervical exam deferred        Extremities: Normal range of motion.  Edema: Trace  Mental Status: Normal mood and affect. Normal behavior. Normal judgment and thought content.   Assessment and Plan:  Pregnancy: G5P4004 at 1231w5d  There are no diagnoses linked to this encounter. Preterm labor symptoms and general obstetric precautions including but not limited to vaginal bleeding, contractions, leaking of fluid and fetal movement were reviewed in detail with the patient. Please refer to After Visit Summary for other counseling recommendations.  Return in about 2 weeks (around 03/23/2017). Repeat cesarean section at  39 weeks on 04/22/17  Scheryl DarterJames Rome Echavarria, MD

## 2017-03-25 ENCOUNTER — Encounter (HOSPITAL_COMMUNITY): Payer: Self-pay | Admitting: *Deleted

## 2017-03-25 ENCOUNTER — Inpatient Hospital Stay (HOSPITAL_COMMUNITY)
Admit: 2017-03-25 | Discharge: 2017-03-25 | Disposition: A | Discharge status: Home / Self Care | Payer: Medicaid Other | Source: Ambulatory Visit | Attending: Family Medicine | Admitting: Family Medicine

## 2017-03-25 DIAGNOSIS — O34219 Maternal care for unspecified type scar from previous cesarean delivery: Secondary | ICD-10-CM

## 2017-03-25 DIAGNOSIS — O34211 Maternal care for low transverse scar from previous cesarean delivery: Secondary | ICD-10-CM | POA: Insufficient documentation

## 2017-03-25 DIAGNOSIS — Z3A35 35 weeks gestation of pregnancy: Secondary | ICD-10-CM | POA: Insufficient documentation

## 2017-03-25 MED ORDER — LACTATED RINGERS IV SOLN
INTRAVENOUS | Status: DC
  Administered 2017-03-25: 12:00:00 via INTRAVENOUS

## 2017-03-25 MED ORDER — NIFEDIPINE 10 MG PO CAPS
10.0000 mg | ORAL_CAPSULE | Freq: Once | ORAL | Status: AC
  Administered 2017-03-25: 10 mg via ORAL
  Filled 2017-03-25: qty 1

## 2017-03-25 MED ORDER — BETAMETHASONE SOD PHOS & ACET 6 (3-3) MG/ML IJ SUSP
12.0000 mg | Freq: Once | INTRAMUSCULAR | Status: AC
  Administered 2017-03-25: 12 mg via INTRAMUSCULAR
  Filled 2017-03-25: qty 2

## 2017-03-25 NOTE — Discharge Instructions (Signed)

## 2017-03-25 NOTE — MAU Provider Note (Signed)
Chief Complaint:  Contractions   First Provider Initiated Contact with Patient 03/25/17 1108     HPI: Gwendolyn Lynch is a 31 y.o. G5P4004 at 7035w0dwho presents to maternity admissions reporting uterine contractions and back pain since 5am.  No bleeding or leaking.. She reports good fetal movement, denies LOF, vaginal bleeding, vaginal itching/burning, urinary symptoms, h/a, dizziness, n/v, diarrhea, constipation or fever/chills.    She has a planned C/S at 39 wks for previous C/S with uterine window.  Abdominal Pain  This is a new problem. The current episode started today. The onset quality is gradual. The problem occurs intermittently. The pain is located in the suprapubic region, LLQ and RLQ. The abdominal pain radiates to the back. Associated symptoms include diarrhea and nausea. Pertinent negatives include no constipation, dysuria, fever, myalgias or vomiting. Nothing aggravates the pain. The pain is relieved by nothing. She has tried nothing for the symptoms.    RN note: Started contracting at 0500, now every 10 min. No bleeding or leaking.    Past Medical History: Past Medical History:  Diagnosis Date  . Hemorrhoids   . Medical history non-contributory     Past obstetric history: OB History  Gravida Para Term Preterm AB Living  5 4 4  0 0 4  SAB TAB Ectopic Multiple Live Births  0 0 0 0 4    # Outcome Date GA Lbr Len/2nd Weight Sex Delivery Anes PTL Lv  5 Current           4 Term 11/01/15 3363w4d  7 lb 11.8 oz (3.51 kg) M CS-LTranv Spinal  LIV  3 Term 2012 5674w0d  6 lb (2.722 kg) M CS-LTranv Spinal N LIV  2 Term 2009 515w0d  5 lb 11 oz (2.58 kg) M CS-LTranv EPI N LIV  1 Term 2005 695w0d  5 lb 8 oz (2.495 kg) M Vag-Spont EPI  LIV      Past Surgical History: Past Surgical History:  Procedure Laterality Date  . CESAREAN SECTION     x4  . CESAREAN SECTION N/A 11/01/2015   Procedure: CESAREAN SECTION;  Surgeon: Jaymes GraffNaima Dillard, MD;  Location: [facility];  Service: Obstetrics;   Laterality: N/A;    Family History: Family History  Problem Relation Age of Onset  . Diabetes Mother   . Hypertension Mother   . Diabetes Father   . Hypertension Father     Social History: Social History  Substance Use Topics  . Smoking status: Never Smoker  . Smokeless tobacco: Never Used  . Alcohol use No    Allergies: No Known Allergies  Meds:  Prescriptions Prior to Admission  Medication Sig Dispense Refill Last Dose  . ferrous sulfate (FERROUSUL) 325 (65 FE) MG tablet Take 1 tablet (325 mg total) by mouth 2 (two) times daily. 60 tablet 1 03/25/2017 at Unknown time  . Prenatal Vit-Fe Fumarate-FA (PRENATAL MULTIVITAMIN) TABS tablet Take 1 tablet by mouth daily at 12 noon.   03/25/2017 at Unknown time    I have reviewed patient's Past Medical Hx, Surgical Hx, Family Hx, Social Hx, medications and allergies.   ROS:  Review of Systems  Constitutional: Negative for fever.  Gastrointestinal: Positive for abdominal pain, diarrhea and nausea. Negative for constipation and vomiting.  Genitourinary: Negative for dysuria.  Musculoskeletal: Negative for myalgias.   Other systems negative  Physical Exam  Patient Vitals for the past 24 hrs:  BP Temp Temp src Pulse Resp Weight  03/25/17 1104 (!) 108/59 98.2 F (36.8 C) Oral 96  18 -  03/25/17 1049 - - - - - 159 lb 12 oz (72.5 kg)   Constitutional: Well-developed, well-nourished female in no acute distress.  Cardiovascular: normal rate and rhythm Respiratory: normal effort, clear to auscultation bilaterally GI: Abd soft, non-tender, gravid appropriate for gestational age.   No rebound or guarding. MS: Extremities nontender, no edema, normal ROM Neurologic: Alert and oriented x 4.  GU: Neg CVAT.  PELVIC EXAM:  Dilation: 1 Effacement (%): 70 Cervical Position: Middle Station: -2 Presentation: Vertex Exam by:: Mayford Knife CNM  FHT:  Baseline 135 , moderate variability, accelerations present, no decelerations Contractions: q  2-5 mins Irregular     Labs: No results found for this or any previous visit (from the past 24 hour(s)). O/Positive/-- (03/06 0902)  Imaging:  No results found.  MAU Course/MDM: I have ordered labs and reviewed results.  NST reviewed and found to be reassuring.   Irregular contractions noted. Consult Dr Alvester Morin with presentation, exam findings and test results.  Treatments in MAU included IV fluids, Procardia and Betamethasone.   1145:  First dose of Procardia 1224:  Second dose of Procardia and 2nd bag of IVF             UCs persist.  Some spacing between but still "feel strong" 1255:   Third dose Good reduction in UCs after third dose.  Patient states UCs are much milder now Rechecked cervix, unchanged.  Assessment: [facility] at [redacted]w[redacted]d Preterm labor in third trimester without delivery - Plan: Discharge patient  Previous cesarean section complicating pregnancy    Plan: Discharge home Preterm Labor precautions and fetal kick counts Follow up in Office for prenatal visits and recheck of cervix  Encouraged to return here or to other Urgent Care/ED if she develops worsening of symptoms, increase in pain, fever, or other concerning symptoms.   Pt stable at time of discharge.  Wynelle Bourgeois CNM, MSN Certified Nurse-Midwife 03/25/2017 11:08 AM

## 2017-03-25 NOTE — MAU Note (Signed)
Started contracting at 0500, now every 10 min. No bleeding or leaking.

## 2017-03-25 NOTE — Progress Notes (Signed)
Gwendolyn BourgeoisMarie Lynch CNM in to see pt

## 2017-03-27 ENCOUNTER — Other Ambulatory Visit (HOSPITAL_COMMUNITY)
Admission: RE | Admit: 2017-03-27 | Discharge: 2017-03-27 | Disposition: A | Discharge status: Home / Self Care | Payer: Medicaid Other | Source: Ambulatory Visit | Attending: Family Medicine | Admitting: Family Medicine

## 2017-03-27 ENCOUNTER — Ambulatory Visit (INDEPENDENT_AMBULATORY_CARE_PROVIDER_SITE_OTHER): Payer: Medicaid Other | Admitting: Family Medicine

## 2017-03-27 VITALS — BP 120/69 | HR 82 | Wt 159.1 lb

## 2017-03-27 DIAGNOSIS — O34219 Maternal care for unspecified type scar from previous cesarean delivery: Secondary | ICD-10-CM

## 2017-03-27 DIAGNOSIS — Z3493 Encounter for supervision of normal pregnancy, unspecified, third trimester: Secondary | ICD-10-CM | POA: Diagnosis present

## 2017-03-27 DIAGNOSIS — Z113 Encounter for screening for infections with a predominantly sexual mode of transmission: Secondary | ICD-10-CM

## 2017-03-27 NOTE — Patient Instructions (Signed)
Third Trimester of Pregnancy The third trimester is from week 28 through week 40 (months 7 through 9). The third trimester is a time when the unborn baby (fetus) is growing rapidly. At the end of the ninth month, the fetus is about 20 inches in length and weighs 6-10 pounds. Body changes during your third trimester Your body will continue to go through many changes during pregnancy. The changes vary from woman to woman. During the third trimester:  Your weight will continue to increase. You can expect to gain 25-35 pounds (11-16 kg) by the end of the pregnancy.  You may begin to get stretch marks on your hips, abdomen, and breasts.  You may urinate more often because the fetus is moving lower into your pelvis and pressing on your bladder.  You may develop or continue to have heartburn. This is caused by increased hormones that slow down muscles in the digestive tract.  You may develop or continue to have constipation because increased hormones slow digestion and cause the muscles that push waste through your intestines to relax.  You may develop hemorrhoids. These are swollen veins (varicose veins) in the rectum that can itch or be painful.  You may develop swollen, bulging veins (varicose veins) in your legs.  You may have increased body aches in the pelvis, back, or thighs. This is due to weight gain and increased hormones that are relaxing your joints.  You may have changes in your hair. These can include thickening of your hair, rapid growth, and changes in texture. Some women also have hair loss during or after pregnancy, or hair that feels dry or thin. Your hair will most likely return to normal after your baby is born.  Your breasts will continue to grow and they will continue to become tender. A yellow fluid (colostrum) may leak from your breasts. This is the first milk you are producing for your baby.  Your belly button may stick out.  You may notice more swelling in your hands,  face, or ankles.  You may have increased tingling or numbness in your hands, arms, and legs. The skin on your belly may also feel numb.  You may feel short of breath because of your expanding uterus.  You may have more problems sleeping. This can be caused by the size of your belly, increased need to urinate, and an increase in your body's metabolism.  You may notice the fetus "dropping," or moving lower in your abdomen (lightening).  You may have increased vaginal discharge.  You may notice your joints feel loose and you may have pain around your pelvic bone.  What to expect at prenatal visits You will have prenatal exams every 2 weeks until week 36. Then you will have weekly prenatal exams. During a routine prenatal visit:  You will be weighed to make sure you and the baby are growing normally.  Your blood pressure will be taken.  Your abdomen will be measured to track your baby's growth.  The fetal heartbeat will be listened to.  Any test results from the previous visit will be discussed.  You may have a cervical check near your due date to see if your cervix has softened or thinned (effaced).  You will be tested for Group B streptococcus. This happens between 35 and 37 weeks.  Your health care provider may ask you:  What your birth plan is.  How you are feeling.  If you are feeling the baby move.  If you have had   any abnormal symptoms, such as leaking fluid, bleeding, severe headaches, or abdominal cramping.  If you are using any tobacco products, including cigarettes, chewing tobacco, and electronic cigarettes.  If you have any questions.  Other tests or screenings that may be performed during your third trimester include:  Blood tests that check for low iron levels (anemia).  Fetal testing to check the health, activity level, and growth of the fetus. Testing is done if you have certain medical conditions or if there are problems during the  pregnancy.  Nonstress test (NST). This test checks the health of your baby to make sure there are no signs of problems, such as the baby not getting enough oxygen. During this test, a belt is placed around your belly. The baby is made to move, and its heart rate is monitored during movement.  What is false labor? False labor is a condition in which you feel small, irregular tightenings of the muscles in the womb (contractions) that usually go away with rest, changing position, or drinking water. These are called Braxton Hicks contractions. Contractions may last for hours, days, or even weeks before true labor sets in. If contractions come at regular intervals, become more frequent, increase in intensity, or become painful, you should see your health care provider. What are the signs of labor?  Abdominal cramps.  Regular contractions that start at 10 minutes apart and become stronger and more frequent with time.  Contractions that start on the top of the uterus and spread down to the lower abdomen and back.  Increased pelvic pressure and dull back pain.  A watery or bloody mucus discharge that comes from the vagina.  Leaking of amniotic fluid. This is also known as your "water breaking." It could be a slow trickle or a gush. Let your health care provider know if it has a color or strange odor. If you have any of these signs, call your health care provider right away, even if it is before your due date. Follow these instructions at home: Medicines  Follow your health care provider's instructions regarding medicine use. Specific medicines may be either safe or unsafe to take during pregnancy.  Take a prenatal vitamin that contains at least 600 micrograms (mcg) of folic acid.  If you develop constipation, try taking a stool softener if your health care provider approves. Eating and drinking  Eat a balanced diet that includes fresh fruits and vegetables, whole grains, good sources of protein  such as meat, eggs, or tofu, and low-fat dairy. Your health care provider will help you determine the amount of weight gain that is right for you.  Avoid raw meat and uncooked cheese. These carry germs that can cause birth defects in the baby.  If you have low calcium intake from food, talk to your health care provider about whether you should take a daily calcium supplement.  Eat four or five small meals rather than three large meals a day.  Limit foods that are high in fat and processed sugars, such as fried and sweet foods.  To prevent constipation: ? Drink enough fluid to keep your urine clear or pale yellow. ? Eat foods that are high in fiber, such as fresh fruits and vegetables, whole grains, and beans. Activity  Exercise only as directed by your health care provider. Most women can continue their usual exercise routine during pregnancy. Try to exercise for 30 minutes at least 5 days a week. Stop exercising if you experience uterine contractions.  Avoid heavy   lifting.  Do not exercise in extreme heat or humidity, or at high altitudes.  Wear low-heel, comfortable shoes.  Practice good posture.  You may continue to have sex unless your health care provider tells you otherwise. Relieving pain and discomfort  Take frequent breaks and rest with your legs elevated if you have leg cramps or low back pain.  Take warm sitz baths to soothe any pain or discomfort caused by hemorrhoids. Use hemorrhoid cream if your health care provider approves.  Wear a good support bra to prevent discomfort from breast tenderness.  If you develop varicose veins: ? Wear support pantyhose or compression stockings as told by your healthcare provider. ? Elevate your feet for 15 minutes, 3-4 times a day. Prenatal care  Write down your questions. Take them to your prenatal visits.  Keep all your prenatal visits as told by your health care provider. This is important. Safety  Wear your seat belt at  all times when driving.  Make a list of emergency phone numbers, including numbers for family, friends, the hospital, and police and fire departments. General instructions  Avoid cat litter boxes and soil used by cats. These carry germs that can cause birth defects in the baby. If you have a cat, ask someone to clean the litter box for you.  Do not travel far distances unless it is absolutely necessary and only with the approval of your health care provider.  Do not use hot tubs, steam rooms, or saunas.  Do not drink alcohol.  Do not use any products that contain nicotine or tobacco, such as cigarettes and e-cigarettes. If you need help quitting, ask your health care provider.  Do not use any medicinal herbs or unprescribed drugs. These chemicals affect the formation and growth of the baby.  Do not douche or use tampons or scented sanitary pads.  Do not cross your legs for long periods of time.  To prepare for the arrival of your baby: ? Take prenatal classes to understand, practice, and ask questions about labor and delivery. ? Make a trial run to the hospital. ? Visit the hospital and tour the maternity area. ? Arrange for maternity or paternity leave through employers. ? Arrange for family and friends to take care of pets while you are in the hospital. ? Purchase a rear-facing car seat and make sure you know how to install it in your car. ? Pack your hospital bag. ? Prepare the baby's nursery. Make sure to remove all pillows and stuffed animals from the baby's crib to prevent suffocation.  Visit your dentist if you have not gone during your pregnancy. Use a soft toothbrush to brush your teeth and be gentle when you floss. Contact a health care provider if:  You are unsure if you are in labor or if your water has broken.  You become dizzy.  You have mild pelvic cramps, pelvic pressure, or nagging pain in your abdominal area.  You have lower back pain.  You have persistent  nausea, vomiting, or diarrhea.  You have an unusual or bad smelling vaginal discharge.  You have pain when you urinate. Get help right away if:  Your water breaks before 37 weeks.  You have regular contractions less than 5 minutes apart before 37 weeks.  You have a fever.  You are leaking fluid from your vagina.  You have spotting or bleeding from your vagina.  You have severe abdominal pain or cramping.  You have rapid weight loss or weight gain.    You have shortness of breath with chest pain.  You notice sudden or extreme swelling of your face, hands, ankles, feet, or legs.  Your baby makes fewer than 10 movements in 2 hours.  You have severe headaches that do not go away when you take medicine.  You have vision changes. Summary  The third trimester is from week 28 through week 40, months 7 through 9. The third trimester is a time when the unborn baby (fetus) is growing rapidly.  During the third trimester, your discomfort may increase as you and your baby continue to gain weight. You may have abdominal, leg, and back pain, sleeping problems, and an increased need to urinate.  During the third trimester your breasts will keep growing and they will continue to become tender. A yellow fluid (colostrum) may leak from your breasts. This is the first milk you are producing for your baby.  False labor is a condition in which you feel small, irregular tightenings of the muscles in the womb (contractions) that eventually go away. These are called Braxton Hicks contractions. Contractions may last for hours, days, or even weeks before true labor sets in.  Signs of labor can include: abdominal cramps; regular contractions that start at 10 minutes apart and become stronger and more frequent with time; watery or bloody mucus discharge that comes from the vagina; increased pelvic pressure and dull back pain; and leaking of amniotic fluid. This information is not intended to replace advice  given to you by your health care provider. Make sure you discuss any questions you have with your health care provider. Document Released: 09/02/2001 Document Revised: 02/14/2016 Document Reviewed: 11/09/2012 Elsevier Interactive Patient Education  2017 [employer]  

## 2017-03-27 NOTE — Progress Notes (Signed)
   PRENATAL VISIT NOTE  Subjective:  Gwendolyn Lynch is a 31 y.o. G5P4004 at 486w2d being seen today for ongoing prenatal care.  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  Patient reports no complaints.  Contractions: Regular. Vag. Bleeding: Scant.  Movement: Present. Denies leaking of fluid.   The following portions of the patient's history were reviewed and updated as appropriate: allergies, current medications, past family history, past medical history, past social history, past surgical history and problem list. Problem list updated.  Objective:   Vitals:   03/27/17 1131  BP: 120/69  Pulse: 82  Weight: 159 lb 1.6 oz (72.2 kg)    Fetal Status: Fetal Heart Rate (bpm): 145 Fundal Height: 36 cm Movement: Present  Presentation: Vertex  General:  Alert, oriented and cooperative. Patient is in no acute distress.  Skin: Skin is warm and dry. No rash noted.   Cardiovascular: Normal heart rate noted  Respiratory: Normal respiratory effort, no problems with respiration noted  Abdomen: Soft, gravid, appropriate for gestational age. Pain/Pressure: Present     Pelvic:  Cervical exam performed Dilation: 1 Effacement (%): 20 Station: -3  Extremities: Normal range of motion.  Edema: Mild pitting, slight indentation  Mental Status: Normal mood and affect. Normal behavior. Normal judgment and thought content.   Assessment and Plan:  Pregnancy: G5P4004 at 5186w2d  1. Previous cesarean section complicating pregnancy For Repeat #4 scheduled  2. Encounter for supervision of low-risk pregnancy in third trimester Cultures today - Strep Gp B NAA - Cervicovaginal ancillary only  Preterm labor symptoms and general obstetric precautions including but not limited to vaginal  bleeding, contractions, leaking of fluid and fetal movement were reviewed in detail with the patient. Please refer to After Visit Summary for other counseling recommendations.  Return in 1 week (on 04/03/2017).   Reva Boresanya S Pratyush Ammon, MD

## 2017-03-29 LAB — STREP GP B NAA: Strep Gp B NAA: POSITIVE — AB

## 2017-03-30 LAB — CERVICOVAGINAL ANCILLARY ONLY
CHLAMYDIA, DNA PROBE: NEGATIVE
NEISSERIA GONORRHEA: NEGATIVE

## 2017-04-09 ENCOUNTER — Ambulatory Visit (INDEPENDENT_AMBULATORY_CARE_PROVIDER_SITE_OTHER): Payer: Medicaid Other | Admitting: Advanced Practice Midwife

## 2017-04-09 ENCOUNTER — Encounter: Payer: Self-pay | Admitting: Advanced Practice Midwife

## 2017-04-09 VITALS — BP 124/66 | HR 89 | Wt 163.6 lb

## 2017-04-09 DIAGNOSIS — Z3493 Encounter for supervision of normal pregnancy, unspecified, third trimester: Secondary | ICD-10-CM

## 2017-04-09 DIAGNOSIS — O34219 Maternal care for unspecified type scar from previous cesarean delivery: Secondary | ICD-10-CM

## 2017-04-09 NOTE — Patient Instructions (Signed)
Third Trimester of Pregnancy The third trimester is from week 28 through week 40 (months 7 through 9). The third trimester is a time when the unborn baby (fetus) is growing rapidly. At the end of the ninth month, the fetus is about 20 inches in length and weighs 6-10 pounds. Body changes during your third trimester Your body will continue to go through many changes during pregnancy. The changes vary from woman to woman. During the third trimester:  Your weight will continue to increase. You can expect to gain 25-35 pounds (11-16 kg) by the end of the pregnancy.  You may begin to get stretch marks on your hips, abdomen, and breasts.  You may urinate more often because the fetus is moving lower into your pelvis and pressing on your bladder.  You may develop or continue to have heartburn. This is caused by increased hormones that slow down muscles in the digestive tract.  You may develop or continue to have constipation because increased hormones slow digestion and cause the muscles that push waste through your intestines to relax.  You may develop hemorrhoids. These are swollen veins (varicose veins) in the rectum that can itch or be painful.  You may develop swollen, bulging veins (varicose veins) in your legs.  You may have increased body aches in the pelvis, back, or thighs. This is due to weight gain and increased hormones that are relaxing your joints.  You may have changes in your hair. These can include thickening of your hair, rapid growth, and changes in texture. Some women also have hair loss during or after pregnancy, or hair that feels dry or thin. Your hair will most likely return to normal after your baby is born.  Your breasts will continue to grow and they will continue to become tender. A yellow fluid (colostrum) may leak from your breasts. This is the first milk you are producing for your baby.  Your belly button may stick out.  You may notice more swelling in your hands,  face, or ankles.  You may have increased tingling or numbness in your hands, arms, and legs. The skin on your belly may also feel numb.  You may feel short of breath because of your expanding uterus.  You may have more problems sleeping. This can be caused by the size of your belly, increased need to urinate, and an increase in your body's metabolism.  You may notice the fetus "dropping," or moving lower in your abdomen (lightening).  You may have increased vaginal discharge.  You may notice your joints feel loose and you may have pain around your pelvic bone.  What to expect at prenatal visits You will have prenatal exams every 2 weeks until week 36. Then you will have weekly prenatal exams. During a routine prenatal visit:  You will be weighed to make sure you and the baby are growing normally.  Your blood pressure will be taken.  Your abdomen will be measured to track your baby's growth.  The fetal heartbeat will be listened to.  Any test results from the previous visit will be discussed.  You may have a cervical check near your due date to see if your cervix has softened or thinned (effaced).  You will be tested for Group B streptococcus. This happens between 35 and 37 weeks.  Your health care provider may ask you:  What your birth plan is.  How you are feeling.  If you are feeling the baby move.  If you have had   any abnormal symptoms, such as leaking fluid, bleeding, severe headaches, or abdominal cramping.  If you are using any tobacco products, including cigarettes, chewing tobacco, and electronic cigarettes.  If you have any questions.  Other tests or screenings that may be performed during your third trimester include:  Blood tests that check for low iron levels (anemia).  Fetal testing to check the health, activity level, and growth of the fetus. Testing is done if you have certain medical conditions or if there are problems during the  pregnancy.  Nonstress test (NST). This test checks the health of your baby to make sure there are no signs of problems, such as the baby not getting enough oxygen. During this test, a belt is placed around your belly. The baby is made to move, and its heart rate is monitored during movement.  What is false labor? False labor is a condition in which you feel small, irregular tightenings of the muscles in the womb (contractions) that usually go away with rest, changing position, or drinking water. These are called Braxton Hicks contractions. Contractions may last for hours, days, or even weeks before true labor sets in. If contractions come at regular intervals, become more frequent, increase in intensity, or become painful, you should see your health care provider. What are the signs of labor?  Abdominal cramps.  Regular contractions that start at 10 minutes apart and become stronger and more frequent with time.  Contractions that start on the top of the uterus and spread down to the lower abdomen and back.  Increased pelvic pressure and dull back pain.  A watery or bloody mucus discharge that comes from the vagina.  Leaking of amniotic fluid. This is also known as your "water breaking." It could be a slow trickle or a gush. Let your health care provider know if it has a color or strange odor. If you have any of these signs, call your health care provider right away, even if it is before your due date. Follow these instructions at home: Medicines  Follow your health care provider's instructions regarding medicine use. Specific medicines may be either safe or unsafe to take during pregnancy.  Take a prenatal vitamin that contains at least 600 micrograms (mcg) of folic acid.  If you develop constipation, try taking a stool softener if your health care provider approves. Eating and drinking  Eat a balanced diet that includes fresh fruits and vegetables, whole grains, good sources of protein  such as meat, eggs, or tofu, and low-fat dairy. Your health care provider will help you determine the amount of weight gain that is right for you.  Avoid raw meat and uncooked cheese. These carry germs that can cause birth defects in the baby.  If you have low calcium intake from food, talk to your health care provider about whether you should take a daily calcium supplement.  Eat four or five small meals rather than three large meals a day.  Limit foods that are high in fat and processed sugars, such as fried and sweet foods.  To prevent constipation: ? Drink enough fluid to keep your urine clear or pale yellow. ? Eat foods that are high in fiber, such as fresh fruits and vegetables, whole grains, and beans. Activity  Exercise only as directed by your health care provider. Most women can continue their usual exercise routine during pregnancy. Try to exercise for 30 minutes at least 5 days a week. Stop exercising if you experience uterine contractions.  Avoid heavy   lifting.  Do not exercise in extreme heat or humidity, or at high altitudes.  Wear low-heel, comfortable shoes.  Practice good posture.  You may continue to have sex unless your health care provider tells you otherwise. Relieving pain and discomfort  Take frequent breaks and rest with your legs elevated if you have leg cramps or low back pain.  Take warm sitz baths to soothe any pain or discomfort caused by hemorrhoids. Use hemorrhoid cream if your health care provider approves.  Wear a good support bra to prevent discomfort from breast tenderness.  If you develop varicose veins: ? Wear support pantyhose or compression stockings as told by your healthcare provider. ? Elevate your feet for 15 minutes, 3-4 times a day. Prenatal care  Write down your questions. Take them to your prenatal visits.  Keep all your prenatal visits as told by your health care provider. This is important. Safety  Wear your seat belt at  all times when driving.  Make a list of emergency phone numbers, including numbers for family, friends, the hospital, and police and fire departments. General instructions  Avoid cat litter boxes and soil used by cats. These carry germs that can cause birth defects in the baby. If you have a cat, ask someone to clean the litter box for you.  Do not travel far distances unless it is absolutely necessary and only with the approval of your health care provider.  Do not use hot tubs, steam rooms, or saunas.  Do not drink alcohol.  Do not use any products that contain nicotine or tobacco, such as cigarettes and e-cigarettes. If you need help quitting, ask your health care provider.  Do not use any medicinal herbs or unprescribed drugs. These chemicals affect the formation and growth of the baby.  Do not douche or use tampons or scented sanitary pads.  Do not cross your legs for long periods of time.  To prepare for the arrival of your baby: ? Take prenatal classes to understand, practice, and ask questions about labor and delivery. ? Make a trial run to the hospital. ? Visit the hospital and tour the maternity area. ? Arrange for maternity or paternity leave through employers. ? Arrange for family and friends to take care of pets while you are in the hospital. ? Purchase a rear-facing car seat and make sure you know how to install it in your car. ? Pack your hospital bag. ? Prepare the baby's nursery. Make sure to remove all pillows and stuffed animals from the baby's crib to prevent suffocation.  Visit your dentist if you have not gone during your pregnancy. Use a soft toothbrush to brush your teeth and be gentle when you floss. Contact a health care provider if:  You are unsure if you are in labor or if your water has broken.  You become dizzy.  You have mild pelvic cramps, pelvic pressure, or nagging pain in your abdominal area.  You have lower back pain.  You have persistent  nausea, vomiting, or diarrhea.  You have an unusual or bad smelling vaginal discharge.  You have pain when you urinate. Get help right away if:  Your water breaks before 37 weeks.  You have regular contractions less than 5 minutes apart before 37 weeks.  You have a fever.  You are leaking fluid from your vagina.  You have spotting or bleeding from your vagina.  You have severe abdominal pain or cramping.  You have rapid weight loss or weight gain.    You have shortness of breath with chest pain.  You notice sudden or extreme swelling of your face, hands, ankles, feet, or legs.  Your baby makes fewer than 10 movements in 2 hours.  You have severe headaches that do not go away when you take medicine.  You have vision changes. Summary  The third trimester is from week 28 through week 40, months 7 through 9. The third trimester is a time when the unborn baby (fetus) is growing rapidly.  During the third trimester, your discomfort may increase as you and your baby continue to gain weight. You may have abdominal, leg, and back pain, sleeping problems, and an increased need to urinate.  During the third trimester your breasts will keep growing and they will continue to become tender. A yellow fluid (colostrum) may leak from your breasts. This is the first milk you are producing for your baby.  False labor is a condition in which you feel small, irregular tightenings of the muscles in the womb (contractions) that eventually go away. These are called Braxton Hicks contractions. Contractions may last for hours, days, or even weeks before true labor sets in.  Signs of labor can include: abdominal cramps; regular contractions that start at 10 minutes apart and become stronger and more frequent with time; watery or bloody mucus discharge that comes from the vagina; increased pelvic pressure and dull back pain; and leaking of amniotic fluid. This information is not intended to replace advice  given to you by your health care provider. Make sure you discuss any questions you have with your health care provider. Document Released: 09/02/2001 Document Revised: 02/14/2016 Document Reviewed: 11/09/2012 Elsevier Interactive Patient Education  2017 [employer]  

## 2017-04-09 NOTE — Progress Notes (Signed)
   PRENATAL VISIT NOTE  Subjective:  Gwendolyn Lynch is a 31 y.o. [id number] at 47108w1d being seen today for ongoing prenatal care.  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  Patient reports irregular contractions .  Contractions: Irregular. Vag. Bleeding: None.  Movement: Present. Denies leaking of fluid.   The following portions of the patient's history were reviewed and updated as appropriate: allergies, current medications, past family history, past medical history, past social history, past surgical history and problem list. Problem list updated.  Objective:   Vitals:   04/09/17 1108  BP: 124/66  Pulse: 89  Weight: 163 lb 9.6 oz (74.2 kg)    Fetal Status: Fetal Heart Rate (bpm): 160   Movement: Present     General:  Alert, oriented and cooperative. Patient is in no acute distress.  Skin: Skin is warm and dry. No rash noted.   Cardiovascular: Normal heart rate noted  Respiratory: Normal respiratory effort, no problems with respiration noted  Abdomen: Soft, gravid, appropriate for gestational age.  Pain/Pressure: Present     Pelvic: Cervical exam performed        Extremities: Normal range of motion.  Edema: Mild pitting, slight indentation  Mental Status:  Normal mood and affect. Normal behavior. Normal judgment and thought content.   Assessment and Plan:  Pregnancy: G5P4004 at 66108w1d  1. Previous cesarean section complicating pregnancy - c-section scheduled for 04/22/17  2. Encounter for supervision of low-risk pregnancy in third trimester - routine care  Term labor symptoms and general obstetric precautions including but not limited to vaginal bleeding, contractions, leaking of fluid and fetal movement were reviewed in detail with the  patient. Please refer to After Visit Summary for other counseling recommendations.  Return in about 1 week (around 04/16/2017).   Thressa ShellerHeather Hogan, CNM

## 2017-04-14 ENCOUNTER — Telehealth (HOSPITAL_COMMUNITY): Payer: Self-pay | Admitting: *Deleted

## 2017-04-14 NOTE — Telephone Encounter (Signed)
Preadmission screen  

## 2017-04-15 ENCOUNTER — Telehealth (HOSPITAL_COMMUNITY): Payer: Self-pay | Admitting: *Deleted

## 2017-04-15 NOTE — Telephone Encounter (Signed)
Preadmission screen  

## 2017-04-16 ENCOUNTER — Encounter (HOSPITAL_COMMUNITY): Payer: Self-pay

## 2017-04-16 ENCOUNTER — Encounter: Payer: Self-pay | Admitting: Family Medicine

## 2017-04-16 ENCOUNTER — Ambulatory Visit (INDEPENDENT_AMBULATORY_CARE_PROVIDER_SITE_OTHER): Payer: Medicaid Other | Admitting: Family Medicine

## 2017-04-16 VITALS — BP 122/73 | HR 84 | Wt 161.2 lb

## 2017-04-16 DIAGNOSIS — O99019 Anemia complicating pregnancy, unspecified trimester: Secondary | ICD-10-CM

## 2017-04-16 DIAGNOSIS — O34219 Maternal care for unspecified type scar from previous cesarean delivery: Secondary | ICD-10-CM

## 2017-04-16 DIAGNOSIS — R8271 Bacteriuria: Secondary | ICD-10-CM

## 2017-04-16 DIAGNOSIS — O99013 Anemia complicating pregnancy, third trimester: Secondary | ICD-10-CM

## 2017-04-16 DIAGNOSIS — Z3493 Encounter for supervision of normal pregnancy, unspecified, third trimester: Secondary | ICD-10-CM

## 2017-04-16 NOTE — Progress Notes (Signed)
   PRENATAL VISIT NOTE  Subjective:  Gwendolyn Lynch is a 31 y.o. [id number] at 4648w1d being seen today for ongoing prenatal care.  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  Patient reports no complaints.  Contractions: Irregular. Vag. Bleeding: None.  Movement: Present. Denies leaking of fluid.   The following portions of the patient's history were reviewed and updated as appropriate: allergies, current medications, past family history, past medical history, past social history, past surgical history and problem list. Problem list updated.  Objective:   Vitals:   04/16/17 1539  BP: 122/73  Pulse: 84  Weight: 161 lb 3.2 oz (73.1 kg)    Fetal Status: Fetal Heart Rate (bpm): 154 Fundal Height: 39 cm Movement: Present  Presentation: Vertex  General:  Alert, oriented and cooperative. Patient is in no acute distress.  Skin: Skin is warm and dry. No rash noted.   Cardiovascular: Normal heart rate noted  Respiratory: Normal respiratory effort, no problems with respiration noted  Abdomen: Soft, gravid, appropriate for gestational age.  Pain/Pressure: Present     Pelvic: Cervical exam deferred        Extremities: Normal range of motion.  Edema: Trace  Mental Status:  Normal mood and affect. Normal behavior. Normal judgment and thought content.   Assessment and Plan:  Pregnancy: G5P4004 at 3848w1d  1. Encounter for supervision of low-risk pregnancy in third trimester FHT and FH normal  2. Previous cesarean section complicating pregnancy Repeat c/s scheduled  3. GBS bacteriuria  4. Anemia affecting pregnancy, antepartum   Term labor symptoms and general obstetric precautions including but not limited to vaginal bleeding, contractions, leaking of fluid and  fetal movement were reviewed in detail with the patient. Please refer to After Visit Summary for other counseling recommendations.  Return in about 5 weeks (around 05/21/2017) for Postpartum Exam.   Levie HeritageJacob J Stinson, DO

## 2017-04-21 ENCOUNTER — Encounter (HOSPITAL_COMMUNITY)
Admission: RE | Admit: 2017-04-21 | Discharge: 2017-04-21 | Disposition: A | Discharge status: Home / Self Care | Payer: Medicaid Other | Source: Ambulatory Visit | Attending: Family Medicine | Admitting: Family Medicine

## 2017-04-21 LAB — CBC
HCT: 29.3 % — ABNORMAL LOW (ref 36.0–46.0)
Hemoglobin: 9.6 g/dL — ABNORMAL LOW (ref 12.0–15.0)
MCH: 25 pg — AB (ref 26.0–34.0)
MCHC: 32.8 g/dL (ref 30.0–36.0)
MCV: 76.3 fL — AB (ref 78.0–100.0)
PLATELETS: 277 10*3/uL (ref 150–400)
RBC: 3.84 MIL/uL — ABNORMAL LOW (ref 3.87–5.11)
RDW: 17 % — AB (ref 11.5–15.5)
WBC: 8.5 10*3/uL (ref 4.0–10.5)

## 2017-04-21 LAB — TYPE AND SCREEN
ABO/RH(D): O POS
Antibody Screen: NEGATIVE

## 2017-04-21 NOTE — Patient Instructions (Signed)
20 Gwendolyn Lynch  04/21/2017   Your procedure is scheduled on:  04/22/2017  Enter through the [facility] of [facility] at 0730 AM.  Pick up the phone at the desk and dial [phone].   Call this number if you have problems the morning of surgery: [phone]   Remember:   Do not eat food:After Midnight.  Do not drink clear liquids: After Midnight.  Take these medicines the morning of surgery with A SIP OF WATER: none   Do not wear jewelry, make-up or nail polish.  Do not wear lotions, powders, or perfumes. Do not wear deodorant.  Do not shave 48 hours prior to surgery.  Do not bring valuables to the hospital.  [facility] is not   responsible for any belongings or valuables brought to the hospital.  Contacts, dentures or bridgework may not be worn into surgery.  Leave suitcase in the car. After surgery it may be brought to your room.  For patients admitted to the hospital, checkout time is 11:00 AM the day of              discharge.   Patients discharged the day of surgery will not be allowed to drive             home.  Name and phone number of your driver: na  Special Instructions:   N/A   Please read over the following fact sheets that you were given:   Surgical Site Infection Prevention

## 2017-04-21 NOTE — Anesthesia Preprocedure Evaluation (Signed)
Anesthesia Evaluation  Patient identified by MRN, date of birth, ID band Patient awake    Reviewed: Allergy & Precautions, NPO status , Patient's Chart, lab work & pertinent test results  History of Anesthesia Complications Negative for: history of anesthetic complications  Airway Mallampati: II  TM Distance: >3 FB Neck ROM: Full    Dental  (+) Teeth Intact, Dental Advisory Given   Pulmonary neg pulmonary ROS,    Pulmonary exam normal breath sounds clear to auscultation       Cardiovascular negative cardio ROS Normal cardiovascular exam Rhythm:Regular Rate:Normal     Neuro/Psych negative neurological ROS  negative psych ROS   GI/Hepatic negative GI ROS, Neg liver ROS,   Endo/Other  negative endocrine ROS  Renal/GU negative Renal ROS     Musculoskeletal negative musculoskeletal ROS (+)   Abdominal   Peds  Hematology  (+) Blood dyscrasia, anemia , Plt 245k   Anesthesia Other Findings Day of surgery medications reviewed with the patient.  Reproductive/Obstetrics (+) Pregnancy                             Anesthesia Physical  Anesthesia Plan  ASA: II  Anesthesia Plan: Spinal   Post-op Pain Management:    Induction:   PONV Risk Score and Plan: 2 and Ondansetron, Dexamethasone and Treatment may vary due to age or medical condition  Airway Management Planned:   Additional Equipment:   Intra-op Plan:   Post-operative Plan:   Informed Consent: I have reviewed the patients History and Physical, chart, labs and discussed the procedure including the risks, benefits and alternatives for the proposed anesthesia with the patient or authorized representative who has indicated his/her understanding and acceptance.   Dental advisory given  Plan Discussed with: CRNA  Anesthesia Plan Comments: (Discussed risks and benefits of and differences between spinal and general. Discussed risks of  spinal including headache, backache, failure, bleeding, infection, and nerve damage. Patient consents to spinal. Questions answered. Coagulation studies and platelet count acceptable.  )        Anesthesia Quick Evaluation

## 2017-04-22 ENCOUNTER — Inpatient Hospital Stay (HOSPITAL_COMMUNITY)
Admission: RE | Admit: 2017-04-22 | Discharge: 2017-04-24 | DRG: 766 | Disposition: A | Discharge status: Home / Self Care | Payer: Medicaid Other | Source: Ambulatory Visit | Attending: Family Medicine | Admitting: Family Medicine

## 2017-04-22 ENCOUNTER — Encounter (HOSPITAL_COMMUNITY)
Admission: RE | Disposition: A | Discharge status: Home / Self Care | Payer: Self-pay | Source: Ambulatory Visit | Attending: Family Medicine

## 2017-04-22 ENCOUNTER — Inpatient Hospital Stay (HOSPITAL_COMMUNITY): Payer: Medicaid Other | Admitting: Anesthesiology

## 2017-04-22 ENCOUNTER — Encounter (HOSPITAL_COMMUNITY): Payer: Self-pay | Admitting: *Deleted

## 2017-04-22 DIAGNOSIS — O99824 Streptococcus B carrier state complicating childbirth: Secondary | ICD-10-CM | POA: Diagnosis present

## 2017-04-22 DIAGNOSIS — O9902 Anemia complicating childbirth: Secondary | ICD-10-CM | POA: Diagnosis present

## 2017-04-22 DIAGNOSIS — O34211 Maternal care for low transverse scar from previous cesarean delivery: Secondary | ICD-10-CM | POA: Diagnosis present

## 2017-04-22 DIAGNOSIS — D569 Thalassemia, unspecified: Secondary | ICD-10-CM | POA: Diagnosis present

## 2017-04-22 DIAGNOSIS — O99019 Anemia complicating pregnancy, unspecified trimester: Secondary | ICD-10-CM | POA: Diagnosis present

## 2017-04-22 DIAGNOSIS — D563 Thalassemia minor: Secondary | ICD-10-CM

## 2017-04-22 DIAGNOSIS — Z3A39 39 weeks gestation of pregnancy: Secondary | ICD-10-CM | POA: Diagnosis not present

## 2017-04-22 DIAGNOSIS — Z3493 Encounter for supervision of normal pregnancy, unspecified, third trimester: Secondary | ICD-10-CM

## 2017-04-22 DIAGNOSIS — O34219 Maternal care for unspecified type scar from previous cesarean delivery: Secondary | ICD-10-CM | POA: Diagnosis present

## 2017-04-22 DIAGNOSIS — R8271 Bacteriuria: Secondary | ICD-10-CM | POA: Diagnosis present

## 2017-04-22 DIAGNOSIS — Z3490 Encounter for supervision of normal pregnancy, unspecified, unspecified trimester: Secondary | ICD-10-CM

## 2017-04-22 DIAGNOSIS — Z98891 History of uterine scar from previous surgery: Secondary | ICD-10-CM

## 2017-04-22 LAB — RPR: RPR Ser Ql: NONREACTIVE

## 2017-04-22 SURGERY — Surgical Case
Anesthesia: Spinal

## 2017-04-22 MED ORDER — SCOPOLAMINE 1 MG/3DAYS TD PT72
MEDICATED_PATCH | TRANSDERMAL | Status: DC | PRN
  Administered 2017-04-22: 1 via TRANSDERMAL

## 2017-04-22 MED ORDER — MENTHOL 3 MG MT LOZG: 1.0000 | LOZENGE | OROMUCOSAL | Status: DC | PRN

## 2017-04-22 MED ORDER — OXYCODONE HCL 5 MG PO TABS: 5.0000 mg | ORAL_TABLET | ORAL | Status: DC | PRN

## 2017-04-22 MED ORDER — TETANUS-DIPHTH-ACELL PERTUSSIS 5-2.5-18.5 LF-MCG/0.5 IM SUSP: 0.5000 mL | Freq: Once | INTRAMUSCULAR | Status: DC

## 2017-04-22 MED ORDER — DIPHENHYDRAMINE HCL 25 MG PO CAPS: 25.0000 mg | ORAL_CAPSULE | Freq: Four times a day (QID) | ORAL | Status: DC | PRN

## 2017-04-22 MED ORDER — COCONUT OIL OIL
1.0000 "application " | TOPICAL_OIL | Status: DC | PRN
  Administered 2017-04-24: 1 via TOPICAL
  Filled 2017-04-22: qty 120

## 2017-04-22 MED ORDER — CEFAZOLIN SODIUM-DEXTROSE 2-4 GM/100ML-% IV SOLN
INTRAVENOUS | Status: AC
  Filled 2017-04-22: qty 100

## 2017-04-22 MED ORDER — IBUPROFEN 600 MG PO TABS
600.0000 mg | ORAL_TABLET | Freq: Four times a day (QID) | ORAL | Status: DC
  Administered 2017-04-23 – 2017-04-24 (×7): 600 mg via ORAL
  Filled 2017-04-22 (×7): qty 1

## 2017-04-22 MED ORDER — LACTATED RINGERS IV SOLN
INTRAVENOUS | Status: DC
  Administered 2017-04-22 – 2017-04-23 (×2): via INTRAVENOUS

## 2017-04-22 MED ORDER — OXYCODONE HCL 5 MG PO TABS: 10.0000 mg | ORAL_TABLET | ORAL | Status: DC | PRN

## 2017-04-22 MED ORDER — FENTANYL CITRATE (PF) 100 MCG/2ML IJ SOLN
INTRAMUSCULAR | Status: AC
  Filled 2017-04-22: qty 2

## 2017-04-22 MED ORDER — NIFEDIPINE 10 MG PO CAPS: 20.0000 mg | ORAL_CAPSULE | ORAL | Status: DC | PRN

## 2017-04-22 MED ORDER — ONDANSETRON HCL 4 MG/2ML IJ SOLN: 4.0000 mg | Freq: Once | INTRAMUSCULAR | Status: DC | PRN

## 2017-04-22 MED ORDER — SENNOSIDES-DOCUSATE SODIUM 8.6-50 MG PO TABS
2.0000 | ORAL_TABLET | ORAL | Status: DC
  Administered 2017-04-23 (×2): 2 via ORAL
  Filled 2017-04-22 (×2): qty 2

## 2017-04-22 MED ORDER — OXYTOCIN 10 UNIT/ML IJ SOLN
INTRAMUSCULAR | Status: AC
  Filled 2017-04-22: qty 4

## 2017-04-22 MED ORDER — SCOPOLAMINE 1 MG/3DAYS TD PT72
MEDICATED_PATCH | TRANSDERMAL | Status: AC
  Filled 2017-04-22: qty 1

## 2017-04-22 MED ORDER — OXYTOCIN 40 UNITS IN LACTATED RINGERS INFUSION - SIMPLE MED: 2.5000 [IU]/h | INTRAVENOUS | Status: AC

## 2017-04-22 MED ORDER — FENTANYL CITRATE (PF) 100 MCG/2ML IJ SOLN
INTRAMUSCULAR | Status: DC | PRN
  Administered 2017-04-22: 25 ug via INTRATHECAL

## 2017-04-22 MED ORDER — NIFEDIPINE 10 MG PO CAPS: 20.0000 mg | ORAL_CAPSULE | Freq: Once | ORAL | Status: DC

## 2017-04-22 MED ORDER — ONDANSETRON HCL 4 MG/2ML IJ SOLN
INTRAMUSCULAR | Status: DC | PRN
  Administered 2017-04-22: 4 mg via INTRAVENOUS

## 2017-04-22 MED ORDER — SIMETHICONE 80 MG PO CHEW: 80.0000 mg | CHEWABLE_TABLET | ORAL | Status: DC | PRN

## 2017-04-22 MED ORDER — BUPIVACAINE HCL (PF) 0.25 % IJ SOLN
INTRAMUSCULAR | Status: AC
  Filled 2017-04-22: qty 10

## 2017-04-22 MED ORDER — ACETAMINOPHEN 325 MG PO TABS: 650.0000 mg | ORAL_TABLET | ORAL | Status: DC | PRN

## 2017-04-22 MED ORDER — DIBUCAINE 1 % RE OINT: 1.0000 "application " | TOPICAL_OINTMENT | RECTAL | Status: DC | PRN

## 2017-04-22 MED ORDER — MEPERIDINE HCL 25 MG/ML IJ SOLN: 6.2500 mg | INTRAMUSCULAR | Status: DC | PRN

## 2017-04-22 MED ORDER — SIMETHICONE 80 MG PO CHEW
80.0000 mg | CHEWABLE_TABLET | ORAL | Status: DC
  Administered 2017-04-23 (×2): 80 mg via ORAL
  Filled 2017-04-22 (×2): qty 1

## 2017-04-22 MED ORDER — MORPHINE SULFATE (PF) 0.5 MG/ML IJ SOLN
INTRAMUSCULAR | Status: DC | PRN
  Administered 2017-04-22: .2 mg via INTRATHECAL

## 2017-04-22 MED ORDER — PHENYLEPHRINE 8 MG IN D5W 100 ML (0.08MG/ML) PREMIX OPTIME
INJECTION | INTRAVENOUS | Status: DC | PRN
  Administered 2017-04-22: 60 ug/min via INTRAVENOUS

## 2017-04-22 MED ORDER — BUPIVACAINE IN DEXTROSE 0.75-8.25 % IT SOLN
INTRATHECAL | Status: DC | PRN
  Administered 2017-04-22: 1.5 mL via INTRATHECAL

## 2017-04-22 MED ORDER — PHENYLEPHRINE HCL 10 MG/ML IJ SOLN
INTRAMUSCULAR | Status: DC | PRN
  Administered 2017-04-22 (×2): 80 ug via INTRAVENOUS

## 2017-04-22 MED ORDER — PRENATAL MULTIVITAMIN CH
1.0000 | ORAL_TABLET | Freq: Every day | ORAL | Status: DC
  Administered 2017-04-23 – 2017-04-24 (×2): 1 via ORAL
  Filled 2017-04-22 (×2): qty 1

## 2017-04-22 MED ORDER — ZOLPIDEM TARTRATE 5 MG PO TABS: 5.0000 mg | ORAL_TABLET | Freq: Every evening | ORAL | Status: DC | PRN

## 2017-04-22 MED ORDER — PHENYLEPHRINE 8 MG IN D5W 100 ML (0.08MG/ML) PREMIX OPTIME
INJECTION | INTRAVENOUS | Status: AC
  Filled 2017-04-22: qty 100

## 2017-04-22 MED ORDER — LACTATED RINGERS IV SOLN
INTRAVENOUS | Status: DC | PRN
  Administered 2017-04-22: 40 [IU] via INTRAVENOUS

## 2017-04-22 MED ORDER — ONDANSETRON HCL 4 MG/2ML IJ SOLN
INTRAMUSCULAR | Status: AC
Start: 2017-04-22 — End: ?
  Filled 2017-04-22: qty 2

## 2017-04-22 MED ORDER — FENTANYL CITRATE (PF) 100 MCG/2ML IJ SOLN: 25.0000 ug | INTRAMUSCULAR | Status: DC | PRN

## 2017-04-22 MED ORDER — DEXAMETHASONE SODIUM PHOSPHATE 10 MG/ML IJ SOLN
INTRAMUSCULAR | Status: DC | PRN
  Administered 2017-04-22: 10 mg via INTRAVENOUS

## 2017-04-22 MED ORDER — BUPIVACAINE HCL (PF) 0.25 % IJ SOLN
INTRAMUSCULAR | Status: AC
  Filled 2017-04-22: qty 20

## 2017-04-22 MED ORDER — BUPIVACAINE IN DEXTROSE 0.75-8.25 % IT SOLN
INTRATHECAL | Status: AC
  Filled 2017-04-22: qty 2

## 2017-04-22 MED ORDER — WITCH HAZEL-GLYCERIN EX PADS: 1.0000 "application " | MEDICATED_PAD | CUTANEOUS | Status: DC | PRN

## 2017-04-22 MED ORDER — LACTATED RINGERS IV SOLN
INTRAVENOUS | Status: DC | PRN
  Administered 2017-04-22 (×2): via INTRAVENOUS

## 2017-04-22 MED ORDER — SIMETHICONE 80 MG PO CHEW
80.0000 mg | CHEWABLE_TABLET | Freq: Three times a day (TID) | ORAL | Status: DC
  Administered 2017-04-22 – 2017-04-24 (×5): 80 mg via ORAL
  Filled 2017-04-22 (×5): qty 1

## 2017-04-22 MED ORDER — CEFAZOLIN SODIUM-DEXTROSE 2-4 GM/100ML-% IV SOLN
2.0000 g | INTRAVENOUS | Status: AC
  Administered 2017-04-22: 2 g via INTRAVENOUS

## 2017-04-22 MED ORDER — MORPHINE SULFATE (PF) 0.5 MG/ML IJ SOLN
INTRAMUSCULAR | Status: AC
  Filled 2017-04-22: qty 10

## 2017-04-22 MED ORDER — BUPIVACAINE HCL 0.25 % IJ SOLN
INTRAMUSCULAR | Status: DC | PRN
  Administered 2017-04-22: 30 mL

## 2017-04-22 MED ORDER — LACTATED RINGERS IV SOLN
INTRAVENOUS | Status: DC | PRN
  Administered 2017-04-22: 12:00:00 via INTRAVENOUS

## 2017-04-22 SURGICAL SUPPLY — 27 items
APL SKNCLS STERI-STRIP NONHPOA (GAUZE/BANDAGES/DRESSINGS) ×1
BENZOIN TINCTURE PRP APPL 2/3 (GAUZE/BANDAGES/DRESSINGS) ×2 IMPLANT
CHLORAPREP W/TINT 26ML (MISCELLANEOUS) ×2 IMPLANT
CLOTH BEACON ORANGE TIMEOUT ST (SAFETY) ×2 IMPLANT
DRSG OPSITE POSTOP 4X10 (GAUZE/BANDAGES/DRESSINGS) ×2 IMPLANT
ELECT REM PT RETURN 9FT ADLT (ELECTROSURGICAL) ×2
ELECTRODE REM PT RTRN 9FT ADLT (ELECTROSURGICAL) ×1 IMPLANT
GLOVE BIOGEL PI IND STRL 7.0 (GLOVE) ×3 IMPLANT
GLOVE BIOGEL PI INDICATOR 7.0 (GLOVE) ×5
GLOVE ECLIPSE 6.5 STRL STRAW (GLOVE) ×3 IMPLANT
GOWN STRL REUS W/ TWL LRG LVL3 (GOWN DISPOSABLE) ×2 IMPLANT
GOWN STRL REUS W/TWL LRG LVL3 (GOWN DISPOSABLE) ×4
NEEDLE HYPO 22GX1.5 SAFETY (NEEDLE) ×2 IMPLANT
NS IRRIG 1000ML POUR BTL (IV SOLUTION) ×2 IMPLANT
PAD OB MATERNITY 4.3X12.25 (PERSONAL CARE ITEMS) ×2 IMPLANT
PAD PREP 24X48 CUFFED NSTRL (MISCELLANEOUS) ×2 IMPLANT
RETRACTOR WND ALEXIS 25 LRG (MISCELLANEOUS) IMPLANT
RTRCTR WOUND ALEXIS 25CM LRG (MISCELLANEOUS)
STRIP CLOSURE SKIN 1/2X4 (GAUZE/BANDAGES/DRESSINGS) ×2 IMPLANT
SUT PLAIN 2 0 XLH (SUTURE) ×2 IMPLANT
SUT VIC AB 0 CT1 36 (SUTURE) ×4 IMPLANT
SUT VIC AB 2-0 CT1 27 (SUTURE) ×2
SUT VIC AB 2-0 CT1 TAPERPNT 27 (SUTURE) ×1 IMPLANT
SUT VIC AB 4-0 KS 27 (SUTURE) ×2 IMPLANT
SYR CONTROL 10ML LL (SYRINGE) ×2 IMPLANT
TOWEL OR 17X24 6PK STRL BLUE (TOWEL DISPOSABLE) ×6 IMPLANT
TRAY FOLEY CATH SILVER 16FR (SET/KITS/TRAYS/PACK) ×2 IMPLANT

## 2017-04-22 NOTE — Addendum Note (Signed)
Addendum  created 04/22/17 1817 by Graciela HusbandsFussell, Sabria Florido O, CRNA   Sign clinical note

## 2017-04-22 NOTE — Anesthesia Postprocedure Evaluation (Signed)
Anesthesia Post Note  Patient: Gwendolyn Lynch  Procedure(s) Performed: Procedure(s) (LRB): REPEAT CESAREAN SECTION (N/A)     Patient location during evaluation: Mother Baby Anesthesia Type: Spinal Level of consciousness: awake and alert and oriented Pain management: satisfactory to patient Vital Signs Assessment: post-procedure vital signs reviewed and stable Respiratory status: respiratory function stable and spontaneous breathing Cardiovascular status: blood pressure returned to baseline Postop Assessment: no headache, no backache, spinal receding, patient able to bend at knees and adequate PO intake Anesthetic complications: no    Last Vitals:  Vitals:   04/22/17 1615 04/22/17 1720  BP: 106/60 110/65  Pulse: 66 68  Resp: 18 18  Temp:  36.8 C    Last Pain:  Vitals:   04/22/17 1720  TempSrc: Oral  PainSc: 1    Pain Goal: Patients Stated Pain Goal: 3 (04/22/17 1720)               Karleen DolphinFUSSELL,Sosha Shepherd

## 2017-04-22 NOTE — H&P (Signed)
Obstetric Preoperative History and Physical  Gwendolyn Lynch is a 31 y.o. [id number] with IUP at 7817w0d presenting for scheduled cesarean section.  No acute concerns.   Prenatal Course [facility]   Pregnancy complications or risks: Patient Active Problem List   Diagnosis Date Noted  . Thalassemia carrier 12/07/2016  . Anemia affecting pregnancy, antepartum 12/07/2016  . GBS bacteriuria 12/03/2016  . Supervision of low-risk pregnancy 11/25/2016  . Previous cesarean section complicating pregnancy 11/25/2016   She plans to breastfeed Postpartum contraception: undecided  Prenatal labs and studies: ABO, Rh: --/--/O POS (07/31 1025) Antibody: NEG (07/31 1025) Rubella: 3.24 (03/06 0902) RPR: Non Reactive (07/31 1025)  HBsAg: Negative (03/06 0902)  HIV:   non-reactive  JWJ:XBJYNWGNGBS:Positive (07/06 1154) 2-hr Glucola  76, 132, 71 Genetic screening normal Anatomy US normal  Prenatal Transfer Tool  Maternal Diabetes: No Genetic Screening: Normal Maternal Ultrasounds/Referrals: Normal Fetal Ultrasounds or other Referrals:  None Maternal Substance Abuse:  No Significant Maternal Medications:  None Significant Maternal Lab Results: None  Past Medical History:  Diagnosis Date  . Hemorrhoids   . Medical history non-contributory     Past Surgical History:  Procedure Laterality Date  . CESAREAN SECTION     C/S x 3  . CESAREAN SECTION N/A 11/01/2015   Procedure: CESAREAN SECTION;  Surgeon: Jaymes GraffNaima Dillard, MD;  Location: [facility];  Service: Obstetrics;  Laterality: N/A;    OB History  Gravida Para Term Preterm AB Living  5 4 4  0 0 4  SAB TAB Ectopic Multiple Live Births  0 0 0 0 4    # Outcome Date GA Lbr Len/2nd Weight Sex Delivery Anes PTL Lv  5 Current           4 Term 11/01/15 7132w4d  7 lb 11.8 oz (3.51 kg) M CS-LTranv Spinal  LIV  3 Term 2012 3617w0d  6 lb (2.722 kg) M CS-LTranv Spinal N LIV  2 Term 2009 2536w0d  5 lb 11 oz (2.58 kg) M CS-LTranv EPI N LIV  1 Term 2005 9036w0d  5 lb 8  oz (2.495 kg) M Vag-Spont EPI  LIV      Social History   Social History  . Marital status: Single    Spouse name: N/A  . Number of children: N/A  . Years of education: N/A   Social History Main Topics  . Smoking status: Never Smoker  . Smokeless tobacco: Never Used  . Alcohol use No  . Drug use: No  . Sexual activity: Yes    Birth control/ protection: None   Other Topics Concern  . None   Social History Narrative  . None    Family History  Problem Relation Age of Onset  . Diabetes Mother   . Hypertension Mother   . Diabetes Father   . Hypertension Father     Prescriptions Prior to Admission  Medication Sig Dispense Refill Last Dose  . ferrous sulfate (FERROUSUL) 325 (65 FE) MG tablet Take 1 tablet (325 mg total) by mouth 2 (two) times daily. 60 tablet 1 04/21/2017 at Unknown time  . Prenatal Vit-Fe Fumarate-FA (PRENATAL MULTIVITAMIN) TABS tablet Take 1 tablet by mouth daily at 12 noon.   04/21/2017 at Unknown time    No Known Allergies  Review of Systems: Negative except for what is mentioned in HPI.  Physical Exam: BP 104/70   Pulse 81   Temp 98.4 F (36.9 C) (Oral)   Ht 4\' 10"  (1.473 m)   Wt  161 lb (73 kg)   LMP 07/18/2016   BMI 33.65 kg/m  FHR by Doppler: 150 bpm CONSTITUTIONAL: Well-developed, well-nourished female in no acute distress.  HENT:  Normocephalic, atraumatic, External right and left ear normal. Oropharynx is clear and moist EYES: Conjunctivae and EOM are normal. Pupils are equal, round, and reactive to light. No scleral icterus.  NECK: Normal range of motion, supple, no masses SKIN: Skin is warm and dry. No rash noted. Not diaphoretic. No erythema. No pallor. NEUROLGIC: Alert and oriented to person, place, and time. Normal reflexes, muscle tone coordination. No cranial nerve deficit noted. PSYCHIATRIC: Normal mood and affect. Normal behavior. Normal judgment and thought content. CARDIOVASCULAR: Normal heart rate noted, regular  rhythm RESPIRATORY: Effort and breath sounds normal, no problems with respiration noted ABDOMEN: Soft, nontender, nondistended, gravid. PELVIC: Deferred MUSCULOSKELETAL: Normal range of motion. No edema and no tenderness.    Pertinent Labs/Studies:   Results for orders placed or performed during the hospital encounter of 04/21/17 (from the past 72 hour(s))  CBC     Status: Abnormal   Collection Time: 04/21/17 10:25 AM  Result Value Ref Range   WBC 8.5 4.0 - 10.5 K/uL   RBC 3.84 (L) 3.87 - 5.11 MIL/uL   Hemoglobin 9.6 (L) 12.0 - 15.0 g/dL   HCT 16.129.3 (L) 09.636.0 - 04.546.0 %   MCV 76.3 (L) 78.0 - 100.0 fL   MCH 25.0 (L) 26.0 - 34.0 pg   MCHC 32.8 30.0 - 36.0 g/dL   RDW 40.917.0 (H) 81.111.5 - 91.415.5 %   Platelets 277 150 - 400 K/uL  RPR     Status: None   Collection Time: 04/21/17 10:25 AM  Result Value Ref Range   RPR Ser Ql Non Reactive Non Reactive    Comment: (NOTE) Performed At: [facility] [address] [place], [place] [id number] Mila HomerHancock William F MD YQ:[id number]:[phone]   Type and screen [facility]     Status: None   Collection Time: 04/21/17 10:25 AM  Result Value Ref Range   ABO/RH(D) O POS    Antibody Screen NEG    Sample Expiration 04/24/2017     Assessment and Plan :Gwendolyn Lynch is a 31 y.o. [id number] at 8041w0d being admitted for scheduled repeat cesarean section. The risks of cesarean section discussed with the patient included but were not limited to: bleeding which may require transfusion or reoperation; infection which may require antibiotics; injury to bowel, bladder, ureters or other surrounding organs; injury to the fetus; need for additional procedures including hysterectomy in the event of a life-threatening hemorrhage; placental abnormalities wth subsequent pregnancies, incisional problems, thromboembolic phenomenon and other postoperative/anesthesia complications. The patient concurred with the proposed plan, giving informed written consent for the  procedure. Patient has been NPO since last night she will remain NPO for procedure. Anesthesia and OR aware. Preoperative prophylactic antibiotics and SCDs ordered on call to the OR. To OR when ready.    Raynelle FanningJulie P. Mariska Daffin, MD OB Fellow Faculty Practice, [facility]

## 2017-04-22 NOTE — Op Note (Signed)
Cesarean Section Operative Report  Gwendolyn Lynch  PROCEDURE DATE: 04/22/2017  PREOPERATIVE DIAGNOSES: Intrauterine pregnancy at 7165w0d weeks gestation; previous cesarean-section (x3),  POSTOPERATIVE DIAGNOSES: The same; Moderate adhesions; Uterine window  PROCEDURE: Repeat Low Transverse Cesarean Section  SURGEON:   Surgeon(s) and Role:    * Federico FlakeNewton, Kimberly Niles, MD - Primary    * Nolene EbbsJulie Degele, MD - OB Fellow  ASSISTANT:  Nolene EbbsJulie Degele, MD - OB Fellow   INDICATIONS: Gwendolyn Lynch is a 31 y.o. [id number] at 6265w0d here for cesarean section secondary to the indications listed under preoperative diagnoses; please see preoperative note for further details.  The risks of cesarean section were discussed with the patient including but were not limited to: bleeding which may require transfusion or reoperation; infection which may require antibiotics; injury to bowel, bladder, ureters or other surrounding organs; injury to the fetus; need for additional procedures including hysterectomy in the event of a life-threatening hemorrhage; placental abnormalities wth subsequent pregnancies, incisional problems, thromboembolic phenomenon and other postoperative/anesthesia complications.   The patient concurred with the proposed plan, giving informed written consent for the procedure.    FINDINGS:  Viable female infant in cephalic presentation.  Apgars 8 and 9.  Clear amniotic fluid.  Intact placenta, three vessel cord.  Uterine window  ANESTHESIA: Spinal INTRAVENOUS FLUIDS: 2400 ml ESTIMATED BLOOD LOSS: 875 ml URINE OUTPUT:  150 ml SPECIMENS: Placenta sent to L&D COMPLICATIONS: None immediate  PROCEDURE IN DETAIL:  The patient preoperatively received intravenous antibiotics and had sequential compression devices applied to her lower extremities.  She was then taken to the operating room where spinal anesthesia was administered and was found to be adequate. She was then placed in a dorsal supine position  with a leftward tilt, and prepped and draped in a sterile manner.  A foley catheter was placed into her bladder and attached to constant gravity.    After an adequate timeout was performed, a Pfannenstiel skin incision was made with scalpel over her preexisting scar and carried through to the underlying layer of fascia. The fascia was incised in the midline, and this incision was extended bilaterally using the Mayo scissors.  Kocher clamps were applied to the superior aspect of the fascial incision and the underlying rectus muscles were dissected off bluntly.  A similar process was carried out on the inferior aspect of the fascial incision. The rectus muscles were separated in the midline bluntly and the peritoneum was entered bluntly. Attention was turned to the lower uterine segment where a uterine window was noted, a low transverse hysterotomy was made with a scalpel and extended bilaterally bluntly.  The infant was successfully delivered cephalic, the cord was clamped and cut after one minute, and the infant was handed over to the awaiting neonatology team. Cord blood was drawn, then uterine massage was then administered, and the placenta delivered intact with a three-vessel cord. The uterus was then cleared of clots and debris.  The hysterotomy was closed with 0 Vicryl in a running locked fashion. Hemostasis was confirmed on all surfaces.  The peritoneum was not closed due to adhesions. The fascia was then closed using 0 Vicryl in a running fashion. The skin was closed with a 4-0 Vicryl subcuticular stitch on a Keith needle, and 30 ml of 0.5% Marcaine was injected subcutaneously around the incision. The patient tolerated the procedure well. Sponge, lap, instrument and needle counts were correct x 3.  She was taken to the recovery room in stable condition.  Disposition: PACU - hemodynamically stable.   Maternal Condition: stable    Signed: Frederik PearJulie P Degele, MD OB Fellow 04/22/2017 12:27 PM

## 2017-04-22 NOTE — Anesthesia Procedure Notes (Cosign Needed)
Spinal  Start time: 04/22/2017 11:02 AM End time: 04/22/2017 11:03 AM Staffing Anesthesiologist: ODDONO, ERNEST Performed: other anesthesia staff  Preanesthetic Checklist Completed: patient identified, site marked, surgical consent, pre-op evaluation, timeout performed, IV checked, risks and benefits discussed and monitors and equipment checked Spinal Block Patient position: sitting Prep: DuraPrep Patient monitoring: heart rate, continuous pulse ox and blood pressure Approach: midline Location: L2-3 Injection technique: single-shot Needle Needle type: Pencan  Needle gauge: 24 G Needle length: 10 cm Assessment Sensory level: T4

## 2017-04-22 NOTE — Anesthesia Postprocedure Evaluation (Signed)
Anesthesia Post Note  Patient: Colton N Clouatre  Procedure(s) Performed: Procedure(s) (LRB): REPEAT CESAREAN SECTION (N/A)     Patient location during evaluation: PACU Anesthesia Type: Spinal Level of consciousness: oriented and awake and alert Pain management: pain level controlled Vital Signs Assessment: post-procedure vital signs reviewed and stable Respiratory status: spontaneous breathing, respiratory function stable and patient connected to nasal cannula oxygen Cardiovascular status: blood pressure returned to baseline and stable Postop Assessment: no headache and no backache Anesthetic complications: no    Last Vitals:  Vitals:   04/22/17 1240 04/22/17 1245  BP:  97/64  Pulse: 91 71  Resp: 13 15  Temp:      Last Pain:  Vitals:   04/22/17 1245  TempSrc:   PainSc: 0-No pain   Pain Goal:                 Daron Stutz

## 2017-04-22 NOTE — Transfer of Care (Cosign Needed)
Immediate Anesthesia Transfer of Care Note  Patient: Gwendolyn Lynch  Procedure(s) Performed: Procedure(s): REPEAT CESAREAN SECTION (N/A)  Patient Location: PACU  Anesthesia Type:Spinal  Level of Consciousness: awake, alert  and oriented  Airway & Oxygen Therapy: Patient Spontanous Breathing  Post-op Assessment: Report given to RN and Post -op Vital signs reviewed and stable  Post vital signs: Reviewed and stable  Last Vitals:  Vitals:   04/22/17 0752  BP: 104/70  Pulse: 81  Temp: 36.9 C    Last Pain:  Vitals:   04/22/17 0752  TempSrc: Oral         Complications: No apparent anesthesia complications

## 2017-04-23 ENCOUNTER — Encounter (HOSPITAL_COMMUNITY): Payer: Self-pay | Admitting: *Deleted

## 2017-04-23 LAB — CBC
HEMATOCRIT: 24.5 % — AB (ref 36.0–46.0)
Hemoglobin: 7.9 g/dL — ABNORMAL LOW (ref 12.0–15.0)
MCH: 24.4 pg — AB (ref 26.0–34.0)
MCHC: 32.2 g/dL (ref 30.0–36.0)
MCV: 75.6 fL — AB (ref 78.0–100.0)
PLATELETS: 250 10*3/uL (ref 150–400)
RBC: 3.24 MIL/uL — AB (ref 3.87–5.11)
RDW: 16.9 % — ABNORMAL HIGH (ref 11.5–15.5)
WBC: 13.6 10*3/uL — AB (ref 4.0–10.5)

## 2017-04-23 LAB — BIRTH TISSUE RECOVERY COLLECTION (PLACENTA DONATION)

## 2017-04-23 NOTE — Plan of Care (Signed)
Problem: Bowel/Gastric: Goal: Gastrointestinal status will improve Outcome: Progressing Passing flatus and ambulating to help with bowel function

## 2017-04-23 NOTE — Progress Notes (Signed)
POSTPARTUM PROGRESS NOTE  Post op 1 Subjective:  Gwendolyn Lynch is a 31 y.o. [id number] 6662w0d s/p cesarean.  No acute events overnight.  Pt denies problems with ambulating, voiding or po intake.  She denies nausea or vomiting.  Pain is well controlled.  She has not had flatus. She has not had bowel movement.  Lochia Minimal.   Objective: Blood pressure 105/69, pulse 61, temperature 97.9 F (36.6 C), temperature source Oral, resp. rate 18, height 4\' 10"  (1.473 m), weight 73 kg (161 lb), last menstrual period 07/18/2016, SpO2 98 %, unknown if currently breastfeeding.  Physical Exam:  General: alert, cooperative and no distress Lochia:normal flow Chest: CTAB Heart: RRR no m/r/g Abdomen: +BS, soft, nontender,  Uterine Fundus: firm DVT Evaluation: No calf swelling or tenderness Extremities: no edema   Recent Labs  04/21/17 1025 04/23/17 0507  HGB 9.6* 7.9*  HCT 29.3* 24.5*    Assessment/Plan:  ASSESSMENT: Gwendolyn Lynch is a 31 y.o. [id number] 862w0d s/p cesarean  Plan for discharge tomorrow   LOS: 1 day   Sullivan LoneBrannon L Kedron Uno, Medical Student

## 2017-04-23 NOTE — Lactation Note (Signed)
This note was copied from a baby's chart. Lactation Consultation Note Mom states she BF her last of the 4 children. Corliss ParishYoungest is 3517 months old. Mom was BF when LC entered rm. BF in cradle position wrapped tight in blanket. Encouraged STS. Mom stated baby doesn't like the Lt. Breast. Mom has pendulum breast w/everted nipples compressible. Discussed BF options and positioning. Assisted baby to Lt. Breast. Latched well. Mom kept pulling breast back. Encouraged breast holding options, and good baby body alignment. Mom cont. To BF her way. Made suggestions, mom didn't seem to want to try them.  Mom wearing glasses that are broken w/one side piece holding glasses on over ear. Mom doesn't have WIC.  Mom encouraged to feed baby 8-12 times/24 hours and with feeding cues.  Reviewed newborn feeding habits, STS, I&O, cluster feeding, supply and demand. Encouraged mom to call for assistance or questions.  [facility] brochure given w/resources, support groups and LC services. Patient Name: Gwendolyn Lynch GEXBM'WToday's Date: 04/23/2017 Reason for consult: Initial assessment   Maternal Data Has patient been taught Hand Expression?: Yes Does the patient have breastfeeding experience prior to this delivery?: Yes  Feeding Feeding Type: Breast Fed Length of feed: 15 min  LATCH Score Latch: Repeated attempts needed to sustain latch, nipple held in mouth throughout feeding, stimulation needed to elicit sucking reflex.  Audible Swallowing: A few with stimulation  Type of Nipple: Everted at rest and after stimulation  Comfort (Breast/Nipple): Soft / non-tender  Hold (Positioning): Assistance needed to correctly position infant at breast and maintain latch.  LATCH Score: 7  Interventions Interventions: Breast feeding basics reviewed;Assisted with latch;Breast massage;Hand express;Adjust position;Support pillows;Position options  Lactation Tools Discussed/Used WIC Program: No   Consult Status Consult Status:  Follow-up Date: 04/23/17 (in pm) Follow-up type: In-patient    [name], Diamond NickelLAURA G 04/23/2017, 1:17 AM

## 2017-04-23 NOTE — Progress Notes (Signed)
I have seen this patient and agree. 

## 2017-04-24 ENCOUNTER — Encounter: Payer: Medicaid Other | Admitting: Obstetrics and Gynecology

## 2017-04-24 MED ORDER — OXYCODONE HCL 5 MG PO TABS
5.0000 mg | ORAL_TABLET | ORAL | 0 refills | Status: DC | PRN
Start: 2017-04-24 — End: 2017-06-25

## 2017-04-24 MED ORDER — IBUPROFEN 600 MG PO TABS: 600.0000 mg | ORAL_TABLET | Freq: Four times a day (QID) | ORAL | 0 refills | Status: DC | PRN

## 2017-04-24 NOTE — Discharge Summary (Signed)
OB Discharge Summary     Patient Name: Gwendolyn Lynch DOB: Jan 14, 1986 MRN: [id number]  Date of admission: 04/22/2017 Delivering MD: Gwendolyn Lynch   Date of discharge: 04/24/2017  Admitting diagnosis: RCS Intrauterine pregnancy: 6033w0d     Secondary diagnosis:  Active Problems:   Supervision of low-risk pregnancy   Previous cesarean section complicating pregnancy   GBS bacteriuria   Anemia affecting pregnancy, antepartum   Status post repeat low transverse cesarean section  Additional problems: none     Discharge diagnosis: Term Pregnancy Delivered and Anemia                                                                                                Post partum procedures:none  Augmentation: N/A  Complications: None  Hospital course:  Sceduled C/S   [name] at 9033w0d was admitted to the hospital 04/22/2017 for scheduled cesarean section with the following indication:Elective Repeat.  Membrane Rupture Time/Date: 11:31 AM ,04/22/2017   Patient delivered a Viable infant.04/22/2017  Details of operation can be found in separate operative note.  Pateint had an uncomplicated postpartum course.  She is ambulating, tolerating a regular diet, passing flatus, and urinating well. Patient is discharged home in stable condition on  04/24/17. She denies dizziness with ambulation and is feeling well.         Physical exam  Vitals:   04/23/17 1000 04/23/17 1418 04/23/17 1809 04/24/17 0528  BP: (!) 96/54 (!) 102/50 107/65 (!) 112/58  Pulse: 64 72 67 76  Resp: 18 18 18 18   Temp: 98.2 F (36.8 C) 98.4 F (36.9 C) 98.3 F (36.8 C) 97.7 F (36.5 C)  TempSrc:   Axillary Oral  SpO2:      Weight:      Height:       General: alert and cooperative Lochia: appropriate Uterine Fundus: firm Incision: honeycomb intact, unchanged DVT Evaluation: No evidence of DVT seen on physical exam. Labs: Lab Results  Component Value Date   WBC 13.6 (H) 04/23/2017   HGB 7.9 (L)  04/23/2017   HCT 24.5 (L) 04/23/2017   MCV 75.6 (L) 04/23/2017   PLT 250 04/23/2017   CMP 01/18/2008  Glucose 81  BUN 5(L)  Creatinine 0.73  Sodium 135  Potassium 3.7  Chloride 102  CO2 24  Calcium 9.6  Total Protein 7.4  Total Bilirubin 0.6  Alkaline Phos 55  AST 21  ALT 12    Discharge instruction: per After Visit Summary and "Baby and Me Booklet".  After visit meds:  Allergies as of 04/24/2017   No Known Allergies     Medication List    TAKE these medications   ferrous sulfate 325 (65 FE) MG tablet Commonly known as:  FERROUSUL Take 1 tablet (325 mg total) by mouth 2 (two) times daily.   ibuprofen 600 MG tablet Commonly known as:  ADVIL,MOTRIN Take 1 tablet (600 mg total) by mouth every 6 (six) hours as needed.   oxyCODONE 5 MG immediate release tablet Commonly known as:  Oxy IR/ROXICODONE Take 1 tablet (5 mg total) by mouth  every 4 (four) hours as needed (pain scale 4-7).   prenatal multivitamin Tabs tablet Take 1 tablet by mouth daily at 12 noon.       Diet: routine diet  Activity: Advance as tolerated. Pelvic rest for 6 weeks.   Outpatient follow up:4 weeks Follow up Appt:Future Appointments Date Time Provider Department Center  05/20/2017 9:40 AM Donette LarryBhambri, Melanie, CNM [facility]   Follow up Visit:No Follow-up on file.  Postpartum contraception: LARC  Newborn Data: Live born female  Birth Weight: 7 lb 15.2 oz (3605 g) APGAR: 8, 9  Baby Feeding: Breast Disposition:home with mother   04/24/2017 Gwendolyn Lynch, CNM  10:00 AM

## 2017-04-24 NOTE — Lactation Note (Signed)
This note was copied from a baby's chart. Lactation Consultation Note  Patient Name: Gwendolyn Lynch ZOXWR'UToday's Date: 04/24/2017 Reason for consult: Follow-up assessment   With this mom of a term baby, now 848 hours old. This is mom's 5th time breast feeding. She reports cracked nipples, and showed me that baby was latching shallow. Mom had not been supporting her back, and was placing her nipple in his mouth. Mom sat in a chair, with good back and leg support, used football hold. The baby latched deeply, but did need his lower lip flanged. Mom reports this latch feeling much better. Mom's youngest at home is 17months old. I reminded mom that this baby is a newborn, and latching is different. Mom very receptive to teaching, smiling when I left her breast feeding her baby. Mom knows to cal for questions/concerns   Maternal Data    Feeding Feeding Type: Breast Fed Length of feed:  (latched at 1145 - still feeding when I left the room)  LATCH Score Latch: Grasps breast easily, tongue down, lips flanged, rhythmical sucking. (needed bottom lip flanged by LC, showed mom asymetrical latch)  Audible Swallowing: Spontaneous and intermittent  Type of Nipple: Everted at rest and after stimulation  Comfort (Breast/Nipple): Soft / non-tender  Hold (Positioning): Assistance needed to correctly position infant at breast and maintain latch.  LATCH Score: 9  Interventions Interventions: Breast feeding basics reviewed;Assisted with latch;Support pillows;Position options  Lactation Tools Discussed/Used     Consult Status Consult Status: Complete Follow-up type: Call as needed    Alfred LevinsLee, Artavia Jeanlouis Anne 04/24/2017, 11:49 AM

## 2017-04-24 NOTE — Discharge Instructions (Signed)

## 2017-05-20 ENCOUNTER — Encounter: Payer: Self-pay | Admitting: Certified Nurse Midwife

## 2017-05-20 ENCOUNTER — Ambulatory Visit (INDEPENDENT_AMBULATORY_CARE_PROVIDER_SITE_OTHER): Payer: Medicaid Other | Admitting: Certified Nurse Midwife

## 2017-05-20 DIAGNOSIS — Z3009 Encounter for other general counseling and advice on contraception: Secondary | ICD-10-CM | POA: Diagnosis not present

## 2017-05-20 DIAGNOSIS — Z3689 Encounter for other specified antenatal screening: Secondary | ICD-10-CM

## 2017-05-20 DIAGNOSIS — O99019 Anemia complicating pregnancy, unspecified trimester: Secondary | ICD-10-CM | POA: Insufficient documentation

## 2017-05-20 NOTE — Progress Notes (Signed)
Subjective:     Gwendolyn Lynch is a 31 y.o. female who presents for a postpartum visit. She is 4 weeks postpartum following a cesarean delivery. I have fully reviewed the prenatal and intrapartum course. The delivery was at 39 gestational weeks. Outcome: repeat cesarean section, low transverse incision. Anesthesia: spinal. Postpartum course has been uncomplicated. Baby's course has been uncomplicated. Baby is feeding by breast. Bleeding no bleeding. Bowel function is normal. Bladder function is normal. Patient is not sexually active. Contraception method is abstinence. Postpartum depression screening: negative.  The following portions of the patient's history were reviewed and updated as appropriate: allergies, current medications, past family history, past medical history, past social history, past surgical history and problem list.  Review of Systems Pertinent items are noted in HPI.   Objective:    BP 135/88   Pulse 63   Ht 4\' 10"  (1.473 m)   Wt 148 lb 8 oz (67.4 kg)   Breastfeeding? Yes   BMI 31.04 kg/m   General:  alert, cooperative and no distress   Breasts:    Lungs: nml rate and effort  Heart:  nml rate  Abdomen: soft, nontender, low transverse incision well healed   Vulva:    Vagina:   Cervix:    Corpus:   Adnexa:    Rectal Exam:        Neuro: grossly normal Assessment:      Normal postpartum exam. Pap smear not done at today's visit.  Contraceptive counseling- discussed options, written info on IUD provided Plan:      1. Contraception: abstinence 2. Call for appt if desires IUD placement 3. Follow up in: 1 year or as needed.

## 2017-06-25 ENCOUNTER — Inpatient Hospital Stay (HOSPITAL_COMMUNITY)
Admission: AD | Admit: 2017-06-25 | Discharge: 2017-06-25 | Disposition: A | Discharge status: Home / Self Care | Payer: Medicaid Other | Source: Ambulatory Visit | Attending: Obstetrics and Gynecology | Admitting: Obstetrics and Gynecology

## 2017-06-25 ENCOUNTER — Encounter (HOSPITAL_COMMUNITY): Payer: Self-pay | Admitting: *Deleted

## 2017-06-25 DIAGNOSIS — Z3202 Encounter for pregnancy test, result negative: Secondary | ICD-10-CM | POA: Diagnosis not present

## 2017-06-25 DIAGNOSIS — R300 Dysuria: Secondary | ICD-10-CM | POA: Diagnosis present

## 2017-06-25 DIAGNOSIS — N3001 Acute cystitis with hematuria: Secondary | ICD-10-CM

## 2017-06-25 LAB — URINALYSIS, ROUTINE W REFLEX MICROSCOPIC
BILIRUBIN URINE: NEGATIVE
Glucose, UA: NEGATIVE mg/dL
Ketones, ur: NEGATIVE mg/dL
LEUKOCYTES UA: NEGATIVE
NITRITE: NEGATIVE
PH: 5 (ref 5.0–8.0)
Protein, ur: NEGATIVE mg/dL
SPECIFIC GRAVITY, URINE: 1.014 (ref 1.005–1.030)

## 2017-06-25 LAB — POCT PREGNANCY, URINE: PREG TEST UR: NEGATIVE

## 2017-06-25 MED ORDER — CEPHALEXIN 500 MG PO CAPS: 500.0000 mg | ORAL_CAPSULE | Freq: Two times a day (BID) | ORAL | 0 refills | Status: DC

## 2017-06-25 NOTE — Discharge Instructions (Signed)

## 2017-06-25 NOTE — MAU Provider Note (Signed)
History     CSN: [id number]  Arrival date and time: 06/25/17 9147  First Provider Initiated Contact with Patient 06/25/17 2125      Chief Complaint  Patient presents with  . Dysuria   HPI Gwendolyn Lynch is a 31 y.o. female who presents with dysuria. Symptoms began yesterday. Reports burning with urination & increased urinary frequency. Denies fever/chills, flank pain, n/v, or bleeding. Hx of UTI & states feels the same. Rates pain 5/10. Has tried drinking cranberry juice to treat symptoms without relief.   Past Medical History:  Diagnosis Date  . Hemorrhoids   . Medical history non-contributory     Past Surgical History:  Procedure Laterality Date  . CESAREAN SECTION     C/S x 3  . CESAREAN SECTION N/A 11/01/2015   Procedure: CESAREAN SECTION;  Surgeon: Jaymes Graff, MD;  Location: [facility];  Service: Obstetrics;  Laterality: N/A;  . CESAREAN SECTION N/A 04/22/2017   Procedure: REPEAT CESAREAN SECTION;  Surgeon: Federico Flake, MD;  Location[facility]TH[facility] SUIT[facility];  Service: Obstetrics;  Laterality: N/A;    Family History  Problem Relation Age of Onset  . Diabetes Mother   . Hypertension Mother   . Diabetes Father   . Hypertension Father     Social History  Substance Use Topics  . Smoking status: Never Smoker  . Smokeless tobacco: Never Used  . Alcohol use No    Allergies: No Known Allergies  Prescriptions Prior to Admission  Medication Sig Dispense Refill Last Dose  . ferrous sulfate (FERROUSUL) 325 (65 FE) MG tablet Take 1 tablet (325 mg total) by mouth 2 (two) times daily. 60 tablet 1 More than a month at Unknown time  . ibuprofen (ADVIL,MOTRIN) 600 MG tablet Take 1 tablet (600 mg total) by mouth every 6 (six) hours as needed. 30 tablet 0 More than a month at Unknown time  . oxyCODONE (OXY IR/ROXICODONE) 5 MG immediate release tablet Take 1 tablet (5 mg total) by mouth every 4 (four) hours as needed (pain scale 4-7). 50 tablet 0 More than a month at Unknown  time  . Prenatal Vit-Fe Fumarate-FA (PRENATAL MULTIVITAMIN) TABS tablet Take 1 tablet by mouth daily at 12 noon.   More than a month at Unknown time    Review of Systems  Constitutional: Negative.   Gastrointestinal: Negative.   Genitourinary: Positive for dysuria and frequency. Negative for difficulty urinating, flank pain, hematuria, vaginal bleeding and vaginal discharge.  Musculoskeletal: Negative for back pain.   Physical Exam   Blood pressure 138/86, pulse 62, temperature 98 F (36.7 C), temperature source Oral, resp. rate 16, height  (1.473 m), weight 148 lb (67.1 kg), SpO2 100 %, currently breastfeeding.  Physical Exam  Nursing note and vitals reviewed. Constitutional: She is oriented to person, place, and time. She appears well-developed and well-nourished. No distress.  HENT:  Head: Normocephalic and atraumatic.  Eyes: Conjunctivae are normal. Right eye exhibits no discharge. Left eye exhibits no discharge. No scleral icterus.  Neck: Normal range of motion.  Respiratory: Effort normal. No respiratory distress.  GI: Soft. There is no tenderness. There is no CVA tenderness.  Neurological: She is alert and oriented to person, place, and time.  Skin: Skin is warm and dry. She is not diaphoretic.  Psychiatric: She has a normal mood and affect. Her behavior is normal. Judgment and thought content normal.    MAU Course  Procedures Results for orders placed or performed during the hospital encounter of 06/25/17 (  from the past 24 hour(s))  Urinalysis, Routine w reflex microscopic     Status: Abnormal   Collection Time: 06/25/17  8:20 PM  Result Value Ref Range   Color, Urine STRAW (A) YELLOW   APPearance CLEAR CLEAR   Specific Gravity, Urine 1.014 1.005 - 1.030   pH 5.0 5.0 - 8.0   Glucose, UA NEGATIVE NEGATIVE mg/dL   Hgb urine dipstick MODERATE (A) NEGATIVE   Bilirubin Urine NEGATIVE NEGATIVE   Ketones, ur NEGATIVE NEGATIVE mg/dL   Protein, ur NEGATIVE NEGATIVE  mg/dL   Nitrite NEGATIVE NEGATIVE   Leukocytes, UA NEGATIVE NEGATIVE   RBC / HPF 0-5 0 - 5 RBC/hpf   WBC, UA 0-5 0 - 5 WBC/hpf   Bacteria, UA RARE (A) NONE SEEN   Squamous Epithelial / LPF 0-5 (A) NONE SEEN   Mucus PRESENT   Pregnancy, urine POC     Status: None   Collection Time: 06/25/17  8:55 PM  Result Value Ref Range   Preg Test, Ur NEGATIVE NEGATIVE    MDM UPT negative VSS, NAD No CVAT  Assessment and Plan  A; 1. Acute cystitis with hematuria   2. Pregnancy examination or test, negative result    P; Discharge home Rx keflex F/u with PCP or urgent care Increase water intake  Judeth Horn 06/25/2017, 9:24 PM

## 2017-06-25 NOTE — MAU Note (Signed)
Pt reports burning and pain with urination and blood in urine. States she first noticed symptoms yesterday. Tried home remedies and drinking cranberry juice. Pt denies fever, chills, abdominal/back pain, or N/V. Pt delivered via c/s in August-has not had a period since delivery.

## 2019-11-30 ENCOUNTER — Other Ambulatory Visit: Payer: Self-pay

## 2019-11-30 ENCOUNTER — Ambulatory Visit (HOSPITAL_COMMUNITY)
Admission: EM | Admit: 2019-11-30 | Discharge: 2019-11-30 | Disposition: A | Discharge status: Home / Self Care | Payer: Medicaid Other | Attending: Family Medicine | Admitting: Family Medicine

## 2019-11-30 ENCOUNTER — Encounter (HOSPITAL_COMMUNITY): Payer: Self-pay

## 2019-11-30 DIAGNOSIS — R112 Nausea with vomiting, unspecified: Secondary | ICD-10-CM | POA: Diagnosis present

## 2019-11-30 DIAGNOSIS — Z20822 Contact with and (suspected) exposure to covid-19: Secondary | ICD-10-CM | POA: Diagnosis not present

## 2019-11-30 DIAGNOSIS — K529 Noninfective gastroenteritis and colitis, unspecified: Secondary | ICD-10-CM | POA: Diagnosis not present

## 2019-11-30 MED ORDER — ONDANSETRON HCL 4 MG PO TABS: 4.0000 mg | ORAL_TABLET | Freq: Four times a day (QID) | ORAL | 0 refills | Status: DC

## 2019-11-30 MED ORDER — ONDANSETRON 4 MG PO TBDP
ORAL_TABLET | ORAL | Status: AC
  Filled 2019-11-30: qty 1

## 2019-11-30 MED ORDER — ONDANSETRON 4 MG PO TBDP
4.0000 mg | ORAL_TABLET | Freq: Once | ORAL | Status: AC
  Administered 2019-11-30: 16:00:00 4 mg via ORAL

## 2019-11-30 NOTE — Discharge Instructions (Signed)
You have gastroenteritis. I have given you some zofran in the office. I have also sent in Zofran to your pharmacy.  Go to the ER for shortness of breath, high fever, severe diarrhea, or other concerning symptoms.

## 2019-11-30 NOTE — ED Triage Notes (Signed)
Pt states she's light headed and she has been vomiting. X 3 days.

## 2019-11-30 NOTE — ED Provider Notes (Signed)
[facility]    CSN: [id number] Arrival date & time: 11/30/19  1426      History   Chief Complaint Chief Complaint  Patient presents with  . Emesis    HPI Gwendolyn Lynch is a 34 y.o. female.   Patient reports nausea and vomiting for the last 3 days.  Reports that she is not really been able to keep any food down, but she has been to keep some liquids.  Reports that she has made no attempts to treat at home.  Reports that she is still voiding as usual.  Denies anyone else has a GI bug around her.  Denies fever, headaches, diarrhea, shortness of breath, chills, body aches, rash, other symptoms.  ROS Per HPI  The history is provided by the patient.    Past Medical History:  Diagnosis Date  . Hemorrhoids     Patient Active Problem List   Diagnosis Date Noted  . 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 CESAREAN SECTION     C/S x 3  . CESAREAN SECTION N/A 11/01/2015   Procedure: CESAREAN SECTION;  Surgeon: Jaymes Graff, MD;  Location: [facility];  Service: Obstetrics;  Laterality: N/A;  . CESAREAN SECTION N/A 04/22/2017   Procedure: REPEAT CESAREAN SECTION;  Surgeon: Federico Flake, MD;  Location: [facility] BIRTHING SUITES;  Service: Obstetrics;  Laterality: N/A;    OB History    Gravida  5   Para  5   Term  5   Preterm  0   AB  0   Living  5     SAB  0   TAB  0   Ectopic  0   Multiple  0   Live Births  5            Home Medications    Prior to Admission medications   Medication Sig Start Date End Date Taking? Authorizing Provider  cephALEXin (KEFLEX) 500 MG capsule Take 1 capsule (500 mg total) by mouth 2 (two) times daily. 06/25/17   Judeth Horn, NP  ondansetron (ZOFRAN) 4 MG tablet Take 1 tablet (4 mg total) by mouth every 6 (six) hours. 11/30/19   Moshe Cipro, NP    Family History Family History  Problem Relation Age of Onset  . Diabetes Mother   . Hypertension Mother     . Diabetes Father   . Hypertension Father     Social History Social History   Tobacco Use  . Smoking status: Never Smoker  . Smokeless tobacco: Never Used  Substance Use Topics  . Alcohol use: No  . Drug use: No     Allergies   Patient has no known allergies.   Review of Systems Review of Systems   Physical Exam Triage Vital Signs ED Triage Vitals  Enc Vitals Group     BP 11/30/19 1546 (!) 152/94     Pulse Rate 11/30/19 1546 74     Resp 11/30/19 1546 16     Temp 11/30/19 1546 99.3 F (37.4 C)     Temp Source 11/30/19 1546 Oral     SpO2 11/30/19 1546 98 %     Weight 11/30/19 1545 166 lb (75.3 kg)     Height --      Head Circumference --      Peak Flow --      Pain Score 11/30/19 1545 5     Pain Loc --  Pain Edu? --      Excl. in [place]? --    No data found.  Updated Vital Signs BP (!) 152/94 (BP Location: Right Arm)   Pulse 74   Temp 99.3 F (37.4 C) (Oral)   Resp 16   Wt 166 lb (75.3 kg)   LMP 11/30/2019   SpO2 98%   BMI 34.69 kg/m       Physical Exam Vitals and nursing note reviewed.  Constitutional:      General: She is not in acute distress.    Appearance: Normal appearance. She is well-developed and normal weight. She is ill-appearing.  HENT:     Head: Normocephalic and atraumatic.     Right Ear: Tympanic membrane normal.     Nose: Nose normal.     Mouth/Throat:     Mouth: Mucous membranes are moist.  Eyes:     Conjunctiva/sclera: Conjunctivae normal.  Cardiovascular:     Rate and Rhythm: Normal rate and regular rhythm.     Heart sounds: Normal heart sounds. No murmur.  Pulmonary:     Effort: Pulmonary effort is normal. No respiratory distress.     Breath sounds: Normal breath sounds. No stridor. No wheezing, rhonchi or rales.  Chest:     Chest wall: No tenderness.  Abdominal:     General: Bowel sounds are normal. There is no distension.     Palpations: Abdomen is soft. There is no mass.     Tenderness: There is no abdominal  tenderness. There is no right CVA tenderness, left CVA tenderness, guarding or rebound.     Hernia: No hernia is present.  Musculoskeletal:        General: Normal range of motion.     Cervical back: Neck supple.  Skin:    General: Skin is warm and dry.     Capillary Refill: Capillary refill takes less than 2 seconds.  Neurological:     General: No focal deficit present.     Mental Status: She is alert and oriented to person, place, and time.  Psychiatric:        Mood and Affect: Mood normal.        Behavior: Behavior normal.      UC Treatments / Results  Labs (all labs ordered are listed, but only abnormal results are displayed) Labs Reviewed  SARS CORONAVIRUS 2 (TAT 6-24 HRS)    EKG   Radiology No results found.  Procedures Procedures (including critical care time)  Medications Ordered in UC Medications  ondansetron (ZOFRAN-ODT) disintegrating tablet 4 mg (4 mg Oral Given 11/30/19 1606)    Initial Impression / Assessment and Plan / UC Course  I have reviewed the triage vital signs and the nursing notes.  Pertinent labs & imaging results that were available during my care of the patient were reviewed by me and considered in my medical decision making (see chart for details).     Nausea and vomiting x3 days.  Zofran given in office today.  Zofran 4 mg every 6 hours as needed for nausea sent to patient's pharmacy.  Instructed to follow-up if not improving over the next day or so.  Instructed to go to the ED for shortness of breath, fever, feeling dehydrated or dizzy, or other concerning symptoms. Final Clinical Impressions(s) / UC Diagnoses   Final diagnoses:  Intractable vomiting with nausea, unspecified vomiting type     Discharge Instructions     You have gastroenteritis. I have given you some zofran in the  office. I have also sent in Zofran to your pharmacy.  Go to the ER for shortness of breath, high fever, severe diarrhea, or other concerning symptoms.      ED Prescriptions    Medication Sig Dispense Auth. Provider   ondansetron (ZOFRAN) 4 MG tablet Take 1 tablet (4 mg total) by mouth every 6 (six) hours. 12 tablet Moshe Cipro, NP     I have reviewed the PDMP during this encounter.   Moshe Cipro, NP 11/30/19 1747

## 2019-12-02 LAB — NOVEL CORONAVIRUS, NAA (HOSP ORDER, SEND-OUT TO REF LAB; TAT 18-24 HRS): SARS-CoV-2, NAA: NOT DETECTED

## 2020-01-02 ENCOUNTER — Ambulatory Visit (INDEPENDENT_AMBULATORY_CARE_PROVIDER_SITE_OTHER): Payer: Medicaid Other | Admitting: Student

## 2020-01-02 ENCOUNTER — Encounter: Payer: Self-pay | Admitting: Student

## 2020-01-02 ENCOUNTER — Other Ambulatory Visit (HOSPITAL_COMMUNITY)
Admission: RE | Admit: 2020-01-02 | Discharge: 2020-01-02 | Disposition: A | Discharge status: Home / Self Care | Payer: Medicaid Other | Source: Ambulatory Visit | Attending: Student | Admitting: Student

## 2020-01-02 ENCOUNTER — Other Ambulatory Visit: Payer: Self-pay

## 2020-01-02 VITALS — BP 114/85 | HR 69 | Ht <= 58 in | Wt 162.9 lb

## 2020-01-02 DIAGNOSIS — Z01419 Encounter for gynecological examination (general) (routine) without abnormal findings: Secondary | ICD-10-CM | POA: Diagnosis not present

## 2020-01-02 DIAGNOSIS — Z113 Encounter for screening for infections with a predominantly sexual mode of transmission: Secondary | ICD-10-CM

## 2020-01-02 DIAGNOSIS — Z0000 Encounter for general adult medical examination without abnormal findings: Secondary | ICD-10-CM | POA: Diagnosis present

## 2020-01-02 NOTE — Patient Instructions (Addendum)
Primary Care Resources:  [facility]  [address] [place] [address] Ph [phone] Every 2nd Saturday 9am-12pm  [email or web address] FREE Services  - [facility]  [address] [place] Ph [phone]  [address] [place] Ph [phone]  [email or web address] $45 per visit/Walk-in only  - [facility]  [address] [place] [address] Ph [phone]  1st & 3rd Saturday of each month 9:30am-12:30pm [email or web address] Sliding fee scale/Call to make an appointment  - [facility]  [address] [place] Ph [phone]  Hours Mon-Fri 9am-7pm & Sat 9am-1pm [email or web address] Visits start at $45 per visit/Call to make an appointment  [facility]  [address] [place] [address] Ph [phone]  Hours Mon-Wed 8:30am-5pm & Thurs 8:30am-8pm $5 per visit/Call for an eligibility appointment    Health Maintenance, Female Adopting a healthy lifestyle and getting preventive care are important in promoting health and wellness. Ask your health care provider about:  The right schedule for you to have regular tests and exams.  Things you can do on your own to prevent diseases and keep yourself healthy. What should I know about diet, weight, and exercise? Eat a healthy diet   Eat a diet that includes plenty of vegetables, fruits, low-fat dairy products, and lean protein.  Do not eat a lot of foods that are high in solid fats, added sugars, or sodium. Maintain a healthy weight Body mass index (BMI) is used to identify weight problems. It estimates body fat based on height and weight. Your health care provider can help determine your BMI and help you achieve or maintain a healthy weight. Get regular exercise Get regular exercise. This is one of the most important things you can do for your health. Most adults should:  Exercise for at  least 150 minutes each week. The exercise should increase your heart rate and make you sweat (moderate-intensity exercise).  Do strengthening exercises at least twice a week. This is in addition to the moderate-intensity exercise.  Spend less time sitting. Even light physical activity can be beneficial. Watch cholesterol and blood lipids Have your blood tested for lipids and cholesterol at 33 years of age, then have this test every 5 years. Have your cholesterol levels checked more often if:  Your lipid or cholesterol levels are high.  You are older than 33 years of age.  You are at high risk for heart disease. What should I know about cancer screening? Depending on your health history and family history, you may need to have cancer screening at various ages. This may include screening for:  Breast cancer.  Cervical cancer.  Colorectal cancer.  Skin cancer.  Lung cancer. What should I know about heart disease, diabetes, and high blood pressure? Blood pressure and heart disease  High blood pressure causes heart disease and increases the risk of stroke. This is more likely to develop in people who have high blood pressure readings, are of African descent, or are overweight.  Have your blood pressure checked: ? Every 3-5 years if you are 4-65 years of age. ? Every year if you are 42 years old or older. Diabetes Have regular diabetes screenings. This checks your fasting blood sugar level. Have the screening done:  Once every three years after age 28 if you are at a normal weight and have a low risk for diabetes.  More often and at a  younger age if you are overweight or have a high risk for diabetes. What should I know about preventing infection? Hepatitis B If you have a higher risk for hepatitis B, you should be screened for this virus. Talk with your health care provider to find out if you are at risk for hepatitis B infection. Hepatitis C Testing is recommended  for:  Everyone born from [birth year] through 1964.  Anyone with known risk factors for hepatitis C. Sexually transmitted infections (STIs)  Get screened for STIs, including gonorrhea and chlamydia, if: ? You are sexually active and are younger than 33 years of age. ? You are older than 33 years of age and your health care provider tells you that you are at risk for this type of infection. ? Your sexual activity has changed since you were last screened, and you are at increased risk for chlamydia or gonorrhea. Ask your health care provider if you are at risk.  Ask your health care provider about whether you are at high risk for HIV. Your health care provider may recommend a prescription medicine to help prevent HIV infection. If you choose to take medicine to prevent HIV, you should first get tested for HIV. You should then be tested every 3 months for as long as you are taking the medicine. Pregnancy  If you are about to stop having your period (premenopausal) and you may become pregnant, seek counseling before you get pregnant.  Take 400 to 800 micrograms (mcg) of folic acid every day if you become pregnant.  Ask for birth control (contraception) if you want to prevent pregnancy. Osteoporosis and menopause Osteoporosis is a disease in which the bones lose minerals and strength with aging. This can result in bone fractures. If you are 38 years old or older, or if you are at risk for osteoporosis and fractures, ask your health care provider if you should:  Be screened for bone loss.  Take a calcium or vitamin D supplement to lower your risk of fractures.  Be given hormone replacement therapy (HRT) to treat symptoms of menopause. Follow these instructions at home: Lifestyle  Do not use any products that contain nicotine or tobacco, such as cigarettes, e-cigarettes, and chewing tobacco. If you need help quitting, ask your health care provider.  Do not use street drugs.  Do not share  needles.  Ask your health care provider for help if you need support or information about quitting drugs. Alcohol use  Do not drink alcohol if: ? Your health care provider tells you not to drink. ? You are pregnant, may be pregnant, or are planning to become pregnant.  If you drink alcohol: ? Limit how much you use to 0-1 drink a day. ? Limit intake if you are breastfeeding.  Be aware of how much alcohol is in your drink. In the [place] one drink equals one 12 oz bottle of beer (355 mL), one 5 oz glass of wine (148 mL), or one 1 oz glass of hard liquor (44 mL). General instructions  Schedule regular health, dental, and eye exams.  Stay current with your vaccines.  Tell your health care provider if: ? You often feel depressed. ? You have ever been abused or do not feel safe at home. Summary  Adopting a healthy lifestyle and getting preventive care are important in promoting health and wellness.  Follow your health care provider's instructions about healthy diet, exercising, and getting tested or screened for diseases.  Follow your health care provider's instructions  on monitoring your cholesterol and blood pressure. This information is not intended to replace advice given to you by your health care provider. Make sure you discuss any questions you have with your health care provider. Document Revised: 09/01/2018 Document Reviewed: 09/01/2018 Elsevier Patient Education  2020 [employer].

## 2020-01-02 NOTE — Progress Notes (Signed)
GYNECOLOGY CLINIC ANNUAL PREVENTATIVE CARE ENCOUNTER NOTE  Subjective:   Gwendolyn Lynch is a 34 y.o. [id number] female here for a routine annual gynecologic exam.  Current complaints: prolonged menses. States for the last 2 months her periods has lasted 13 days instead of her normal 5 days. States the first 5 days were normal flow, then spotting for the next week. States she has gained 20 lbs in the last 12 weeks due to staying at home with her kids & the pandemic. Denies discharge, pelvic pain, problems with intercourse or other gynecologic concerns.  Currently no contraception. She is wanting to have 1 more child before she turns 36.    Gynecologic History Patient's last menstrual period was 12/24/2019 (exact date). Contraception: none Last Pap: 2018. Results were: normal Last mammogram: n/a.   Obstetric History OB History  Gravida Para Term Preterm AB Living  5 5 5  0 0 5  SAB TAB Ectopic Multiple Live Births  0 0 0 0 5    # Outcome Date GA Lbr Len/2nd Weight Sex Delivery Anes PTL Lv  5 Term 04/22/17 [redacted]w[redacted]d  7 lb 15.2 oz (3.605 kg) M CS-LTranv Spinal  LIV  4 Term 11/01/15 [redacted]w[redacted]d  7 lb 11.8 oz (3.51 kg) M CS-LTranv Spinal  LIV  3 Term 2012 [redacted]w[redacted]d  6 lb (2.722 kg) M CS-LTranv Spinal N LIV  2 Term 2009 [redacted]w[redacted]d  5 lb 11 oz (2.58 kg) M CS-LTranv EPI N LIV  1 Term 2005 [redacted]w[redacted]d  5 lb 8 oz (2.495 kg) M Vag-Spont EPI  LIV    Past Medical History:  Diagnosis Date  . Hemorrhoids     Past Surgical History:  Procedure Laterality Date  . CESAREAN SECTION     C/S x 3  . CESAREAN SECTION N/A 11/01/2015   Procedure: CESAREAN SECTION;  Surgeon: 12/30/2015, MD;  Location: [facility];  Service: Obstetrics;  Laterality: N/A;  . CESAREAN SECTION N/A 04/22/2017   Procedure: REPEAT CESAREAN SECTION;  Surgeon: 06/22/2017, MD;  Location: [facility] BIRTHING SUITES;  Service: Obstetrics;  Laterality: N/A;    Current Outpatient Medications on File Prior to Visit  Medication Sig Dispense Refill  .  ondansetron (ZOFRAN) 4 MG tablet Take 1 tablet (4 mg total) by mouth every 6 (six) hours. 12 tablet 0  . cephALEXin (KEFLEX) 500 MG capsule Take 1 capsule (500 mg total) by mouth 2 (two) times daily. (Patient not taking: Reported on 01/02/2020) 14 capsule 0   No current facility-administered medications on file prior to visit.    No Known Allergies  Social History   Socioeconomic History  . Marital status: Single    Spouse name: Not on file  . Number of children: Not on file  . Years of education: Not on file  . Highest education level: Not on file  Occupational History  . Not on file  Tobacco Use  . Smoking status: Never Smoker  . Smokeless tobacco: Never Used  Substance and Sexual Activity  . Alcohol use: No  . Drug use: No  . Sexual activity: Never    Birth control/protection: None    Comment: last sex July 2018  Other Topics Concern  . Not on file  Social History Narrative  . Not on file   Social Determinants of Health   Financial Resource Strain:   . Difficulty of Paying Living Expenses:   Food Insecurity: No Food Insecurity  . Worried About August 2018 in the Last Year:  Never true  . Ran Out of Food in the Last Year: Never true  Transportation Needs: No Transportation Needs  . Lack of Transportation (Medical): No  . Lack of Transportation (Non-Medical): No  Physical Activity:   . Days of Exercise per Week:   . Minutes of Exercise per Session:   Stress:   . Feeling of Stress :   Social Connections:   . Frequency of Communication with Friends and Family:   . Frequency of Social Gatherings with Friends and Family:   . Attends Religious Services:   . Active Member of Clubs or Organizations:   . Attends Archivist Meetings:   Marland Kitchen Marital Status:   Intimate Partner Violence:   . Fear of Current or Ex-Partner:   . Emotionally Abused:   Marland Kitchen Physically Abused:   . Sexually Abused:     Family History  Problem Relation Age of Onset  . Diabetes  Mother   . Hypertension Mother   . Diabetes Father   . Hypertension Father     The following portions of the patient's history were reviewed and updated as appropriate: allergies, current medications, past family history, past medical history, past social history, past surgical history and problem list.  Review of Systems Pertinent items are noted in HPI.   Objective:  BP 114/85   Pulse 69   Ht 4\' 10"  (1.473 m)   Wt 162 lb 14.4 oz (73.9 kg)   LMP 12/24/2019 (Exact Date)   BMI 34.05 kg/m  CONSTITUTIONAL: Well-developed, well-nourished female in no acute distress.  HENT:  Normocephalic, atraumatic, External right and left ear normal. Oropharynx is clear and moist EYES: Conjunctivae and EOM are normal. Pupils are equal, round, and reactive to light. No scleral icterus.  NECK: Normal range of motion, supple, no masses.  Normal thyroid.  SKIN: Skin is warm and dry. No rash noted. Not diaphoretic. No erythema. No pallor. [place]: Alert and oriented to person, place, and time. Normal reflexes, muscle tone coordination. No cranial nerve deficit noted. PSYCHIATRIC: Normal mood and affect. Normal behavior. Normal judgment and thought content. CARDIOVASCULAR: Normal heart rate noted, regular rhythm RESPIRATORY: Clear to auscultation bilaterally. Effort and breath sounds normal, no problems with respiration noted. BREASTS: Symmetric in size. No masses, skin changes, nipple drainage, or lymphadenopathy. ABDOMEN: Soft, normal bowel sounds, no distention noted.  No tenderness, rebound or guarding.  PELVIC: Normal appearing external genitalia; normal appearing vaginal mucosa and cervix.  No abnormal discharge noted.  Pap smear obtained.  Normal uterine size, no other palpable masses, no uterine or adnexal tenderness. MUSCULOSKELETAL: Normal range of motion. No tenderness.  No cyanosis, clubbing, or edema.  2+ distal pulses.   Assessment:  Annual gynecologic examination with pap smear   Plan:    1. Encounter for well woman exam with routine gynecological exam  - Cytology - PAP( [place])  2. Screening for STD (sexually transmitted disease)  - HIV Antibody (routine testing w rflx) - RPR - Hepatitis C Antibody - Hepatitis B Surface AntiGEN    Jorje Guild, NP

## 2020-01-02 NOTE — Progress Notes (Signed)
Patient states does not want birth Control, wants one more baby before age 34.

## 2020-01-03 LAB — HIV ANTIBODY (ROUTINE TESTING W REFLEX): HIV Screen 4th Generation wRfx: NONREACTIVE

## 2020-01-03 LAB — HEPATITIS B SURFACE ANTIGEN: Hepatitis B Surface Ag: NEGATIVE

## 2020-01-03 LAB — HEPATITIS C ANTIBODY: Hep C Virus Ab: <0.1 s/co ratio (ref 0.0–0.9)

## 2020-01-03 LAB — RPR: RPR Ser Ql: NONREACTIVE

## 2020-01-04 LAB — CYTOLOGY - PAP
Chlamydia: NEGATIVE
Comment: NEGATIVE
Comment: NEGATIVE
Comment: NEGATIVE
Comment: NORMAL
Diagnosis: NEGATIVE
Diagnosis: REACTIVE
High risk HPV: NEGATIVE
Neisseria Gonorrhea: NEGATIVE
Trichomonas: NEGATIVE

## 2020-03-01 ENCOUNTER — Encounter: Payer: Self-pay | Admitting: Family Medicine

## 2020-03-01 ENCOUNTER — Ambulatory Visit (INDEPENDENT_AMBULATORY_CARE_PROVIDER_SITE_OTHER): Payer: Self-pay

## 2020-03-01 ENCOUNTER — Other Ambulatory Visit: Payer: Self-pay

## 2020-03-01 DIAGNOSIS — Z3201 Encounter for pregnancy test, result positive: Secondary | ICD-10-CM

## 2020-03-01 LAB — POCT PREGNANCY, URINE: Preg Test, Ur: POSITIVE — AB

## 2020-03-01 NOTE — Progress Notes (Signed)
Pt here today for pregnancy test resulted positive.   Pt denies vaginal bleeding or pain.  Pt reports LMP 12/24/19 which makes EDD 09/29/2020 and 9w 5d today.  Medications/allergies reviewed.  Proof of pregnancy letter provided by the front office to start prenatal care.  Addison Naegeli, RN  03/01/20

## 2020-03-08 ENCOUNTER — Inpatient Hospital Stay (HOSPITAL_COMMUNITY)
Admission: EM | Admit: 2020-03-08 | Discharge: 2020-03-09 | Disposition: A | Discharge status: Home / Self Care | Payer: Medicaid Other | Attending: Emergency Medicine | Admitting: Emergency Medicine

## 2020-03-08 ENCOUNTER — Other Ambulatory Visit: Payer: Self-pay

## 2020-03-08 ENCOUNTER — Encounter (HOSPITAL_COMMUNITY): Payer: Self-pay

## 2020-03-08 DIAGNOSIS — Z3A10 10 weeks gestation of pregnancy: Secondary | ICD-10-CM | POA: Insufficient documentation

## 2020-03-08 DIAGNOSIS — O34219 Maternal care for unspecified type scar from previous cesarean delivery: Secondary | ICD-10-CM | POA: Insufficient documentation

## 2020-03-08 DIAGNOSIS — O26851 Spotting complicating pregnancy, first trimester: Secondary | ICD-10-CM | POA: Diagnosis not present

## 2020-03-08 DIAGNOSIS — R102 Pelvic and perineal pain: Secondary | ICD-10-CM | POA: Insufficient documentation

## 2020-03-08 DIAGNOSIS — O210 Mild hyperemesis gravidarum: Secondary | ICD-10-CM | POA: Diagnosis present

## 2020-03-08 DIAGNOSIS — O3680X0 Pregnancy with inconclusive fetal viability, not applicable or unspecified: Secondary | ICD-10-CM | POA: Diagnosis not present

## 2020-03-08 DIAGNOSIS — O0941 Supervision of pregnancy with grand multiparity, first trimester: Secondary | ICD-10-CM | POA: Diagnosis not present

## 2020-03-08 DIAGNOSIS — O219 Vomiting of pregnancy, unspecified: Secondary | ICD-10-CM

## 2020-03-08 DIAGNOSIS — O26891 Other specified pregnancy related conditions, first trimester: Secondary | ICD-10-CM | POA: Diagnosis not present

## 2020-03-08 DIAGNOSIS — O209 Hemorrhage in early pregnancy, unspecified: Secondary | ICD-10-CM

## 2020-03-08 NOTE — Progress Notes (Signed)
Chart reviewed for nurse visit. Agree with plan of care.   Makelle Marrone I, NP 03/08/2020 5:50 PM  

## 2020-03-08 NOTE — ED Triage Notes (Signed)
Pt states unable to eat or drink x4 days. Also c/o N/V daily (2-3x per day). Pt thinks that this is due to the heat. NAD noted at this time.

## 2020-03-08 NOTE — ED Provider Notes (Signed)
[facility] EMERGENCY DEPARTMENT Provider Note   CSN: [id number] Arrival date & time: 03/08/20  2317     History Chief Complaint  Patient presents with  . Eating Disorder    Gwendolyn Lynch is a 34 y.o. female.  The history is provided by the patient and medical records.    34 y.o. G6P5 approx [redacted] weeks gestation, presenting to the ED for nausea/vomiting for the past 3 days.  States she has not been able to hold anything down.  She has continued trying to eat/drink small amounts but as soon as it goes down it comes back up.  She denies diarrhea.  No fever/chills.  Does report some lower abdominal cramping.  She did notice a small amount of discharge, no bleeding.  She has not yet seen OB-GYN-- states she has an upcoming virtual appointment.  She has not had any medications PTA.  Patient reports she has never had nausea like this in pregnancy before  Past Medical History:  Diagnosis Date  . Hemorrhoids     Patient Active Problem List   Diagnosis Date Noted  . 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 CESAREAN SECTION     C/S x 3  . CESAREAN SECTION N/A 11/01/2015   Procedure: CESAREAN SECTION;  Surgeon: Jaymes Graff, MD;  Location: [facility];  Service: Obstetrics;  Laterality: N/A;  . CESAREAN SECTION N/A 04/22/2017   Procedure: REPEAT CESAREAN SECTION;  Surgeon: Federico Flake, MD;  Location: [facility] BIRTHING SUITES;  Service: Obstetrics;  Laterality: N/A;     OB History    Gravida  6   Para  5   Term  5   Preterm  0   AB  0   Living  5     SAB  0   TAB  0   Ectopic  0   Multiple  0   Live Births  5           Family History  Problem Relation Age of Onset  . Diabetes Mother   . Hypertension Mother   . Diabetes Father   . Hypertension Father     Social History   Tobacco Use  . Smoking status: Never Smoker  . Smokeless tobacco: Never Used  Substance Use Topics  . Alcohol use:  No  . Drug use: No    Home Medications Prior to Admission medications   Not on File    Allergies    Patient has no known allergies.  Review of Systems   Review of Systems  Gastrointestinal: Positive for nausea and vomiting.  All other systems reviewed and are negative.   Physical Exam Updated Vital Signs BP 124/76 (BP Location: Left Arm)   Pulse 68   Temp 98.8 F (37.1 C) (Oral)   Resp 14   Ht 4\' 10"  (1.473 m)   Wt 73.5 kg   LMP 12/24/2019   SpO2 100%   BMI 33.86 kg/m   Physical Exam Vitals and nursing note reviewed.  Constitutional:      Appearance: She is well-developed.  HENT:     Head: Normocephalic and atraumatic.  Eyes:     Conjunctiva/sclera: Conjunctivae normal.     Pupils: Pupils are equal, round, and reactive to light.  Cardiovascular:     Rate and Rhythm: Normal rate and regular rhythm.     Heart sounds: Normal heart sounds.  Pulmonary:     Effort: Pulmonary effort is  normal.     Breath sounds: Normal breath sounds.  Abdominal:     General: Bowel sounds are normal.     Palpations: Abdomen is soft.  Musculoskeletal:        General: Normal range of motion.     Cervical back: Normal range of motion.  Skin:    General: Skin is warm and dry.  Neurological:     Mental Status: She is alert and oriented to person, place, and time.     ED Results / Procedures / Treatments   Labs (all labs ordered are listed, but only abnormal results are displayed) Labs Reviewed - No data to display  EKG None  Radiology No results found.  Procedures Procedures (including critical care time)  Medications Ordered in ED Medications - No data to display  ED Course  I have reviewed the triage vital signs and the nursing notes.  Pertinent labs & imaging results that were available during my care of the patient were reviewed by me and considered in my medical decision making (see chart for details).    MDM Rules/Calculators/A&P  34 year old G6 P5  approximately [redacted] weeks gestation here with 3 days of persistent nausea and vomiting.  She is continue trying to eat and drink small amounts but unable to hold anything down.  No nausea or vomiting before.  She does report some lower abdominal cramping and some vaginal discharge but denies any bleeding.  She is not had any fever or chills.  Her vitals are stable here.  Has not yet had ultrasound to confirm IUP.  Will transfer to MAU for ongoing evaluation/management.  Final Clinical Impression(s) / ED Diagnoses Final diagnoses:  Nausea and vomiting in pregnancy    Rx / DC Orders ED Discharge Orders    None       Larene Pickett, PA-C 03/08/20 [id number]    Palumbo, April, MD 03/08/20 2358

## 2020-03-09 ENCOUNTER — Inpatient Hospital Stay (HOSPITAL_COMMUNITY): Payer: Medicaid Other

## 2020-03-09 ENCOUNTER — Encounter (HOSPITAL_COMMUNITY): Payer: Self-pay

## 2020-03-09 DIAGNOSIS — O3680X0 Pregnancy with inconclusive fetal viability, not applicable or unspecified: Secondary | ICD-10-CM

## 2020-03-09 DIAGNOSIS — O209 Hemorrhage in early pregnancy, unspecified: Secondary | ICD-10-CM

## 2020-03-09 DIAGNOSIS — Z3A08 8 weeks gestation of pregnancy: Secondary | ICD-10-CM

## 2020-03-09 DIAGNOSIS — O219 Vomiting of pregnancy, unspecified: Secondary | ICD-10-CM

## 2020-03-09 DIAGNOSIS — O26891 Other specified pregnancy related conditions, first trimester: Secondary | ICD-10-CM

## 2020-03-09 DIAGNOSIS — R102 Pelvic and perineal pain: Secondary | ICD-10-CM

## 2020-03-09 LAB — URINALYSIS, ROUTINE W REFLEX MICROSCOPIC
Bacteria, UA: NONE SEEN
Bilirubin Urine: NEGATIVE
Glucose, UA: NEGATIVE mg/dL
Ketones, ur: NEGATIVE mg/dL
Leukocytes,Ua: NEGATIVE
Nitrite: NEGATIVE
Protein, ur: NEGATIVE mg/dL
Specific Gravity, Urine: 1.029 (ref 1.005–1.030)
pH: 5 (ref 5.0–8.0)

## 2020-03-09 LAB — CBC
HCT: 34.6 % — ABNORMAL LOW (ref 36.0–46.0)
Hemoglobin: 11 g/dL — ABNORMAL LOW (ref 12.0–15.0)
MCH: 25.5 pg — ABNORMAL LOW (ref 26.0–34.0)
MCHC: 31.8 g/dL (ref 30.0–36.0)
MCV: 80.3 fL (ref 80.0–100.0)
Platelets: 265 10*3/uL (ref 150–400)
RBC: 4.31 MIL/uL (ref 3.87–5.11)
RDW: 14.2 % (ref 11.5–15.5)
WBC: 8.2 10*3/uL (ref 4.0–10.5)
nRBC: 0 % (ref 0.0–0.2)

## 2020-03-09 LAB — WET PREP, GENITAL
Sperm: NONE SEEN
Trich, Wet Prep: NONE SEEN
WBC, Wet Prep HPF POC: NONE SEEN
Yeast Wet Prep HPF POC: NONE SEEN

## 2020-03-09 LAB — HIV ANTIBODY (ROUTINE TESTING W REFLEX): HIV Screen 4th Generation wRfx: NONREACTIVE

## 2020-03-09 LAB — HCG, QUANTITATIVE, PREGNANCY: hCG, Beta Chain, Quant, S: 78090 m[IU]/mL — ABNORMAL HIGH (ref <5)

## 2020-03-09 MED ORDER — PROMETHAZINE HCL 25 MG/ML IJ SOLN
12.5000 mg | Freq: Once | INTRAMUSCULAR | Status: AC
  Administered 2020-03-09: 12.5 mg via INTRAVENOUS
  Filled 2020-03-09: qty 1

## 2020-03-09 MED ORDER — SCOPOLAMINE 1 MG/3DAYS TD PT72: 1.0000 | MEDICATED_PATCH | TRANSDERMAL | 12 refills | Status: DC

## 2020-03-09 MED ORDER — SCOPOLAMINE 1 MG/3DAYS TD PT72
1.0000 | MEDICATED_PATCH | Freq: Once | TRANSDERMAL | Status: DC
  Administered 2020-03-09: 1.5 mg via TRANSDERMAL
  Filled 2020-03-09: qty 1

## 2020-03-09 MED ORDER — DOXYLAMINE-PYRIDOXINE 10-10 MG PO TBEC: 2.0000 | DELAYED_RELEASE_TABLET | Freq: Every evening | ORAL | 3 refills | Status: DC | PRN

## 2020-03-09 MED ORDER — LACTATED RINGERS IV SOLN: INTRAVENOUS | Status: DC

## 2020-03-09 NOTE — MAU Note (Signed)
Came from [facility] ED tonight due to pink spotting. Some pain on her side and lower abd. Having N/V and does not have meds for it

## 2020-03-09 NOTE — MAU Provider Note (Signed)
Chief Complaint: Morning Sickness and Vaginal Bleeding   First Provider Initiated Contact with Patient 03/09/20 0119        SUBJECTIVE HPI: Gwendolyn Lynch is a 34 y.o. [id number] at [redacted]w[redacted]d by LMP who presents to maternity admissions reporting vaginal spotting that is pink in color.  Has some mild, intermittent pain on her sides and lower abdomen.   Has had nausea and vomiting, but has not prescription for this. States has not been able to keep anything down lately. She denies vaginal itching/burning, urinary symptoms, h/a, dizziness, or fever/chills.    Vaginal Bleeding The patient's primary symptoms include pelvic pain and vaginal bleeding. The patient's pertinent negatives include no genital itching, genital lesions or genital odor. This is a new problem. The current episode started yesterday. The problem occurs intermittently. The problem has been unchanged. The pain is mild. She is pregnant. Associated symptoms include abdominal pain, nausea and vomiting. Pertinent negatives include no chills, constipation, diarrhea, dysuria, fever or headaches. The vaginal discharge was bloody. The vaginal bleeding is spotting. She has not been passing clots. She has not been passing tissue. Nothing aggravates the symptoms. She has tried nothing for the symptoms.  Emesis  This is a recurrent problem. The current episode started 1 to 4 weeks ago. The problem occurs intermittently. The problem has been unchanged. There has been no fever. Associated symptoms include abdominal pain. Pertinent negatives include no chills, diarrhea, fever or headaches. She has tried nothing for the symptoms.   RN Note: Came from [facility] ED tonight due to pink spotting. Some pain on her side and lower abd. Having N/V and does not have meds for it  Past Medical History:  Diagnosis Date  . Hemorrhoids    Past Surgical History:  Procedure Laterality Date  . CESAREAN SECTION     C/S x 3  . CESAREAN SECTION N/A 11/01/2015   Procedure:  CESAREAN SECTION;  Surgeon: Jaymes Graff, MD;  Location: [facility] ORS;  Service: Obstetrics;  Laterality: N/A;  . CESAREAN SECTION N/A 04/22/2017   Procedure: REPEAT CESAREAN SECTION;  Surgeon: Federico Flake, MD;  Location: [facility] BIRTHING SUITES;  Service: Obstetrics;  Laterality: N/A;   Social History   Socioeconomic History  . Marital status: Single    Spouse name: Not on file  . Number of children: Not on file  . Years of education: Not on file  . Highest education level: Not on file  Occupational History  . Not on file  Tobacco Use  . Smoking status: Never Smoker  . Smokeless tobacco: Never Used  Substance and Sexual Activity  . Alcohol use: No  . Drug use: No  . Sexual activity: Yes    Birth control/protection: None  Other Topics Concern  . Not on file  Social History Narrative  . Not on file   Social Determinants of Health   Financial Resource Strain:   . Difficulty of Paying Living Expenses:   Food Insecurity: No Food Insecurity  . Worried About Programme researcher, broadcasting/film/video in the Last Year: Never true  . Ran Out of Food in the Last Year: Never true  Transportation Needs: No Transportation Needs  . Lack of Transportation (Medical): No  . Lack of Transportation (Non-Medical): No  Physical Activity:   . Days of Exercise per Week:   . Minutes of Exercise per Session:   Stress:   . Feeling of Stress :   Social Connections:   . Frequency of Communication with Friends and Family:   .  Frequency of Social Gatherings with Friends and Family:   . Attends Religious Services:   . Active Member of Clubs or Organizations:   . Attends Archivist Meetings:   Marland Kitchen Marital Status:   Intimate Partner Violence:   . Fear of Current or Ex-Partner:   . Emotionally Abused:   Marland Kitchen Physically Abused:   . Sexually Abused:    No current facility-administered medications on file prior to encounter.   No current outpatient medications on file prior to encounter.   No Known  Allergies  I have reviewed patient's Past Medical Hx, Surgical Hx, Family Hx, Social Hx, medications and allergies.   ROS:  Review of Systems  Constitutional: Negative for chills and fever.  Gastrointestinal: Positive for abdominal pain, nausea and vomiting. Negative for constipation and diarrhea.  Genitourinary: Positive for pelvic pain and vaginal bleeding. Negative for dysuria.  Neurological: Negative for headaches.   Review of Systems  Other systems negative   Physical Exam  Physical Exam Patient Vitals for the past 24 hrs:  BP Temp Temp src Pulse Resp SpO2 Height Weight  03/09/20 0009 114/73 98.5 F (36.9 C) -- 70 16 -- 4\' 10"  (1.473 m) 72.6 kg  03/08/20 2325 124/76 98.8 F (37.1 C) Oral 68 14 100 % 4\' 10"  (1.473 m) 73.5 kg   Constitutional: Well-developed female in no acute distress.  Cardiovascular: normal rate Respiratory: normal effort GI: Abd soft, non-tender. Pos BS x 4 MS: Extremities nontender, no edema, normal ROM Neurologic: Alert and oriented x 4.  GU: Neg CVAT.  PELVIC EXAM: Cervix pink, visually closed, without lesion, scant pink discharge, vaginal walls and external genitalia normal Bimanual exam: Cervix 0/long/high, firm, anterior, neg CMT, uterus nontender, small, adnexa without tenderness, enlargement, or mass   LAB RESULTS Results for orders placed or performed during the hospital encounter of 03/08/20 (from the past 24 hour(s))  Urinalysis, Routine w reflex microscopic     Status: Abnormal   Collection Time: 03/09/20 12:15 AM  Result Value Ref Range   Color, Urine YELLOW YELLOW   APPearance CLEAR CLEAR   Specific Gravity, Urine 1.029 1.005 - 1.030   pH 5.0 5.0 - 8.0   Glucose, UA NEGATIVE NEGATIVE mg/dL   Hgb urine dipstick LARGE (A) NEGATIVE   Bilirubin Urine NEGATIVE NEGATIVE   Ketones, ur NEGATIVE NEGATIVE mg/dL   Protein, ur NEGATIVE NEGATIVE mg/dL   Nitrite NEGATIVE NEGATIVE   Leukocytes,Ua NEGATIVE NEGATIVE   RBC / HPF 0-5 0 - 5  RBC/hpf   WBC, UA 0-5 0 - 5 WBC/hpf   Bacteria, UA NONE SEEN NONE SEEN   Squamous Epithelial / LPF 0-5 0 - 5   Mucus PRESENT   Wet prep, genital     Status: Abnormal   Collection Time: 03/09/20  1:29 AM   Specimen: Vaginal  Result Value Ref Range   Yeast Wet Prep HPF POC NONE SEEN NONE SEEN   Trich, Wet Prep NONE SEEN NONE SEEN   Clue Cells Wet Prep HPF POC PRESENT (A) NONE SEEN   WBC, Wet Prep HPF POC NONE SEEN NONE SEEN   Sperm NONE SEEN   CBC     Status: Abnormal   Collection Time: 03/09/20  1:56 AM  Result Value Ref Range   WBC 8.2 4.0 - 10.5 K/uL   RBC 4.31 3.87 - 5.11 MIL/uL   Hemoglobin 11.0 (L) 12.0 - 15.0 g/dL   HCT 34.6 (L) 36 - 46 %   MCV 80.3 80.0 - 100.0 fL  MCH 25.5 (L) 26.0 - 34.0 pg   MCHC 31.8 30.0 - 36.0 g/dL   RDW 20.2 54.2 - 70.6 %   Platelets 265 150 - 400 K/uL   nRBC 0.0 0.0 - 0.2 %  hCG, quantitative, pregnancy     Status: Abnormal   Collection Time: 03/09/20  1:56 AM  Result Value Ref Range   hCG, Beta Chain, Quant, S 78,090 (H) <5 mIU/mL  HIV Antibody (routine testing w rflx)     Status: None   Collection Time: 03/09/20  1:56 AM  Result Value Ref Range   HIV Screen 4th Generation wRfx Non Reactive Non Reactive    IMAGING US OB Comp Less 14 Wks  Result Date: 03/09/2020 CLINICAL DATA:  Bleeding EXAM: OBSTETRIC <14 WK ULTRASOUND TECHNIQUE: Transabdominal ultrasound was performed for evaluation of the gestation as well as the maternal uterus and adnexal regions. COMPARISON:  None FINDINGS: Intrauterine gestational sac: Single Yolk sac:  Visualized Embryo:  Visualized Cardiac Activity: Visualized Heart Rate: 133 bpm MSD:    mm    w     d CRL:   10.1 mm   7 w 1 d                  [place] EDC: 10/25/2020 Subchorionic hemorrhage:  None visualized. Maternal uterus/adnexae: No adnexal mass or free fluid IMPRESSION: Seven week 1 day intrauterine pregnancy. Fetal heart rate 133 beats per minute. No acute maternal findings. Electronically Signed   By: Charlett Nose M.D.    On: 03/09/2020 03:20   MAU Management/MDM: Ordered usual first trimester r/o ectopic labs.   Pelvic exam and cultures done Will check baseline Ultrasound to rule out ectopic.  This bleeding/pain can represent a normal pregnancy with bleeding, spontaneous abortion or even an ectopic which can be life-threatening.  The process as listed above helps to determine which of these is present.  Results and ultrasound findings reviewed Reviewed these findings with patient.  Discussed she may continue to have spotting intermittently for several weeks.  Discussed signs to return for Recommend starting prenatal care soon.   ASSESSMENT Pregnancy at [redacted]w[redacted]d by LMP, [redacted]w[redacted]d by [place] today Pregnancy of unknown anatomic location Bleeding in first trimester of pregnancy Pelvic pain in early pregnancy Nausea and vomiting of pregnancy  PLAN Discharge home Rx Diclegis for nausea and vomiting Rx Scopolamine patch to augment antiemetic Recommend starting prenatal care soon.  Pt stable at time of discharge. Encouraged to return here or to other Urgent Care/ED if she develops worsening of symptoms, increase in pain, fever, or other concerning symptoms.    Wynelle Bourgeois CNM, MSN Certified Nurse-Midwife 03/09/2020  1:19 AM

## 2020-03-09 NOTE — Discharge Instructions (Signed)
Vaginal Bleeding During Pregnancy, First Trimester  A small amount of bleeding (spotting) from the vagina is common during early pregnancy. Sometimes the bleeding is normal and does not cause problems. At other times, though, bleeding may be a sign of something serious. Tell your doctor about any bleeding from your vagina right away. Follow these instructions at home: Activity  Follow your doctor's instructions about how active you can be.  If needed, make plans for someone to help with your normal activities.  Do not have sex or orgasms until your doctor says that this is safe. General instructions  Take over-the-counter and prescription medicines only as told by your doctor.  Watch your condition for any changes.  Write down: ? The number of pads you use each day. ? How often you change pads. ? How soaked (saturated) your pads are.  Do not use tampons.  Do not douche.  If you pass any tissue from your vagina, save it to show to your doctor.  Keep all follow-up visits as told by your doctor. This is important. Contact a doctor if:  You have vaginal bleeding at any time while you are pregnant.  You have cramps.  You have a fever. Get help right away if:  You have very bad cramps in your back or belly (abdomen).  You pass large clots or a lot of tissue from your vagina.  Your bleeding gets worse.  You feel light-headed.  You feel weak.  You pass out (faint).  You have chills.  You are leaking fluid from your vagina.  You have a gush of fluid from your vagina. Summary  Sometimes vaginal bleeding during pregnancy is normal and does not cause problems. At other times, bleeding may be a sign of something serious.  Tell your doctor about any bleeding from your vagina right away.  Follow your doctor's instructions about how active you can be. You may need someone to help you with your normal activities. This information is not intended to replace advice given to  you by your health care provider. Make sure you discuss any questions you have with your health care provider. Document Revised: 12/28/2018 Document Reviewed: 12/10/2016 Elsevier Patient Education  [place].  Morning Sickness  Morning sickness is when a woman feels nauseous during pregnancy. This nauseous feeling may or may not come with vomiting. It often occurs in the morning, but it can be a problem at any time of day. Morning sickness is most common during the first trimester. In some cases, it may continue throughout pregnancy. Although morning sickness is unpleasant, it is usually harmless unless the woman develops severe and continual vomiting (hyperemesis gravidarum), a condition that requires more intense treatment. What are the causes? The exact cause of this condition is not known, but it seems to be related to normal hormonal changes that occur in pregnancy. What increases the risk? You are more likely to develop this condition if:  You experienced nausea or vomiting before your pregnancy.  You had morning sickness during a previous pregnancy.  You are pregnant with more than one baby, such as twins. What are the signs or symptoms? Symptoms of this condition include:  Nausea.  Vomiting. How is this diagnosed? This condition is usually diagnosed based on your signs and symptoms. How is this treated? In many cases, treatment is not needed for this condition. Making some changes to what you eat may help to control symptoms. Your health care provider may also prescribe or recommend:  Vitamin B6 supplements.  Anti-nausea medicines.  Ginger. Follow these instructions at home: Medicines  Take over-the-counter and prescription medicines only as told by your health care provider. Do not use any prescription, over-the-counter, or herbal medicines for morning sickness without first talking with your health care provider.  Taking multivitamins before getting pregnant  can prevent or decrease the severity of morning sickness in most women. Eating and drinking  Eat a piece of dry toast or crackers before getting out of bed in the morning.  Eat 5 or 6 small meals a day.  Eat dry and bland foods, such as rice or a baked potato. Foods that are high in carbohydrates are often helpful.  Avoid greasy, fatty, and spicy foods.  Have someone cook for you if the smell of any food causes nausea and vomiting.  If you feel nauseous after taking prenatal vitamins, take the vitamins at night or with a snack.  Snack on protein foods between meals if you are hungry. Nuts, yogurt, and cheese are good options.  Drink fluids throughout the day.  Try ginger ale made with real ginger, ginger tea made from fresh grated ginger, or ginger candies. General instructions  Do not use any products that contain nicotine or tobacco, such as cigarettes and e-cigarettes. If you need help quitting, ask your health care provider.  Get an air purifier to keep the air in your house free of odors.  Get plenty of fresh air.  Try to avoid odors that trigger your nausea.  Consider trying these methods to help relieve symptoms: ? Wearing an acupressure wristband. These wristbands are often worn for seasickness. ? Acupuncture. Contact a health care provider if:  Your home remedies are not working and you need medicine.  You feel dizzy or light-headed.  You are losing weight. Get help right away if:  You have persistent and uncontrolled nausea and vomiting.  You faint.  You have severe pain in your abdomen. Summary  Morning sickness is when a woman feels nauseous during pregnancy. This nauseous feeling may or may not come with vomiting.  Morning sickness is most common during the first trimester.  It often occurs in the morning, but it can be a problem at any time of day.  In many cases, treatment is not needed for this condition. Making some changes to what you eat may  help to control symptoms. This information is not intended to replace advice given to you by your health care provider. Make sure you discuss any questions you have with your health care provider. Document Revised: 08/21/2017 Document Reviewed: 10/11/2016 Elsevier Patient Education  2020 [employer].

## 2020-03-12 LAB — GC/CHLAMYDIA PROBE AMP (~~LOC~~) NOT AT ARMC
Chlamydia: NEGATIVE
Comment: NEGATIVE
Comment: NORMAL
Neisseria Gonorrhea: NEGATIVE

## 2020-03-19 ENCOUNTER — Other Ambulatory Visit: Payer: Self-pay

## 2020-03-19 ENCOUNTER — Telehealth (INDEPENDENT_AMBULATORY_CARE_PROVIDER_SITE_OTHER): Payer: Self-pay | Admitting: *Deleted

## 2020-03-19 DIAGNOSIS — I10 Essential (primary) hypertension: Secondary | ICD-10-CM | POA: Insufficient documentation

## 2020-03-19 DIAGNOSIS — O0990 Supervision of high risk pregnancy, unspecified, unspecified trimester: Secondary | ICD-10-CM

## 2020-03-19 DIAGNOSIS — Z3490 Encounter for supervision of normal pregnancy, unspecified, unspecified trimester: Secondary | ICD-10-CM

## 2020-03-19 DIAGNOSIS — O99210 Obesity complicating pregnancy, unspecified trimester: Secondary | ICD-10-CM | POA: Insufficient documentation

## 2020-03-19 DIAGNOSIS — O10919 Unspecified pre-existing hypertension complicating pregnancy, unspecified trimester: Secondary | ICD-10-CM

## 2020-03-19 MED ORDER — BLOOD PRESSURE KIT DEVI: 1.0000 | Status: DC | PRN

## 2020-03-19 MED ORDER — NATACHEW 28-1 MG PO CHEW: 1.0000 | CHEWABLE_TABLET | Freq: Every day | ORAL | 9 refills | Status: DC

## 2020-03-19 NOTE — Patient Instructions (Signed)

## 2020-03-19 NOTE — Progress Notes (Addendum)
I connected with  Gwendolyn Lynch on 03/19/20 at 11:15 AM EDT by virtually and verified that I am speaking with the correct person using two identifiers.   I discussed the limitations, risks, security and privacy concerns of performing an evaluation and management servicevirtually and the availability of in person appointments. I also discussed with the patient that there may be a patient responsible charge related to this service. The patient expressed understanding and agreed to proceed.  I explained I am completing her New OB Intake today. We discussed Her EDD and that it is based on  An early [place]. She is [id number] with HX HTN per patient , not on meds currently.   I reviewed her allergies, meds, OB History, Medical /Surgical history, and appropriate screenings. I informed her of [facility] services.   I explained I will send her the [employer] app and app was sent to her while on phone.She will download the app later.    I explained we will send a blood pressure cuff to [facility] that will fill that prescription and I will call [facility] later  to verify they received her prescription and confirmed she will pick up the cuff.We discussed she has Family planning medicaid and it will not cover pregnancy or pregnancy related issues. She is going to apply for pregnancy medicaid.   I asked her to bring the blood pressure cuff with her to her first ob appointment so we can show her how to use it. Explained  then we will have her take her blood pressure weekly and enter into the app.  I explained she will have some visits in office and some virtually. She already has Sports coach.  I reviewed her new ob  appointment date/ time with her , our location and to wear mask, may bring one visitor who will also be screened.   I explained she will have a pelvic exam, ob bloodwork, hemoglobin a1C, cbg , and  genetic testing if desired,- she does want a panorama.  I explained I will  schedule an [place] at 19 weeks and  she will get a MyChart notification of  the appointment. She voices understanding.   Mashawn Brazil,RN 03/19/2020  11:45am

## 2020-03-21 ENCOUNTER — Encounter (HOSPITAL_COMMUNITY): Payer: Self-pay

## 2020-03-21 ENCOUNTER — Other Ambulatory Visit: Payer: Self-pay

## 2020-03-21 ENCOUNTER — Emergency Department (HOSPITAL_COMMUNITY)
Admission: EM | Admit: 2020-03-21 | Discharge: 2020-03-22 | Disposition: A | Discharge status: Home / Self Care | Payer: Medicaid Other | Attending: Emergency Medicine | Admitting: Emergency Medicine

## 2020-03-21 DIAGNOSIS — O210 Mild hyperemesis gravidarum: Secondary | ICD-10-CM | POA: Diagnosis not present

## 2020-03-21 DIAGNOSIS — Z79899 Other long term (current) drug therapy: Secondary | ICD-10-CM | POA: Insufficient documentation

## 2020-03-21 DIAGNOSIS — O219 Vomiting of pregnancy, unspecified: Secondary | ICD-10-CM | POA: Diagnosis present

## 2020-03-21 DIAGNOSIS — Z3A00 Weeks of gestation of pregnancy not specified: Secondary | ICD-10-CM | POA: Insufficient documentation

## 2020-03-21 LAB — CBC WITH DIFFERENTIAL/PLATELET
Abs Immature Granulocytes: 0.06 10*3/uL (ref 0.00–0.07)
Basophils Absolute: 0 10*3/uL (ref 0.0–0.1)
Basophils Relative: 0 %
Eosinophils Absolute: 0.2 10*3/uL (ref 0.0–0.5)
Eosinophils Relative: 3 %
HCT: 31.7 % — ABNORMAL LOW (ref 36.0–46.0)
Hemoglobin: 10.3 g/dL — ABNORMAL LOW (ref 12.0–15.0)
Immature Granulocytes: 1 %
Lymphocytes Relative: 22 %
Lymphs Abs: 1.8 10*3/uL (ref 0.7–4.0)
MCH: 26.1 pg (ref 26.0–34.0)
MCHC: 32.5 g/dL (ref 30.0–36.0)
MCV: 80.3 fL (ref 80.0–100.0)
Monocytes Absolute: 0.6 10*3/uL (ref 0.1–1.0)
Monocytes Relative: 8 %
Neutro Abs: 5.4 10*3/uL (ref 1.7–7.7)
Neutrophils Relative %: 66 %
Platelets: 304 10*3/uL (ref 150–400)
RBC: 3.95 MIL/uL (ref 3.87–5.11)
RDW: 14.7 % (ref 11.5–15.5)
WBC: 8.1 10*3/uL (ref 4.0–10.5)
nRBC: 0 % (ref 0.0–0.2)

## 2020-03-21 LAB — URINALYSIS, ROUTINE W REFLEX MICROSCOPIC
Bacteria, UA: NONE SEEN
Bilirubin Urine: NEGATIVE
Glucose, UA: 50 mg/dL — AB
Ketones, ur: NEGATIVE mg/dL
Leukocytes,Ua: NEGATIVE
Nitrite: NEGATIVE
Protein, ur: 30 mg/dL — AB
Specific Gravity, Urine: 1.028 (ref 1.005–1.030)
pH: 5 (ref 5.0–8.0)

## 2020-03-21 LAB — COMPREHENSIVE METABOLIC PANEL
ALT: 23 U/L (ref 0–44)
AST: 21 U/L (ref 15–41)
Albumin: 3.1 g/dL — ABNORMAL LOW (ref 3.5–5.0)
Alkaline Phosphatase: 46 U/L (ref 38–126)
Anion gap: 8 (ref 5–15)
BUN: 8 mg/dL (ref 6–20)
CO2: 22 mmol/L (ref 22–32)
Calcium: 9 mg/dL (ref 8.9–10.3)
Chloride: 107 mmol/L (ref 98–111)
Creatinine, Ser: 0.71 mg/dL (ref 0.44–1.00)
GFR calc Af Amer: >60 mL/min (ref >60)
GFR calc non Af Amer: >60 mL/min (ref >60)
Glucose, Bld: 84 mg/dL (ref 70–99)
Potassium: 3.8 mmol/L (ref 3.5–5.1)
Sodium: 137 mmol/L (ref 135–145)
Total Bilirubin: 0.5 mg/dL (ref 0.3–1.2)
Total Protein: 6.2 g/dL — ABNORMAL LOW (ref 6.5–8.1)

## 2020-03-21 LAB — LIPASE, BLOOD: Lipase: 42 U/L (ref 11–51)

## 2020-03-21 MED ORDER — DOXYLAMINE-PYRIDOXINE 10-10 MG PO TBEC: 2.0000 | DELAYED_RELEASE_TABLET | Freq: Every evening | ORAL | 3 refills | Status: DC | PRN

## 2020-03-21 MED ORDER — LACTATED RINGERS IV BOLUS
1000.0000 mL | Freq: Once | INTRAVENOUS | Status: AC
  Administered 2020-03-21: 1000 mL via INTRAVENOUS

## 2020-03-21 NOTE — ED Notes (Signed)
Pt given water and is aware we need urine sample.

## 2020-03-21 NOTE — ED Provider Notes (Signed)
[facility] DEPARTMENT Provider Note   CSN: [id number] Arrival date & time: 03/21/20  1720     History Chief Complaint  Patient presents with  . Heat Exposure  . Routine Prenatal Visit    Gwendolyn Lynch is a 34 y.o. female.  34 yo F gravida 6 para 5 with a chief complaints of morning sickness.  Patient has had this off and on during this pregnancy.  She has been using scopolamine patches with minimal relief.  Has had a lot of difficulty eating and drinking for the past 2 or 3 days.  She had a clot at work over that timeframe.  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  There is some concern for heat exposure and she was given some IV fluids and some Zofran with improvement.  She feels like her sides are cramping worse on the right than the left.  Denies vaginal bleeding or discharge.  She estimates she is about [redacted] weeks pregnant.  The history is provided by the patient.  Illness Severity:  Moderate Onset quality:  Gradual Duration:  3 days Timing:  Constant Progression:  Worsening Chronicity:  New Associated symptoms: abdominal pain, nausea and vomiting   Associated symptoms: no chest pain, no congestion, no fever, no headaches, no myalgias, no rhinorrhea, no shortness of breath and no wheezing        Past Medical History:  Diagnosis Date  . Anemia   . Hemorrhoids   . Hypertension    not on meds    Patient Active Problem List   Diagnosis Date Noted  . Supervision of high risk pregnancy, antepartum 03/19/2020  . Obesity affecting pregnancy 03/19/2020  . Hypertension   . 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 CESAREAN SECTION     C/S x 3  . CESAREAN SECTION N/A 11/01/2015   Procedure: CESAREAN SECTION;  Surgeon: Crawford Givens, MD;  Location: [facility];  Service: Obstetrics;  Laterality: N/A;  . CESAREAN SECTION N/A 04/22/2017     Procedure: REPEAT CESAREAN SECTION;  Surgeon: Caren Macadam, MD;  Location: [facility];  Service: Obstetrics;  Laterality: N/A;     OB History    Gravida  6   Para  5   Term  5   Preterm  0   AB  0   Living  5     SAB  0   TAB  0   Ectopic  0   Multiple  0   Live Births  5           Family History  Problem Relation Age of Onset  . Diabetes Mother   . Hypertension Mother   . Diabetes Father   . Hypertension Father     Social History   Tobacco Use  . Smoking status: Never Smoker  . Smokeless tobacco: Never Used  Vaping Use  . Vaping Use: Never used  Substance Use Topics  . Alcohol use: No  . Drug use: No    Home Medications Prior to Admission medications   Medication Sig Start Date End Date Taking? Authorizing Provider  Blood Pressure Monitoring (BLOOD PRESSURE KIT) DEVI 1 Device by Does not apply route as needed. 03/19/20   Woodroe Mode, MD  Doxylamine-Pyridoxine (DICLEGIS) 10-10 MG TBEC Take 2 tablets by mouth at bedtime as needed. 03/21/20  Melene Plan, DO  Prenatal Vit-Fe Fum-Fe Bisg-FA (NATACHEW) 28-1 MG CHEW Chew 1 tablet by mouth daily. 03/19/20   Adam Phenix, MD  scopolamine (TRANSDERM-SCOP, 1.5 MG,) 1 MG/3DAYS Place 1 patch (1.5 mg total) onto the skin every 3 (three) days. 03/09/20   Aviva Signs, CNM    Allergies    Patient has no known allergies.  Review of Systems   Review of Systems  Constitutional: Negative for chills and fever.  HENT: Negative for congestion and rhinorrhea.   Eyes: Negative for redness and visual disturbance.  Respiratory: Negative for shortness of breath and wheezing.   Cardiovascular: Negative for chest pain and palpitations.  Gastrointestinal: Positive for abdominal pain, nausea and vomiting.  Genitourinary: Negative for dysuria and urgency.  Musculoskeletal: Negative for arthralgias and myalgias.  Skin: Negative for pallor and wound.  Neurological: Negative for dizziness and  headaches.    Physical Exam Updated Vital Signs BP 114/72   Pulse 73   Temp 98.7 F (37.1 C) (Oral)   Resp 17   LMP 12/24/2019   SpO2 100%   Physical Exam Vitals and nursing note reviewed.  Constitutional:      General: She is not in acute distress.    Appearance: She is well-developed. She is not diaphoretic.  HENT:     Head: Normocephalic and atraumatic.  Eyes:     Pupils: Pupils are equal, round, and reactive to light.  Cardiovascular:     Rate and Rhythm: Normal rate and regular rhythm.     Heart sounds: No murmur heard.  No friction rub. No gallop.   Pulmonary:     Effort: Pulmonary effort is normal.     Breath sounds: No wheezing or rales.  Abdominal:     General: There is no distension.     Palpations: Abdomen is soft.     Tenderness: There is no abdominal tenderness.     Comments: Fundal height below the umbilicus.  Nontender.  Musculoskeletal:        General: No tenderness.     Cervical back: Normal range of motion and neck supple.  Skin:    General: Skin is warm and dry.  Neurological:     Mental Status: She is alert and oriented to person, place, and time.  Psychiatric:        Behavior: Behavior normal.     ED Results / Procedures / Treatments   Labs (all labs ordered are listed, but only abnormal results are displayed) Labs Reviewed  CBC WITH DIFFERENTIAL/PLATELET - Abnormal; Notable for the following components:      Result Value   Hemoglobin 10.3 (*)    HCT 31.7 (*)    All other components within normal limits  URINALYSIS, ROUTINE W REFLEX MICROSCOPIC - Abnormal; Notable for the following components:   APPearance HAZY (*)    Glucose, UA 50 (*)    Hgb urine dipstick MODERATE (*)    Protein, ur 30 (*)    All other components within normal limits  COMPREHENSIVE METABOLIC PANEL - Abnormal; Notable for the following components:   Total Protein 6.2 (*)    Albumin 3.1 (*)    All other components within normal limits  LIPASE, BLOOD    EKG EKG  Interpretation  Date/Time:  Wednesday March 21 2020 17:26:39 EDT Ventricular Rate:  77 PR Interval:    QRS Duration: 89 QT Interval:  375 QTC Calculation: 425 R Axis:   78 Text Interpretation: Sinus rhythm Borderline T abnormalities, anterior leads  downsloping st segments in the anterior leads resolved Otherwise no significant change Confirmed by Deno Etienne [phone]) on 03/21/2020 5:29:20 PM   Radiology No results found.  Procedures Procedures (including critical care time)  Medications Ordered in ED Medications  lactated ringers bolus 1,000 mL (0 mLs Intravenous Stopped 03/21/20 1937)  lactated ringers bolus 1,000 mL (1,000 mLs Intravenous Bolus from Bag 03/21/20 1955)    ED Course  I have reviewed the triage vital signs and the nursing notes.  Pertinent labs & imaging results that were available during my care of the patient were reviewed by me and considered in my medical decision making (see chart for details).    MDM Rules/Calculators/A&P                          34 yo F with a chief complaints of nausea and vomiting.  She has been having trouble with this over this pregnancy.  Had an episode where she was at work and felt that she was too unwell to continue.  Appears well on my exam.  Not tachycardic not hypotensive.  We will give a bolus of IV fluids check lab work oral trial reassess.  Tolerating p.o. without difficulty.  Lab work unremarkable no LFT elevation lipase is normal UA without infection.  Will discharge the patient home.  OB/GYN follow-up.  Represcribed D[place].   8:19 PM:  I have discussed the diagnosis/risks/treatment options with the patient and believe the pt to be eligible for discharge home to follow-up with OB. We also discussed returning to the ED immediately if new or worsening sx occur. We discussed the sx which are most concerning (e.g., sudden worsening pain, fever, inability to tolerate by mouth, vaginal bleeding, gush of fluids) that necessitate  immediate return. Medications administered to the patient during their visit and any new prescriptions provided to the patient are listed below.  Medications given during this visit Medications  lactated ringers bolus 1,000 mL (0 mLs Intravenous Stopped 03/21/20 1937)  lactated ringers bolus 1,000 mL (1,000 mLs Intravenous Bolus from Bag 03/21/20 1955)     The patient appears reasonably screen and/or stabilized for discharge and I doubt any other medical condition or other [facility] requiring further screening, evaluation, or treatment in the ED at this time prior to discharge.   Final Clinical Impression(s) / ED Diagnoses Final diagnoses:  Hyperemesis gravidarum    Rx / DC Orders ED Discharge Orders         Ordered    Doxylamine-Pyridoxine (DICLEGIS) 10-10 MG TBEC  At bedtime PRN     Discontinue  Reprint    Note to Pharmacy: 2 tabs at bedtime. If needed, add 1 tab in the AM If she cannot afford this med, can you instruct her on how to use Unisom and B6 as substitute?   03/21/20 2016           Deno Etienne, DO 03/21/20 2019

## 2020-03-21 NOTE — Discharge Instructions (Addendum)
Go to a drugstore and buy unisom and vitaminb6(pyridoxine) Take 1/2 a tab of unisom(12.5mg) and 25mg of vitamin b6.  Take this at night before bed.  If you continue to have nausea and vomiting take twice a day.  [facility]. °

## 2020-03-21 NOTE — ED Triage Notes (Signed)
Pt BIB [facility]. 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 Pt began to experience a headache, N/V, blurred vision and cramping down both sides. EMS gave pt 4mg  of zofran and 500 NS bolus.

## 2020-03-22 ENCOUNTER — Ambulatory Visit (INDEPENDENT_AMBULATORY_CARE_PROVIDER_SITE_OTHER): Payer: Self-pay | Admitting: Obstetrics & Gynecology

## 2020-03-22 ENCOUNTER — Encounter: Payer: Self-pay | Admitting: Obstetrics & Gynecology

## 2020-03-22 DIAGNOSIS — Z3A09 9 weeks gestation of pregnancy: Secondary | ICD-10-CM

## 2020-03-22 DIAGNOSIS — Z98891 History of uterine scar from previous surgery: Secondary | ICD-10-CM

## 2020-03-22 DIAGNOSIS — O0990 Supervision of high risk pregnancy, unspecified, unspecified trimester: Secondary | ICD-10-CM

## 2020-03-22 DIAGNOSIS — O0991 Supervision of high risk pregnancy, unspecified, first trimester: Secondary | ICD-10-CM

## 2020-03-22 MED ORDER — ASPIRIN EC 81 MG PO TBEC: 81.0000 mg | DELAYED_RELEASE_TABLET | Freq: Every day | ORAL | 11 refills | Status: DC

## 2020-03-22 NOTE — Progress Notes (Addendum)
No heart tones obtained. Will have Nurse do bedside U/S  Provider Completed bedside U/S.

## 2020-03-22 NOTE — Patient Instructions (Signed)
First Trimester of Pregnancy The first trimester of pregnancy is from week 1 until the end of week 13 (months 1 through 3). A week after a sperm fertilizes an egg, the egg will implant on the wall of the uterus. This embryo will begin to develop into a baby. Genes from you and your partner will form the baby. The female genes will determine whether the baby will be a boy or a girl. At 6-8 weeks, the eyes and face will be formed, and the heartbeat can be seen on ultrasound. At the end of 12 weeks, all the baby's organs will be formed. Now that you are pregnant, you will want to do everything you can to have a healthy baby. Two of the most important things are to get good prenatal care and to follow your health care provider's instructions. Prenatal care is all the medical care you receive before the baby's birth. This care will help prevent, find, and treat any problems during the pregnancy and childbirth. Body changes during your first trimester Your body goes through many changes during pregnancy. The changes vary from woman to woman.  You may gain or lose a couple of pounds at first.  You may feel sick to your stomach (nauseous) and you may throw up (vomit). If the vomiting is uncontrollable, call your health care provider.  You may tire easily.  You may develop headaches that can be relieved by medicines. All medicines should be approved by your health care provider.  You may urinate more often. Painful urination may mean you have a bladder infection.  You may develop heartburn as a result of your pregnancy.  You may develop constipation because certain hormones are causing the muscles that push stool through your intestines to slow down.  You may develop hemorrhoids or swollen veins (varicose veins).  Your breasts may begin to grow larger and become tender. Your nipples may stick out more, and the tissue that surrounds them (areola) may become darker.  Your gums may bleed and may be  sensitive to brushing and flossing.  Dark spots or blotches (chloasma, mask of pregnancy) may develop on your face. This will likely fade after the baby is born.  Your menstrual periods will stop.  You may have a loss of appetite.  You may develop cravings for certain kinds of food.  You may have changes in your emotions from day to day, such as being excited to be pregnant or being concerned that something may go wrong with the pregnancy and baby.  You may have more vivid and strange dreams.  You may have changes in your hair. These can include thickening of your hair, rapid growth, and changes in texture. Some women also have hair loss during or after pregnancy, or hair that feels dry or thin. Your hair will most likely return to normal after your baby is born. What to expect at prenatal visits During a routine prenatal visit:  You will be weighed to make sure you and the baby are growing normally.  Your blood pressure will be taken.  Your abdomen will be measured to track your baby's growth.  The fetal heartbeat will be listened to between weeks 10 and 14 of your pregnancy.  Test results from any previous visits will be discussed. Your health care provider may ask you:  How you are feeling.  If you are feeling the baby move.  If you have had any abnormal symptoms, such as leaking fluid, bleeding, severe headaches, or abdominal   cramping.  If you are using any tobacco products, including cigarettes, chewing tobacco, and electronic cigarettes.  If you have any questions. Other tests that may be performed during your first trimester include:  Blood tests to find your blood type and to check for the presence of any previous infections. The tests will also be used to check for low iron levels (anemia) and protein on red blood cells (Rh antibodies). Depending on your risk factors, or if you previously had diabetes during pregnancy, you may have tests to check for high blood sugar  that affects pregnant women (gestational diabetes).  Urine tests to check for infections, diabetes, or protein in the urine.  An ultrasound to confirm the proper growth and development of the baby.  Fetal screens for spinal cord problems (spina bifida) and Down syndrome.  HIV (human immunodeficiency virus) testing. Routine prenatal testing includes screening for HIV, unless you choose not to have this test.  You may need other tests to make sure you and the baby are doing well. Follow these instructions at home: Medicines  Follow your health care provider's instructions regarding medicine use. Specific medicines may be either safe or unsafe to take during pregnancy.  Take a prenatal vitamin that contains at least 600 micrograms (mcg) of folic acid.  If you develop constipation, try taking a stool softener if your health care provider approves. Eating and drinking   Eat a balanced diet that includes fresh fruits and vegetables, whole grains, good sources of protein such as meat, eggs, or tofu, and low-fat dairy. Your health care provider will help you determine the amount of weight gain that is right for you.  Avoid raw meat and uncooked cheese. These carry germs that can cause birth defects in the baby.  Eating four or five small meals rather than three large meals a day may help relieve nausea and vomiting. If you start to feel nauseous, eating a few soda crackers can be helpful. Drinking liquids between meals, instead of during meals, also seems to help ease nausea and vomiting.  Limit foods that are high in fat and processed sugars, such as fried and sweet foods.  To prevent constipation: ? Eat foods that are high in fiber, such as fresh fruits and vegetables, whole grains, and beans. ? Drink enough fluid to keep your urine clear or pale yellow. Activity  Exercise only as directed by your health care provider. Most women can continue their usual exercise routine during  pregnancy. Try to exercise for 30 minutes at least 5 days a week. Exercising will help you: ? Control your weight. ? Stay in shape. ? Be prepared for labor and delivery.  Experiencing pain or cramping in the lower abdomen or lower back is a good sign that you should stop exercising. Check with your health care provider before continuing with normal exercises.  Try to avoid standing for long periods of time. Move your legs often if you must stand in one place for a long time.  Avoid heavy lifting.  Wear low-heeled shoes and practice good posture.  You may continue to have sex unless your health care provider tells you not to. Relieving pain and discomfort  Wear a good support bra to relieve breast tenderness.  Take warm sitz baths to soothe any pain or discomfort caused by hemorrhoids. Use hemorrhoid cream if your health care provider approves.  Rest with your legs elevated if you have leg cramps or low back pain.  If you develop varicose veins in   your legs, wear support hose. Elevate your feet for 15 minutes, 3-4 times a day. Limit salt in your diet. Prenatal care  Schedule your prenatal visits by the twelfth week of pregnancy. They are usually scheduled monthly at first, then more often in the last 2 months before delivery.  Write down your questions. Take them to your prenatal visits.  Keep all your prenatal visits as told by your health care provider. This is important. Safety  Wear your seat belt at all times when driving.  Make a list of emergency phone numbers, including numbers for family, friends, the hospital, and police and fire departments. General instructions  Ask your health care provider for a referral to a local prenatal education class. Begin classes no later than the beginning of month 6 of your pregnancy.  Ask for help if you have counseling or nutritional needs during pregnancy. Your health care provider can offer advice or refer you to specialists for help  with various needs.  Do not use hot tubs, steam rooms, or saunas.  Do not douche or use tampons or scented sanitary pads.  Do not cross your legs for long periods of time.  Avoid cat litter boxes and soil used by cats. These carry germs that can cause birth defects in the baby and possibly loss of the fetus by miscarriage or stillbirth.  Avoid all smoking, herbs, alcohol, and medicines not prescribed by your health care provider. Chemicals in these products affect the formation and growth of the baby.  Do not use any products that contain nicotine or tobacco, such as cigarettes and e-cigarettes. If you need help quitting, ask your health care provider. You may receive counseling support and other resources to help you quit.  Schedule a dentist appointment. At home, brush your teeth with a soft toothbrush and be gentle when you floss. Contact a health care provider if:  You have dizziness.  You have mild pelvic cramps, pelvic pressure, or nagging pain in the abdominal area.  You have persistent nausea, vomiting, or diarrhea.  You have a bad smelling vaginal discharge.  You have pain when you urinate.  You notice increased swelling in your face, hands, legs, or ankles.  You are exposed to fifth disease or chickenpox.  You are exposed to German measles (rubella) and have never had it. Get help right away if:  You have a fever.  You are leaking fluid from your vagina.  You have spotting or bleeding from your vagina.  You have severe abdominal cramping or pain.  You have rapid weight gain or loss.  You vomit blood or material that looks like coffee grounds.  You develop a severe headache.  You have shortness of breath.  You have any kind of trauma, such as from a fall or a car accident. Summary  The first trimester of pregnancy is from week 1 until the end of week 13 (months 1 through 3).  Your body goes through many changes during pregnancy. The changes vary from  woman to woman.  You will have routine prenatal visits. During those visits, your health care provider will examine you, discuss any test results you may have, and talk with you about how you are feeling. This information is not intended to replace advice given to you by your health care provider. Make sure you discuss any questions you have with your health care provider. Document Revised: 08/21/2017 Document Reviewed: 08/20/2016 Elsevier Patient Education  2020 [employer] Inc[employer]  

## 2020-03-22 NOTE — Progress Notes (Signed)
Subjective:    Gwendolyn Lynch is a [age] [redacted]w[redacted]d being seen today for her first obstetrical visit.  Her obstetrical history is significant for 4 previous cesarean sections, h/o HTN. Patient does intend to breast feed. Pregnancy history fully reviewed.  Patient reports nausea and vomiting.  Vitals:   03/22/20 0936  BP: 113/80  Pulse: 72  Weight: 167 lb 12.8 oz (76.1 kg)    HISTORY: OB History  Gravida Para Term Preterm AB Living  6 5 5  0 0 5  SAB TAB Ectopic Multiple Live Births  0 0 0 0 5    # Outcome Date GA Lbr Len/2nd Weight Sex Delivery Anes PTL Lv  6 Current           5 Term 04/22/17 [redacted]w[redacted]d  7 lb 15.2 oz (3.605 kg) M CS-LTranv Spinal  LIV     Birth Comments: HTN not on meds  4 Term 11/01/15 [redacted]w[redacted]d  7 lb 11.8 oz (3.51 kg) M CS-LTranv Spinal  LIV  3 Term 2012 [redacted]w[redacted]d  6 lb (2.722 kg) M CS-LTranv Spinal N LIV  2 Term 2009 [redacted]w[redacted]d  5 lb 11 oz (2.58 kg) M CS-LTranv EPI N LIV     Birth Comments: IOL, failed IOL   1 Term 2005 [redacted]w[redacted]d  5 lb 8 oz (2.495 kg) M Vag-Spont EPI  LIV   Past Medical History:  Diagnosis Date  . Anemia   . Hemorrhoids   . Hypertension    not on meds   Past Surgical History:  Procedure Laterality Date  . CESAREAN SECTION     C/S x 3  . CESAREAN SECTION N/A 11/01/2015   Procedure: CESAREAN SECTION;  Surgeon: 12/30/2015, MD;  Location: [facility];  Service: Obstetrics;  Laterality: N/A;  . CESAREAN SECTION N/A 04/22/2017   Procedure: REPEAT CESAREAN SECTION;  Surgeon: 06/22/2017, MD;  Location: [facility];  Service: Obstetrics;  Laterality: N/A;   Family History  Problem Relation Age of Onset  . Diabetes Mother   . Hypertension Mother   . Diabetes Father   . Hypertension Father      Exam    Uterus:     Pelvic Exam:    Perineum: No Hemorrhoids   Vulva: normal   Vagina:  normal mucosa   pH:     Cervix: no lesions   Adnexa: normal adnexa   Bony Pelvis: average  System: Breast:  deferred, see exam 12/2019   Skin: normal  coloration and turgor, no rashes    Neurologic: oriented, normal mood   Extremities: normal strength, tone, and muscle mass   HEENT PERRLA and neck supple with midline trachea   Mouth/Teeth     Neck supple   Cardiovascular: regular rate and rhythm   Respiratory:  appears well, vitals normal, no respiratory distress, acyanotic, normal RR, neck free of mass or lymphadenopathy, chest clear, no wheezing, crepitations, rhonchi, normal symmetric air entry   Abdomen: soft, non-tender; bowel sounds normal; no masses,  no organomegaly   Urinary: urethral meatus normal      Assessment:    Pregnancy: 01/2020 Patient Active Problem List   Diagnosis Date Noted  . Supervision of high risk pregnancy, antepartum 03/19/2020  . Obesity affecting pregnancy 03/19/2020  . Hypertension   . Thalassemia carrier 12/07/2016        Plan:     Initial labs drawn. Prenatal vitamins. Problem list reviewed and updated. Genetic Screening discussed will obtain after 10 weeks  Ultrasound discussed; fetal survey:  ordered.  Follow up in 2 weeks. 50% of 30 min visit spent on counseling and coordination of care.  Discussed contraception, does not plan to have more children but has not decided what method to use   Scheryl Darter 03/22/2020

## 2020-03-23 ENCOUNTER — Other Ambulatory Visit: Payer: Self-pay

## 2020-03-23 ENCOUNTER — Inpatient Hospital Stay (HOSPITAL_COMMUNITY)
Admission: AD | Admit: 2020-03-23 | Discharge: 2020-03-24 | Disposition: A | Discharge status: Home / Self Care | Payer: Medicaid Other | Attending: Family Medicine | Admitting: Family Medicine

## 2020-03-23 ENCOUNTER — Encounter (HOSPITAL_COMMUNITY): Payer: Self-pay | Admitting: Family Medicine

## 2020-03-23 ENCOUNTER — Inpatient Hospital Stay (HOSPITAL_COMMUNITY): Payer: Medicaid Other

## 2020-03-23 DIAGNOSIS — O0990 Supervision of high risk pregnancy, unspecified, unspecified trimester: Secondary | ICD-10-CM

## 2020-03-23 DIAGNOSIS — O0941 Supervision of pregnancy with grand multiparity, first trimester: Secondary | ICD-10-CM | POA: Insufficient documentation

## 2020-03-23 DIAGNOSIS — R109 Unspecified abdominal pain: Secondary | ICD-10-CM | POA: Diagnosis not present

## 2020-03-23 DIAGNOSIS — Z8249 Family history of ischemic heart disease and other diseases of the circulatory system: Secondary | ICD-10-CM | POA: Diagnosis not present

## 2020-03-23 DIAGNOSIS — Z79899 Other long term (current) drug therapy: Secondary | ICD-10-CM | POA: Diagnosis not present

## 2020-03-23 DIAGNOSIS — Z98891 History of uterine scar from previous surgery: Secondary | ICD-10-CM | POA: Diagnosis not present

## 2020-03-23 DIAGNOSIS — Z8719 Personal history of other diseases of the digestive system: Secondary | ICD-10-CM | POA: Insufficient documentation

## 2020-03-23 DIAGNOSIS — O26891 Other specified pregnancy related conditions, first trimester: Secondary | ICD-10-CM | POA: Diagnosis not present

## 2020-03-23 DIAGNOSIS — O10911 Unspecified pre-existing hypertension complicating pregnancy, first trimester: Secondary | ICD-10-CM | POA: Diagnosis not present

## 2020-03-23 DIAGNOSIS — O209 Hemorrhage in early pregnancy, unspecified: Secondary | ICD-10-CM

## 2020-03-23 DIAGNOSIS — O99210 Obesity complicating pregnancy, unspecified trimester: Secondary | ICD-10-CM

## 2020-03-23 DIAGNOSIS — Z3A09 9 weeks gestation of pregnancy: Secondary | ICD-10-CM | POA: Diagnosis not present

## 2020-03-23 DIAGNOSIS — I10 Essential (primary) hypertension: Secondary | ICD-10-CM

## 2020-03-23 DIAGNOSIS — Z7982 Long term (current) use of aspirin: Secondary | ICD-10-CM | POA: Insufficient documentation

## 2020-03-23 LAB — URINALYSIS, ROUTINE W REFLEX MICROSCOPIC
Bilirubin Urine: NEGATIVE
Glucose, UA: 50 mg/dL — AB
Ketones, ur: NEGATIVE mg/dL
Nitrite: NEGATIVE
Protein, ur: 30 mg/dL — AB
RBC / HPF: >50 RBC/hpf — ABNORMAL HIGH (ref 0–5)
Specific Gravity, Urine: 1.025 (ref 1.005–1.030)
WBC, UA: >50 WBC/hpf — ABNORMAL HIGH (ref 0–5)
pH: 5 (ref 5.0–8.0)

## 2020-03-23 LAB — CBC/D/PLT+RPR+RH+ABO+RUB AB...
Antibody Screen: NEGATIVE
Basophils Absolute: 0 10*3/uL (ref 0.0–0.2)
Basos: 1 %
EOS (ABSOLUTE): 0.2 10*3/uL (ref 0.0–0.4)
Eos: 3 %
HCV Ab: <0.1 s/co ratio (ref 0.0–0.9)
HIV Screen 4th Generation wRfx: NONREACTIVE
Hematocrit: 33.1 % — ABNORMAL LOW (ref 34.0–46.6)
Hemoglobin: 10.6 g/dL — ABNORMAL LOW (ref 11.1–15.9)
Hepatitis B Surface Ag: NEGATIVE
Immature Grans (Abs): 0 10*3/uL (ref 0.0–0.1)
Immature Granulocytes: 0 %
Lymphocytes Absolute: 2.2 10*3/uL (ref 0.7–3.1)
Lymphs: 30 %
MCH: 25.2 pg — ABNORMAL LOW (ref 26.6–33.0)
MCHC: 32 g/dL (ref 31.5–35.7)
MCV: 79 fL (ref 79–97)
Monocytes Absolute: 0.5 10*3/uL (ref 0.1–0.9)
Monocytes: 6 %
Neutrophils Absolute: 4.5 10*3/uL (ref 1.4–7.0)
Neutrophils: 60 %
Platelets: 249 10*3/uL (ref 150–450)
RBC: 4.21 x10E6/uL (ref 3.77–5.28)
RDW: 13.8 % (ref 11.7–15.4)
RPR Ser Ql: NONREACTIVE
Rh Factor: POSITIVE
Rubella Antibodies, IGG: 3.4 index (ref >0.99)
WBC: 7.4 10*3/uL (ref 3.4–10.8)

## 2020-03-23 LAB — HEMOGLOBIN A1C
Est. average glucose Bld gHb Est-mCnc: 120 mg/dL
Hgb A1c MFr Bld: 5.8 % — ABNORMAL HIGH (ref 4.8–5.6)

## 2020-03-23 LAB — HCV INTERPRETATION

## 2020-03-23 NOTE — MAU Provider Note (Signed)
History     CSN: [id number]  Arrival date and time: 03/23/20 2154   First Provider Initiated Contact with Patient 03/23/20 2327      No chief complaint on file.  Gwendolyn Lynch is a 34 y.o. G6P5005 at [redacted]w[redacted]d  She presents today for vaginal bleeding.  She reports that she had a gush while laying in bed around 8pm.  She reports she went to the bathroom and noticed the bleeding.  She states she continued to note bleeding upon arrival.  She reports sexual activity in the past 3 days. She denies pain urination, but reports discomfort with it was a little cloudy and switched to cranberry juice with resolution of symptoms. Patient reports some abdominal cramping with onset of bleeding that she currently rates 2-3/10.     OB History    Gravida  6   Para  5   Term  5   Preterm  0   AB  0   Living  5     SAB  0   TAB  0   Ectopic  0   Multiple  0   Live Births  5           Past Medical History:  Diagnosis Date  . Anemia   . Hemorrhoids   . Hypertension    not on meds    Past Surgical History:  Procedure Laterality Date  . CESAREAN SECTION     C/S x 3  . CESAREAN SECTION N/A 11/01/2015   Procedure: CESAREAN SECTION;  Surgeon: NCrawford Givens MD;  [facility];  Service: Obstetrics;  Laterality: N/A;  . CESAREAN SECTION N/A 04/22/2017   Procedure: REPEAT CESAREAN SECTION;  Surgeon: NCaren Macadam MD;  [facility]  Service: Obstetrics;  Laterality: N/A;    Family History  Problem Relation Age of Onset  . Diabetes Mother   . Hypertension Mother   . Diabetes Father   . Hypertension Father     Social History   Tobacco Use  . Smoking status: Never Smoker  . Smokeless tobacco: Never Used  Vaping Use  . Vaping Use: Never used  Substance Use Topics  . Alcohol use: No  . Drug use: No    Allergies: No Known Allergies  Medications Prior to Admission  Medication Sig Dispense Refill Last Dose  . scopolamine (TRANSDERM-SCOP, 1.5  MG,) 1 MG/3DAYS Place 1 patch (1.5 mg total) onto the skin every 3 (three) days. 10 patch 12 03/23/2020 at Unknown time  . aspirin EC 81 MG tablet Take 1 tablet (81 mg total) by mouth daily. Start on 04/12/20 30 tablet 11 Unknown at Unknown time  . Blood Pressure Monitoring (BLOOD PRESSURE KIT) DEVI 1 Device by Does not apply route as needed. 1 each ea Unknown at Unknown time  . Doxylamine-Pyridoxine (DICLEGIS) 10-10 MG TBEC Take 2 tablets by mouth at bedtime as needed. 100 tablet 3 Unknown at Unknown time  . Prenatal Vit-Fe Fum-Fe Bisg-FA (NATACHEW) 28-1 MG CHEW Chew 1 tablet by mouth daily. 30 tablet 9 Unknown at Unknown time    Review of Systems  Gastrointestinal: Negative for abdominal pain, nausea and vomiting.  Genitourinary: Positive for vaginal bleeding. Negative for difficulty urinating, dyspareunia, dysuria, pelvic pain and vaginal discharge.  Musculoskeletal: Negative for back pain.  Neurological: Negative for dizziness, light-headedness and headaches.   Physical Exam   Blood pressure 117/75, pulse 84, temperature 98.8 F (37.1 C), temperature source Oral, resp. rate 18, height '4\' 11"'$  (1.499  m), weight 73.9 kg, last menstrual period 12/24/2019, SpO2 99 %, currently breastfeeding.  Physical Exam Vitals and nursing note reviewed. Exam conducted with a chaperone present.  Constitutional:      Appearance: Normal appearance.  HENT:     Head: Normocephalic and atraumatic.  Eyes:     Conjunctiva/sclera: Conjunctivae normal.  Cardiovascular:     Rate and Rhythm: Normal rate and regular rhythm.  Pulmonary:     Effort: Pulmonary effort is normal. No respiratory distress.  Abdominal:     Palpations: Abdomen is soft.     Tenderness: There is no abdominal tenderness.  Genitourinary:    Labia:        Right: No tenderness.        Left: No tenderness.      Vagina: Bleeding present.     Cervix: No cervical motion tenderness, discharge, friability or cervical bleeding.     Comments:  Speculum Exam: -Normal External Genitalia: Sanitary napkin removed with small amt blood noted.  Scant amt bright red blood noted at introitus. -Vaginal Vault: Pink mucosa with good rugae. Small amt blood with 2cm x 1cm clot that appears -wet prep collected -Cervix:Pink, no lesions, cysts, or polyps.  Appears closed. No active bleeding from os-GC/CT collected -Bimanual Exam:  Uterine size difficult to assess d/t abdominal panus.   Musculoskeletal:     Cervical back: Normal range of motion.  Skin:    General: Skin is warm and dry.  Neurological:     Mental Status: She is alert and oriented to person, place, and time.  Psychiatric:        Mood and Affect: Mood normal.        Behavior: Behavior normal.        Thought Content: Thought content normal.     MAU Course  Procedures Results for orders placed or performed during the hospital encounter of 03/23/20 (from the past 24 hour(s))  Urinalysis, Routine w reflex microscopic     Status: Abnormal   Collection Time: 03/23/20 10:34 PM  Result Value Ref Range   Color, Urine YELLOW YELLOW   APPearance HAZY (A) CLEAR   Specific Gravity, Urine 1.025 1.005 - 1.030   pH 5.0 5.0 - 8.0   Glucose, UA 50 (A) NEGATIVE mg/dL   Hgb urine dipstick LARGE (A) NEGATIVE   Bilirubin Urine NEGATIVE NEGATIVE   Ketones, ur NEGATIVE NEGATIVE mg/dL   Protein, ur 30 (A) NEGATIVE mg/dL   Nitrite NEGATIVE NEGATIVE   Leukocytes,Ua LARGE (A) NEGATIVE   RBC / HPF >50 (H) 0 - 5 RBC/hpf   WBC, UA >50 (H) 0 - 5 WBC/hpf   Bacteria, UA RARE (A) NONE SEEN   Squamous Epithelial / LPF 0-5 0 - 5   WBC Clumps PRESENT    Mucus PRESENT   Wet prep, genital     Status: Abnormal   Collection Time: 03/23/20 11:40 PM   Specimen: Vaginal  Result Value Ref Range   Yeast Wet Prep HPF POC NONE SEEN NONE SEEN   Trich, Wet Prep NONE SEEN NONE SEEN   Clue Cells Wet Prep HPF POC NONE SEEN NONE SEEN   WBC, Wet Prep HPF POC FEW (A) NONE SEEN   Sperm NONE SEEN    US OB Comp Less  14 Wks  Result Date: 03/24/2020 CLINICAL DATA:  Pregnant patient in first-trimester pregnancy with vaginal bleeding. EXAM: OBSTETRIC <14 WK ULTRASOUND TECHNIQUE: Transabdominal ultrasound was performed for evaluation of the gestation as well as the maternal uterus and  adnexal regions. COMPARISON:  Obstetric ultrasound 03/09/2020 FINDINGS: Intrauterine gestational sac: Single Yolk sac:  Visualized. Embryo:  Visualized. Cardiac Activity: Visualized. Heart Rate: 164 bpm CRL:   24.6 mm   9 w 1 d                  [place] EDC: 10/25/2020 Subchorionic hemorrhage:  None visualized. Maternal uterus/adnexae: Both ovaries are visualized and are normal. There is ovarian blood flow. No adnexal mass or pelvic free fluid. IMPRESSION: Single live intrauterine pregnancy estimated gestational age [redacted] weeks 1 day for ultrasound [facility] 10/25/2020. There has been interval fetal growth from prior exam. No subchorionic hemorrhage. Electronically Signed   By: Keith Rake M.D.   On: 03/24/2020 00:10    MDM Pelvic Exam with cultures Ultrasound Assessment and Plan  34 year old [id number] Vaginal Bleeding  -Patient offered and declines pain medication.  -Exam performed and findings discussed. -Patient informed that BV noted on last exam, but patient states she was not informed nor received treatment.  -Informed that test repeated today. -Reviewed pelvic exam findings. -Informed that clot was seen and that appears more like fetal tissue than blood. -Will send for [place] to assess fetal viability. -Informed that if findings abnormal will add-on labs. -Reassured that it is normal to have some unknown bleeding in the 1st trimester. -Will await results.   Maryann Conners 03/23/2020, 11:27 PM   Reassessment (12:17 AM) [facility] c/w Dates  -Provider to bedside. -Husband now at bedside.  -Results reviewed. -Patient and husband express relief. -Reiterated normalcy of unknown vaginal bleeding in first trimester. -Discussed pelvic rest x72  hours after completion of all bleeding incidents.  -Reviewed bleeding precautions. -Encouraged to call or return to MAU if symptoms worsen or with the onset of new symptoms. -Discharged to home in stable condition. -Vaginal specimen/clot discarded.   Maryann Conners MSN, CNM Advanced Practice Provider, [facility]

## 2020-03-23 NOTE — MAU Note (Signed)
Pt reports she started gushing light pink blood about 20-30 minutes ago. She is currently wearing a pad that she just put on when she got here. She denies clots or tissue. No pain, vaginal discharge, itching or irritation. She denies recent intercourse. Had a cervical exam yesterday at her doctor's appointment.

## 2020-03-24 DIAGNOSIS — Z3A09 9 weeks gestation of pregnancy: Secondary | ICD-10-CM

## 2020-03-24 DIAGNOSIS — O4691 Antepartum hemorrhage, unspecified, first trimester: Secondary | ICD-10-CM

## 2020-03-24 LAB — CULTURE, OB URINE

## 2020-03-24 LAB — WET PREP, GENITAL
Clue Cells Wet Prep HPF POC: NONE SEEN
Sperm: NONE SEEN
Trich, Wet Prep: NONE SEEN
Yeast Wet Prep HPF POC: NONE SEEN

## 2020-03-24 LAB — URINE CULTURE, OB REFLEX

## 2020-03-24 NOTE — Discharge Instructions (Signed)
Vaginal Bleeding During Pregnancy, First Trimester  A small amount of bleeding from the vagina (spotting) is relatively common during early pregnancy. It usually stops on its own. Various things may cause bleeding or spotting during early pregnancy. Some bleeding may be related to the pregnancy, and some may not. In many cases, the bleeding is normal and is not a problem. However, bleeding can also be a sign of something serious. Be sure to tell your health care provider about any vaginal bleeding right away. Some possible causes of vaginal bleeding during the first trimester include:  Infection or inflammation of the cervix.  Growths (polyps) on the cervix.  Miscarriage or threatened miscarriage.  Pregnancy tissue developing outside of the uterus (ectopic pregnancy).  A mass of tissue developing in the uterus due to an egg being fertilized incorrectly (molar pregnancy). Follow these instructions at home: Activity  Follow instructions from your health care provider about limiting your activity. Ask what activities are safe for you.  If needed, make plans for someone to help with your regular activities.  Do not have sex or orgasms until your health care provider says that this is safe. General instructions  Take over-the-counter and prescription medicines only as told by your health care provider.  Pay attention to any changes in your symptoms.  Do not use tampons or douche.  Write down how many pads you use each day, how often you change pads, and how soaked (saturated) they are.  If you pass any tissue from your vagina, save the tissue so you can show it to your health care provider.  Keep all follow-up visits as told by your health care provider. This is important. Contact a health care provider if:  You have vaginal bleeding during any part of your pregnancy.  You have cramps or labor pains.  You have a fever. Get help right away if:  You have severe cramps in your  back or abdomen.  You pass large clots or a large amount of tissue from your vagina.  Your bleeding increases.  You feel light-headed or weak, or you faint.  You have chills.  You are leaking fluid or have a gush of fluid from your vagina. Summary  A small amount of bleeding (spotting) from the vagina is relatively common during early pregnancy.  Various things may cause bleeding or spotting in early pregnancy.  Be sure to tell your health care provider about any vaginal bleeding right away. This information is not intended to replace advice given to you by your health care provider. Make sure you discuss any questions you have with your health care provider. Document Revised: 12/28/2018 Document Reviewed: 12/11/2016 Elsevier Patient Education  2020 Elsevier Inc.  

## 2020-03-28 ENCOUNTER — Encounter: Payer: Self-pay | Admitting: Obstetrics & Gynecology

## 2020-03-28 ENCOUNTER — Telehealth: Payer: Self-pay

## 2020-03-28 NOTE — Telephone Encounter (Addendum)
Pt call transferred from the front office with c/o of pink tinged with dark brown discharge.  Pt stated that she was told by the MAU provider that her bleeding should stop in about three days from the visit she had but she is still having the vaginal bleeding that she described.  Reviewed pt's c/o with Dr. Debroah Loop who recommended that pt did not need to go to MAU at this time because it seems as though the bleeding is coming to a stop.  Provider recommended that if her bleeding begins like a period or she has some pain to go to MAU.  Informed pt provider's recommendation.  Pt asked if she should continue pelvic rest.  I advised pt that she should continue.  Pt verbalized understanding.    Addison Naegeli, RN

## 2020-03-30 ENCOUNTER — Encounter (HOSPITAL_COMMUNITY): Payer: Self-pay | Admitting: Family Medicine

## 2020-03-30 ENCOUNTER — Inpatient Hospital Stay (HOSPITAL_COMMUNITY)
Admission: AD | Admit: 2020-03-30 | Discharge: 2020-03-30 | Disposition: A | Discharge status: Home / Self Care | Payer: Medicaid Other | Attending: Family Medicine | Admitting: Family Medicine

## 2020-03-30 ENCOUNTER — Other Ambulatory Visit: Payer: Self-pay

## 2020-03-30 DIAGNOSIS — O209 Hemorrhage in early pregnancy, unspecified: Secondary | ICD-10-CM | POA: Diagnosis not present

## 2020-03-30 DIAGNOSIS — O4691 Antepartum hemorrhage, unspecified, first trimester: Secondary | ICD-10-CM

## 2020-03-30 DIAGNOSIS — R109 Unspecified abdominal pain: Secondary | ICD-10-CM | POA: Diagnosis not present

## 2020-03-30 DIAGNOSIS — Z3A10 10 weeks gestation of pregnancy: Secondary | ICD-10-CM | POA: Diagnosis not present

## 2020-03-30 DIAGNOSIS — O26891 Other specified pregnancy related conditions, first trimester: Secondary | ICD-10-CM | POA: Diagnosis not present

## 2020-03-30 DIAGNOSIS — O0941 Supervision of pregnancy with grand multiparity, first trimester: Secondary | ICD-10-CM | POA: Diagnosis not present

## 2020-03-30 DIAGNOSIS — Z3491 Encounter for supervision of normal pregnancy, unspecified, first trimester: Secondary | ICD-10-CM

## 2020-03-30 LAB — CBC
HCT: 32.3 % — ABNORMAL LOW (ref 36.0–46.0)
Hemoglobin: 10.2 g/dL — ABNORMAL LOW (ref 12.0–15.0)
MCH: 25.1 pg — ABNORMAL LOW (ref 26.0–34.0)
MCHC: 31.6 g/dL (ref 30.0–36.0)
MCV: 79.4 fL — ABNORMAL LOW (ref 80.0–100.0)
Platelets: 256 10*3/uL (ref 150–400)
RBC: 4.07 MIL/uL (ref 3.87–5.11)
RDW: 14.4 % (ref 11.5–15.5)
WBC: 8.4 10*3/uL (ref 4.0–10.5)
nRBC: 0 % (ref 0.0–0.2)

## 2020-03-30 LAB — URINALYSIS, ROUTINE W REFLEX MICROSCOPIC
Bilirubin Urine: NEGATIVE
Glucose, UA: NEGATIVE mg/dL
Ketones, ur: NEGATIVE mg/dL
Leukocytes,Ua: NEGATIVE
Nitrite: NEGATIVE
Protein, ur: NEGATIVE mg/dL
Specific Gravity, Urine: 1.024 (ref 1.005–1.030)
pH: 6 (ref 5.0–8.0)

## 2020-03-30 NOTE — MAU Note (Signed)
Pt reports to MAU c/o some mild abdominal cramping accompanied by some "rustic brown bleeding." pt reports she is wearing her second panty liner for the day and has been having some clots that are "like small shards." pt reports the cramping is a 2/10.

## 2020-03-30 NOTE — Discharge Instructions (Signed)

## 2020-03-30 NOTE — MAU Provider Note (Signed)
History     CSN: [id number]  Arrival date and time: 03/30/20 2004   First Provider Initiated Contact with Patient 03/30/20 2038      Chief Complaint  Patient presents with  . Abdominal Pain  . Vaginal Bleeding   HPI Gwendolyn Lynch is a 34 y.o. [id number] at [redacted]w[redacted]d who presents to MAU with chief complaint of abdominal cramping and abnormal vaginal discharge 2/2 vaginal bleeding. These are recurrent problems. Patient was evaluated in MAU for these complaints on 03/23/2020. She states her discharge and spotting have never stopped despite following recommendation for pelvic rest. Her cramping is mild but almost continuous. She has not taken medication or tried other treatments to manage her pain. She states she has not had these symptoms with any of her previous pregnancies and the is very concerned. She is very tearful throughout her time in MAU.  Patient has initiated care with [facility].  OB History    Gravida  6   Para  5   Term  5   Preterm  0   AB  0   Living  5     SAB  0   TAB  0   Ectopic  0   Multiple  0   Live Births  5           Past Medical History:  Diagnosis Date  . Anemia   . Hemorrhoids   . Hypertension    not on meds    Past Surgical History:  Procedure Laterality Date  . CESAREAN SECTION     C/S x 3  . CESAREAN SECTION N/A 11/01/2015   Procedure: CESAREAN SECTION;  Surgeon: Jaymes Graff, MD;  Location: [facility];  Service: Obstetrics;  Laterality: N/A;  . CESAREAN SECTION N/A 04/22/2017   Procedure: REPEAT CESAREAN SECTION;  Surgeon: Federico Flake, MD;  Location: [facility] BIRTHING SUIT[facility];  Service: Obstetrics;  Laterality: N/A;    Family History  Problem Relation Age of Onset  . Diabetes Mother   . Hypertension Mother   . Diabetes Father   . Hypertension Father     Social History   Tobacco Use  . Smoking status: Never Smoker  . Smokeless tobacco: Never Used  Vaping Use  . Vaping Use: Never used  Substance Use  Topics  . Alcohol use: No  . Drug use: No    Allergies: No Known Allergies  No medications prior to admission.    Review of Systems  Gastrointestinal: Positive for abdominal pain.  Genitourinary: Positive for vaginal bleeding and vaginal discharge.  All other systems reviewed and are negative.  Physical Exam   Blood pressure 110/71, pulse 80, temperature 98.1 F (36.7 C), temperature source Oral, resp. rate 16, weight 78.3 kg, last menstrual period 12/24/2019, currently breastfeeding.  Physical Exam Vitals and nursing note reviewed. Exam conducted with a chaperone present.  Constitutional:      Appearance: She is well-developed.  Abdominal:     General: Abdomen is flat.     Palpations: Abdomen is soft.     Tenderness: There is no abdominal tenderness.  Genitourinary:    Comments: Pelvic exam: External genitalia normal, vaginal walls pink and well rugated, cervix visually closed, no lesions noted. Scant pale brown discharge noted near cervical os. Bimanual: cervix 0/thick/posterior, neg CMT, uterus nontender, no adnexal tenderness noted.     Skin:    General: Skin is warm.     Capillary Refill: Capillary refill takes less than 2 seconds.  Neurological:     Mental Status: She is alert.     MAU Course  Procedures  Orders Placed This Encounter  Procedures  . Urinalysis, Routine w reflex microscopic  . CBC   Results for orders placed or performed during the hospital encounter of 03/30/20 (from the past 24 hour(s))  Urinalysis, Routine w reflex microscopic     Status: Abnormal   Collection Time: 03/30/20  8:57 PM  Result Value Ref Range   Color, Urine YELLOW YELLOW   APPearance CLEAR CLEAR   Specific Gravity, Urine 1.024 1.005 - 1.030   pH 6.0 5.0 - 8.0   Glucose, UA NEGATIVE NEGATIVE mg/dL   Hgb urine dipstick MODERATE (A) NEGATIVE   Bilirubin Urine NEGATIVE NEGATIVE   Ketones, ur NEGATIVE NEGATIVE mg/dL   Protein, ur NEGATIVE NEGATIVE mg/dL   Nitrite  NEGATIVE NEGATIVE   Leukocytes,Ua NEGATIVE NEGATIVE   RBC / HPF 11-20 0 - 5 RBC/hpf   WBC, UA 0-5 0 - 5 WBC/hpf   Bacteria, UA RARE (A) NONE SEEN   Squamous Epithelial / LPF 0-5 0 - 5   Mucus PRESENT   CBC     Status: Abnormal   Collection Time: 03/30/20  9:00 PM  Result Value Ref Range   WBC 8.4 4.0 - 10.5 K/uL   RBC 4.07 3.87 - 5.11 MIL/uL   Hemoglobin 10.2 (L) 12.0 - 15.0 g/dL   HCT 28.3 (L) 36 - 46 %   MCV 79.4 (L) 80.0 - 100.0 fL   MCH 25.1 (L) 26.0 - 34.0 pg   MCHC 31.6 30.0 - 36.0 g/dL   RDW 66.2 94.7 - 65.4 %   Platelets 256 150 - 400 K/uL   nRBC 0.0 0.0 - 0.2 %   --IUP confirmed 03/24/2020 --Bedside Ultrasound for FHR check: Patient informed that the ultrasound is considered a limited obstetric ultrasound and is not intended to be a complete ultrasound exam.  Patient also informed that the ultrasound is not being completed with the intent of assessing for fetal or placental anomalies or any pelvic abnormalities.  Explained that the purpose of today's ultrasound is to assess for fetal heart rate.  Patient acknowledges the purpose of the exam and the limitations of the study. --Patient able to heart heart tones during evaluation, reports feeling reassured   --Neglible change in Hgb from baseline  Assessment and Plan  --34 y.o. G6P5005 at [redacted]w[redacted]d  --FHT 155 via BSUS --No bleeding on exam in MAU --O POS --Continue pelvic rest as previously advised --Consider Tylenol PRN for cramping --Discharge home in stable condition  F/U: --As needed with [facility]  Calvert Cantor, CNM 03/30/2020, 9:41 PM

## 2020-04-02 ENCOUNTER — Other Ambulatory Visit: Payer: Self-pay | Admitting: General Practice

## 2020-04-02 DIAGNOSIS — O0990 Supervision of high risk pregnancy, unspecified, unspecified trimester: Secondary | ICD-10-CM

## 2020-04-05 ENCOUNTER — Ambulatory Visit (INDEPENDENT_AMBULATORY_CARE_PROVIDER_SITE_OTHER): Payer: Self-pay | Admitting: Obstetrics and Gynecology

## 2020-04-05 ENCOUNTER — Other Ambulatory Visit: Payer: Medicaid Other

## 2020-04-05 ENCOUNTER — Encounter: Payer: Self-pay | Admitting: Obstetrics and Gynecology

## 2020-04-05 ENCOUNTER — Encounter: Payer: Self-pay | Admitting: Family Medicine

## 2020-04-05 ENCOUNTER — Other Ambulatory Visit: Payer: Self-pay

## 2020-04-05 VITALS — BP 126/78 | HR 87 | Wt 159.8 lb

## 2020-04-05 DIAGNOSIS — R002 Palpitations: Secondary | ICD-10-CM

## 2020-04-05 DIAGNOSIS — O99411 Diseases of the circulatory system complicating pregnancy, first trimester: Secondary | ICD-10-CM

## 2020-04-05 DIAGNOSIS — O99211 Obesity complicating pregnancy, first trimester: Secondary | ICD-10-CM

## 2020-04-05 DIAGNOSIS — E669 Obesity, unspecified: Secondary | ICD-10-CM

## 2020-04-05 DIAGNOSIS — Z3A11 11 weeks gestation of pregnancy: Secondary | ICD-10-CM

## 2020-04-05 DIAGNOSIS — O10911 Unspecified pre-existing hypertension complicating pregnancy, first trimester: Secondary | ICD-10-CM

## 2020-04-05 DIAGNOSIS — O0990 Supervision of high risk pregnancy, unspecified, unspecified trimester: Secondary | ICD-10-CM

## 2020-04-05 DIAGNOSIS — O34219 Maternal care for unspecified type scar from previous cesarean delivery: Secondary | ICD-10-CM

## 2020-04-05 DIAGNOSIS — Z148 Genetic carrier of other disease: Secondary | ICD-10-CM

## 2020-04-05 DIAGNOSIS — Z6832 Body mass index (BMI) 32.0-32.9, adult: Secondary | ICD-10-CM

## 2020-04-05 LAB — POCT URINALYSIS DIP (DEVICE)
Bilirubin Urine: NEGATIVE
Glucose, UA: NEGATIVE mg/dL
Ketones, ur: NEGATIVE mg/dL
Leukocytes,Ua: NEGATIVE
Nitrite: NEGATIVE
Protein, ur: NEGATIVE mg/dL
Specific Gravity, Urine: >=1.03 (ref 1.005–1.030)
Urobilinogen, UA: 0.2 mg/dL (ref 0.0–1.0)
pH: 5.5 (ref 5.0–8.0)

## 2020-04-05 NOTE — Patient Instructions (Signed)
First Trimester of Pregnancy  The first trimester of pregnancy is from week 1 until the end of week 13 (months 1 through 3). During this time, your baby will begin to develop inside you. At 6-8 weeks, the eyes and face are formed, and the heartbeat can be seen on ultrasound. At the end of 12 weeks, all the baby's organs are formed. Prenatal care is all the medical care you receive before the birth of your baby. Make sure you get good prenatal care and follow all of your doctor's instructions. Follow these instructions at home: Medicines  Take over-the-counter and prescription medicines only as told by your doctor. Some medicines are safe and some medicines are not safe during pregnancy.  Take a prenatal vitamin that contains at least 600 micrograms (mcg) of folic acid.  If you have trouble pooping (constipation), take medicine that will make your stool soft (stool softener) if your doctor approves. Eating and drinking   Eat regular, healthy meals.  Your doctor will tell you the amount of weight gain that is right for you.  Avoid raw meat and uncooked cheese.  If you feel sick to your stomach (nauseous) or throw up (vomit): ? Eat 4 or 5 small meals a day instead of 3 large meals. ? Try eating a few soda crackers. ? Drink liquids between meals instead of during meals.  To prevent constipation: ? Eat foods that are high in fiber, like fresh fruits and vegetables, whole grains, and beans. ? Drink enough fluids to keep your pee (urine) clear or pale yellow. Activity  Exercise only as told by your doctor. Stop exercising if you have cramps or pain in your lower belly (abdomen) or low back.  Do not exercise if it is too hot, too humid, or if you are in a place of great height (high altitude).  Try to avoid standing for long periods of time. Move your legs often if you must stand in one place for a long time.  Avoid heavy lifting.  Wear low-heeled shoes. Sit and stand up  straight.  You can have sex unless your doctor tells you not to. Relieving pain and discomfort  Wear a good support bra if your breasts are sore.  Take warm water baths (sitz baths) to soothe pain or discomfort caused by hemorrhoids. Use hemorrhoid cream if your doctor says it is okay.  Rest with your legs raised if you have leg cramps or low back pain.  If you have puffy, bulging veins (varicose veins) in your legs: ? Wear support hose or compression stockings as told by your doctor. ? Raise (elevate) your feet for 15 minutes, 3-4 times a day. ? Limit salt in your food. Prenatal care  Schedule your prenatal visits by the twelfth week of pregnancy.  Write down your questions. Take them to your prenatal visits.  Keep all your prenatal visits as told by your doctor. This is important. Safety  Wear your seat belt at all times when driving.  Make a list of emergency phone numbers. The list should include numbers for family, friends, the hospital, and police and fire departments. General instructions  Ask your doctor for a referral to a local prenatal class. Begin classes no later than at the start of month 6 of your pregnancy.  Ask for help if you need counseling or if you need help with nutrition. Your doctor can give you advice or tell you where to go for help.  Do not use hot tubs, steam   rooms, or saunas.  Do not douche or use tampons or scented sanitary pads.  Do not cross your legs for long periods of time.  Avoid all herbs and alcohol. Avoid drugs that are not approved by your doctor.  Do not use any tobacco products, including cigarettes, chewing tobacco, and electronic cigarettes. If you need help quitting, ask your doctor. You may get counseling or other support to help you quit.  Avoid cat litter boxes and soil used by cats. These carry germs that can cause birth defects in the baby and can cause a loss of your baby (miscarriage) or stillbirth.  Visit your dentist.  At home, brush your teeth with a soft toothbrush. Be gentle when you floss. Contact a doctor if:  You are dizzy.  You have mild cramps or pressure in your lower belly.  You have a nagging pain in your belly area.  You continue to feel sick to your stomach, you throw up, or you have watery poop (diarrhea).  You have a bad smelling fluid coming from your vagina.  You have pain when you pee (urinate).  You have increased puffiness (swelling) in your face, hands, legs, or ankles. Get help right away if:  You have a fever.  You are leaking fluid from your vagina.  You have spotting or bleeding from your vagina.  You have very bad belly cramping or pain.  You gain or lose weight rapidly.  You throw up blood. It may look like coffee grounds.  You are around people who have German measles, fifth disease, or chickenpox.  You have a very bad headache.  You have shortness of breath.  You have any kind of trauma, such as from a fall or a car accident. Summary  The first trimester of pregnancy is from week 1 until the end of week 13 (months 1 through 3).  To take care of yourself and your unborn baby, you will need to eat healthy meals, take medicines only if your doctor tells you to do so, and do activities that are safe for you and your baby.  Keep all follow-up visits as told by your doctor. This is important as your doctor will have to ensure that your baby is healthy and growing well. This information is not intended to replace advice given to you by your health care provider. Make sure you discuss any questions you have with your health care provider. Document Revised: 12/30/2018 Document Reviewed: 09/16/2016 Elsevier Patient Education  2020 [employer] Inc.  

## 2020-04-05 NOTE — Progress Notes (Signed)
° °  PRENATAL VISIT NOTE  Subjective:  Gwendolyn Lynch is a 34 y.o. G6P5005 at [redacted]w[redacted]d being seen today for ongoing prenatal care.  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  Patient doing well with no acute concerns today. She reports intermittent feelings of being lightheaded and nearly passing out..  Contractions: Not present. Vag. Bleeding: None.  Movement: Absent. Denies leaking of fluid.   Pt notes episodes of feeling light headed or near syncopal, usually when she is in extreme hot temperatures.  However, she also notes these occurrences while at home and in air conditioning.  She states feeling like her heart is racing  At times.  The following portions of the patient's history were reviewed and updated as appropriate: allergies, current medications, past family history, past medical history, past social history, past surgical history and problem list. Problem list updated.  Objective:   Vitals:   04/05/20 1331  BP: 126/78  Pulse: 87  Weight: 159 lb 12.8 oz (72.5 kg)    Fetal Status:     Movement: Absent     General:  Alert, oriented and cooperative. Patient is in no acute distress.  Skin: Skin is warm and dry. No rash noted.   Cardiovascular: Normal heart rate noted  Respiratory: Normal respiratory effort, no problems with respiration noted  Abdomen: Soft, gravid, appropriate for gestational age.  Pain/Pressure: Present     Pelvic: Cervical exam deferred        Extremities: Normal range of motion.  Edema: None  Mental Status:  Normal mood and affect. Normal behavior. Normal judgment and thought content.    Fetal movement noted with bedside u/s.  Fetus was very active.  Assessment and Plan:  Pregnancy: G6P5005 at [redacted]w[redacted]d  1.  Obesity affecting pregnancy in first trimester   2. Supervision of high risk pregnancy, antepartum Pt notes she will begin taking baby ASA now that she understands why she should be taking it.  3. BMI 32.0-32.9,adult   4. Palpitations with regular cardiac rhythm  - Ambulatory referral to Cardiology  5. History of cesarean delivery, currently pregnant Continue care, discuss BTL at future visit  Preterm labor symptoms and general obstetric precautions including but not limited to vaginal bleeding, contractions, leaking of fluid and fetal movement were reviewed in detail with the patient.  Please refer to After Visit Summary for other counseling recommendations.   Return in about 4 weeks (around 05/03/2020) for [facility], in person.   Mariel Aloe, MD

## 2020-04-11 NOTE — Progress Notes (Signed)
Patient ID: Gwendolyn Lynch, female   DOB: 1985/11/27, 34 y.o.   MRN: [id number] Patient was assessed and managed by nursing staff during this encounter. I have reviewed the chart and agree with the documentation and plan. I have also made any necessary editorial changes.  Scheryl Darter, MD 04/11/2020 1:36 PM

## 2020-04-20 ENCOUNTER — Encounter (HOSPITAL_COMMUNITY): Payer: Self-pay | Admitting: Obstetrics and Gynecology

## 2020-04-20 ENCOUNTER — Inpatient Hospital Stay (HOSPITAL_COMMUNITY)
Admission: AD | Admit: 2020-04-20 | Discharge: 2020-04-20 | Disposition: A | Discharge status: Home / Self Care | Payer: Medicaid Other | Attending: Obstetrics and Gynecology | Admitting: Obstetrics and Gynecology

## 2020-04-20 DIAGNOSIS — O10911 Unspecified pre-existing hypertension complicating pregnancy, first trimester: Secondary | ICD-10-CM | POA: Insufficient documentation

## 2020-04-20 DIAGNOSIS — O0941 Supervision of pregnancy with grand multiparity, first trimester: Secondary | ICD-10-CM | POA: Diagnosis not present

## 2020-04-20 DIAGNOSIS — O4691 Antepartum hemorrhage, unspecified, first trimester: Secondary | ICD-10-CM

## 2020-04-20 DIAGNOSIS — O26891 Other specified pregnancy related conditions, first trimester: Secondary | ICD-10-CM | POA: Insufficient documentation

## 2020-04-20 DIAGNOSIS — O34212 Maternal care for vertical scar from previous cesarean delivery: Secondary | ICD-10-CM | POA: Insufficient documentation

## 2020-04-20 DIAGNOSIS — R109 Unspecified abdominal pain: Secondary | ICD-10-CM | POA: Insufficient documentation

## 2020-04-20 DIAGNOSIS — O209 Hemorrhage in early pregnancy, unspecified: Secondary | ICD-10-CM | POA: Insufficient documentation

## 2020-04-20 DIAGNOSIS — Z3491 Encounter for supervision of normal pregnancy, unspecified, first trimester: Secondary | ICD-10-CM

## 2020-04-20 DIAGNOSIS — O4690 Antepartum hemorrhage, unspecified, unspecified trimester: Secondary | ICD-10-CM

## 2020-04-20 DIAGNOSIS — Z3A13 13 weeks gestation of pregnancy: Secondary | ICD-10-CM | POA: Insufficient documentation

## 2020-04-20 LAB — URINALYSIS, ROUTINE W REFLEX MICROSCOPIC
Bacteria, UA: NONE SEEN
Bilirubin Urine: NEGATIVE
Glucose, UA: 50 mg/dL — AB
Ketones, ur: NEGATIVE mg/dL
Leukocytes,Ua: NEGATIVE
Nitrite: NEGATIVE
Protein, ur: NEGATIVE mg/dL
Specific Gravity, Urine: 1.021 (ref 1.005–1.030)
pH: 5 (ref 5.0–8.0)

## 2020-04-20 NOTE — MAU Provider Note (Signed)
History     CSN: [id number]  Arrival date and time: 04/20/20 1104   First Provider Initiated Contact with Patient 04/20/20 1138      Chief Complaint  Patient presents with  . Vaginal Bleeding  . Abdominal Pain   HPI   Ms.Gwendolyn Lynch is a 34 y.o. female [phone] @ 76w1dwith a history of 4 previous C-sections here with recurrent vaginal bleeding. The bleeding has been of and on for a few weeks. She reports passing a clot 3 days ago. The bleeding waxes and wanes from bright red, to pink, to brown in color.  No intercourse recently.  States she has been seen in MAU for this in the past.  States she has started feeling her baby move.   OB History    Gravida  6   Para  5   Term  5   Preterm  0   AB  0   Living  5     SAB  0   TAB  0   Ectopic  0   Multiple  0   Live Births  5           Past Medical History:  Diagnosis Date  . Anemia   . Hemorrhoids   . Hypertension    not on meds    Past Surgical History:  Procedure Laterality Date  . CESAREAN SECTION     C/S x 3  . CESAREAN SECTION N/A 11/01/2015   Procedure: CESAREAN SECTION;  Surgeon: NCrawford Givens MD;  [facility];  Service: Obstetrics;  Laterality: N/A;  . CESAREAN SECTION N/A 04/22/2017   Procedure: REPEAT CESAREAN SECTION;  Surgeon: NCaren Macadam MD;  [facility]  Service: Obstetrics;  Laterality: N/A;    Family History  Problem Relation Age of Onset  . Diabetes Mother   . Hypertension Mother   . Diabetes Father   . Hypertension Father     Social History   Tobacco Use  . Smoking status: Never Smoker  . Smokeless tobacco: Never Used  Vaping Use  . Vaping Use: Never used  Substance Use Topics  . Alcohol use: No  . Drug use: No    Allergies: No Known Allergies  Medications Prior to Admission  Medication Sig Dispense Refill Last Dose  . aspirin EC 81 MG tablet Take 1 tablet (81 mg total) by mouth daily. Start on 04/12/20 (Patient not taking:  Reported on 04/05/2020) 30 tablet 11   . Blood Pressure Monitoring (BLOOD PRESSURE KIT) DEVI 1 Device by Does not apply route as needed. 1 each ea   . Doxylamine-Pyridoxine (DICLEGIS) 10-10 MG TBEC Take 2 tablets by mouth at bedtime as needed. (Patient not taking: Reported on 04/05/2020) 100 tablet 3   . Prenatal Vit-Fe Fum-Fe Bisg-FA (NATACHEW) 28-1 MG CHEW Chew 1 tablet by mouth daily. 30 tablet 9   . scopolamine (TRANSDERM-SCOP, 1.5 MG,) 1 MG/3DAYS Place 1 patch (1.5 mg total) onto the skin every 3 (three) days. (Patient not taking: Reported on 04/05/2020) 10 patch 12    Results for orders placed or performed during the hospital encounter of 04/20/20 (from the past 48 hour(s))  Urinalysis, Routine w reflex microscopic     Status: Abnormal   Collection Time: 04/20/20 11:44 AM  Result Value Ref Range   Color, Urine YELLOW YELLOW   APPearance CLEAR CLEAR   Specific Gravity, Urine 1.021 1.005 - 1.030   pH 5.0 5.0 - 8.0   Glucose, UA 50 (  A) NEGATIVE mg/dL   Hgb urine dipstick LARGE (A) NEGATIVE   Bilirubin Urine NEGATIVE NEGATIVE   Ketones, ur NEGATIVE NEGATIVE mg/dL   Protein, ur NEGATIVE NEGATIVE mg/dL   Nitrite NEGATIVE NEGATIVE   Leukocytes,Ua NEGATIVE NEGATIVE   RBC / HPF 0-5 0 - 5 RBC/hpf   WBC, UA 0-5 0 - 5 WBC/hpf   Bacteria, UA NONE SEEN NONE SEEN   Squamous Epithelial / LPF 0-5 0 - 5   Mucus PRESENT     Comment: Performed at [facility], [place] [address], [place],  [address]   Review of Systems  Constitutional: Negative for fever.  Gastrointestinal: Positive for abdominal pain.  Genitourinary: Positive for vaginal bleeding. Negative for dysuria and vaginal discharge.   Physical Exam   Blood pressure 124/67, pulse 104, temperature 98.4 F (36.9 C), resp. rate 16, weight 74 kg, last menstrual period 12/24/2019, SpO2 99 %, currently breastfeeding.  Physical Exam Constitutional:      Appearance: She is well-developed.  Abdominal:     Tenderness: There is no  abdominal tenderness.  Genitourinary:    Vagina: Vaginal discharge present.     Cervix: Discharge present.     Comments: Vagina - Small amount of brown vaginal discharge, no odor, no active bleeding  Cervix - No contact bleeding, no active bleeding. Cervix closed.  Bimanual exam: Cervix closed, thick, posterior  Chaperone present for exam.  Neurological:     Mental Status: She is alert.    MAU Course  Procedures  None  MDM  + fetal heart tones via doppler O positive blood type  Pelvic exam without active bleeding. Recent US shows no subchorionic hemorrhage. No source of bleeding/ discharge found. Recent negative vaginal cultures.   Assessment and Plan   A:  1. Vaginal bleeding in pregnancy   2. [redacted] weeks gestation of pregnancy   3. Presence of fetal heart sounds in first trimester     P:  Discharge home in stable condition  Return to MAU if symptoms worsen F/u with OB Pelvic rest   Sarabella Caprio, Artist Pais, NP 04/20/2020 4:22 PM

## 2020-04-20 NOTE — MAU Note (Signed)
.   Gwendolyn Lynch is a 34 y.o. at [redacted]w[redacted]d here in MAU reporting: she continues to having vaginal bleeding when she wipes. Mild pain in her left lower abdomen.  LMP: 12/24/19 Onset of complaint: ongoing Pain score: 2 Vitals:   04/20/20 1132  BP: 124/67  Pulse: 104  Resp: 16  Temp: 98.4 F (36.9 C)  SpO2: 99%     FHT:141 Lab orders placed from triage: UA

## 2020-04-20 NOTE — Discharge Instructions (Signed)

## 2020-04-30 ENCOUNTER — Encounter: Payer: Self-pay | Admitting: *Deleted

## 2020-05-03 ENCOUNTER — Encounter: Payer: Self-pay | Admitting: Family Medicine

## 2020-05-03 ENCOUNTER — Other Ambulatory Visit: Payer: Self-pay

## 2020-05-03 ENCOUNTER — Ambulatory Visit (INDEPENDENT_AMBULATORY_CARE_PROVIDER_SITE_OTHER): Payer: Medicaid Other | Admitting: Family Medicine

## 2020-05-03 VITALS — BP 118/69 | HR 89 | Wt 162.0 lb

## 2020-05-03 DIAGNOSIS — O34219 Maternal care for unspecified type scar from previous cesarean delivery: Secondary | ICD-10-CM

## 2020-05-03 DIAGNOSIS — I10 Essential (primary) hypertension: Secondary | ICD-10-CM

## 2020-05-03 DIAGNOSIS — O99212 Obesity complicating pregnancy, second trimester: Secondary | ICD-10-CM

## 2020-05-03 DIAGNOSIS — O4690 Antepartum hemorrhage, unspecified, unspecified trimester: Secondary | ICD-10-CM

## 2020-05-03 DIAGNOSIS — O169 Unspecified maternal hypertension, unspecified trimester: Secondary | ICD-10-CM

## 2020-05-03 DIAGNOSIS — Z3A15 15 weeks gestation of pregnancy: Secondary | ICD-10-CM

## 2020-05-03 DIAGNOSIS — O0990 Supervision of high risk pregnancy, unspecified, unspecified trimester: Secondary | ICD-10-CM

## 2020-05-03 NOTE — Progress Notes (Signed)
° °  PRENATAL VISIT NOTE  Subjective:  Gwendolyn Lynch is a 34 y.o. G6P5005 at [redacted]w[redacted]d being seen today for ongoing prenatal care.  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  Patient reports no complaints.  Contractions: Not present. Vag. Bleeding: Scant.  Movement: Absent. Denies leaking of fluid.   The following portions of the patient's history were reviewed and updated as appropriate: allergies, current medications, past family history, past medical history, past social history, past surgical history and problem list.   Objective:   Vitals:   05/03/20 1427  BP: 118/69  Pulse: 89  Weight: 162 lb (73.5 kg)    Fetal Status: Fetal Heart Rate (bpm): 143   Movement: Absent     General:  Alert, oriented and cooperative. Patient is in no acute distress.  Skin: Skin is warm and dry. No rash noted.   Cardiovascular: Normal heart rate noted  Respiratory: Normal respiratory effort, no problems with respiration noted  Abdomen: Soft, gravid, appropriate for gestational age.  Pain/Pressure: Present     Pelvic: Cervical exam deferred        Extremities: Normal range of motion.  Edema: Trace  Mental Status: Normal mood and affect. Normal behavior. Normal judgment and thought content.   Assessment and Plan:  Pregnancy: G6P5005 at [redacted]w[redacted]d  1. Supervision of high risk pregnancy, antepartum Reviewed UA results as patient had many questions Reviewed contraception plan FMLA paperwork reviewed- paperwork only says 6 wks pp but patient will have CS and should have 8 weeks of medical leave with additional 4 weeks of infant bonding.  Anatomy scan scheduled  2. Obesity affecting pregnancy in second  trimester TWG= -4 lb (-1.814 kg)   3. History of cesarean delivery, currently pregnant CSx4, plan for repeat @39  weeks Discussed reproductive life planning today- desires 1 more child after this Counseled about high risk of repeat CS and recommendation for 13mon-2 years at least between pregnancies. Discussed high risk of placental anomalies with repeat CS  4. Hypertension, unspecified type  5. Hypertension affecting pregnancy, antepartum BP WNL Taking ASA  6. Vaginal bleeding - Has had continued daily bleeding- low amt but present. Last 13mo without [facility]  - Reviewed concern for placental location/abnormality - Has [facility] scheduled 9/9  Preterm labor symptoms and general obstetric precautions including but not limited to vaginal bleeding, contractions, leaking of fluid and fetal movement were reviewed in detail with the patient. Please refer to After Visit Summary for other counseling recommendations.   Return in about 4 weeks (around 05/31/2020) for Routine prenatal care.  Future Appointments  Date Time Provider Department Center  05/17/2020  2:20 PM 05/19/2020, MD [facility]  05/31/2020  9:00 AM [facility] NURSE [facility]  05/31/2020  9:00 AM [facility] US1 [facility]  05/31/2020 10:55 AM 07/31/2020, MD [facility]    [facility] MD

## 2020-05-03 NOTE — Patient Instructions (Signed)
High blood pressure reading is 140/90.

## 2020-05-08 MED FILL — Lactated Ringer's Solution: INTRAVENOUS | Qty: 1000 | Status: AC

## 2020-05-16 ENCOUNTER — Encounter: Payer: Self-pay | Admitting: Obstetrics and Gynecology

## 2020-05-17 ENCOUNTER — Telehealth: Payer: Self-pay | Admitting: Radiology

## 2020-05-17 ENCOUNTER — Other Ambulatory Visit: Payer: Self-pay

## 2020-05-17 ENCOUNTER — Encounter: Payer: Self-pay | Admitting: Cardiology

## 2020-05-17 ENCOUNTER — Ambulatory Visit (INDEPENDENT_AMBULATORY_CARE_PROVIDER_SITE_OTHER): Payer: Self-pay | Admitting: Cardiology

## 2020-05-17 VITALS — BP 120/72 | HR 99 | Ht 59.0 in | Wt 167.6 lb

## 2020-05-17 DIAGNOSIS — R55 Syncope and collapse: Secondary | ICD-10-CM | POA: Insufficient documentation

## 2020-05-17 DIAGNOSIS — O169 Unspecified maternal hypertension, unspecified trimester: Secondary | ICD-10-CM

## 2020-05-17 DIAGNOSIS — O210 Mild hyperemesis gravidarum: Secondary | ICD-10-CM | POA: Insufficient documentation

## 2020-05-17 DIAGNOSIS — R000 Tachycardia, unspecified: Secondary | ICD-10-CM

## 2020-05-17 NOTE — Telephone Encounter (Signed)
Enrolled patient for a 7 day Zio XT monitor to be mailed to patients home.  

## 2020-05-17 NOTE — Patient Instructions (Signed)
Medication Instructions:  No changes *If you need a refill on your cardiac medications before your next appointment, please call your pharmacy*   Lab Work: Not needed   Testing/Procedures: Will be schedule at [address] Your physician has requested that you have an echocardiogram. Echocardiography is a painless test that uses sound waves to create images of your heart. It provides your doctor with information about the size and shape of your heart and how well your heart's chambers and valves are working. This procedure takes approximately one hour. There are no restrictions for this procedure.  And  Your physician has recommended that you wear a 7 DAY ZIO-PATCH monitor. The Zio patch cardiac monitor continuously records heart rhythm data for up to 14 days, this is for patients being evaluated for multiple types heart rhythms. For the first 24 hours post application, please avoid getting the Zio monitor wet in the shower or by excessive sweating during exercise. After that, feel free to carry on with regular activities. Keep soaps and lotions away from the ZIO XT Patch.  This will be mailed to you, please expect 7-10 days to receive.        Follow-Up: At [facility], you and your health needs are our priority.  As part of our continuing mission to provide you with exceptional heart care, we have created designated Provider Care Teams.  These Care Teams include your primary Cardiologist (physician) and Advanced Practice Providers (APPs -  Physician Assistants and Nurse Practitioners) who all work together to provide you with the care you need, when you need it.  We recommend signing up for the patient portal called "MyChart".  Sign up information is provided on this After Visit Summary.  MyChart is used to connect with patients for Virtual Visits (Telemedicine).  Patients are able to view lab/test results, encounter notes, upcoming appointments, etc.  Non-urgent  messages can be sent to your provider as well.   To learn more about what you can do with MyChart, go to [email or web address].    Your next appointment:   5 week(s)  The format for your next appointment:   In Person  Provider:   Bryan Lemma, MD   Other Instructions  stay out of work until results are back and evaluated by Dr Herbie Baltimore.  ZIO XT- Long Term Monitor Instructions   Your physician has requested you wear your ZIO patch monitor____7___days.   This is a single patch monitor.  [employer] supplies one patch monitor per enrollment.  Additional stickers are not available.   Please do not apply patch if you will be having a Nuclear Stress Test, Echocardiogram, Cardiac CT, MRI, or Chest Xray during the time frame you would be wearing the monitor. The patch cannot be worn during these tests.  You cannot remove and re-apply the ZIO XT patch monitor.   Your ZIO patch monitor will be sent USPS Priority mail from [facility] directly to your home address. The monitor may also be mailed to a PO BOX if home delivery is not available.   It may take 3-5 days to receive your monitor after you have been enrolled.   Once you have received you monitor, please review enclosed instructions.  Your monitor has already been registered assigning a specific monitor serial # to you.   Applying the monitor   Shave hair from upper left chest.   Hold abrader disc by orange tab.  Rub abrader in 40 strokes over left upper chest as  indicated in your monitor instructions.   Clean area with 4 enclosed alcohol pads .  Use all pads to assure are is cleaned thoroughly.  Let dry.   Apply patch as indicated in monitor instructions.  Patch will be place under collarbone on left side of chest with arrow pointing upward.   Rub patch adhesive wings for 2 minutes.Remove white label marked "1".  Remove white label marked "2".  Rub patch adhesive wings for 2 additional minutes.   While looking in a mirror,  press and release button in center of patch.  A small green light will flash 3-4 times .  This will be your only indicator the monitor has been turned on.     Do not shower for the first 24 hours.  You may shower after the first 24 hours.   Press button if you feel a symptom. You will hear a small click.  Record Date, Time and Symptom in the Patient Log Book.   When you are ready to remove patch, follow instructions on last 2 pages of Patient Log Book.  Stick patch monitor onto last page of Patient Log Book.   Place Patient Log Book in [place] box.  Use locking tab on box and tape box closed securely.  The [employer] and [employer] has [employer] on it.  Please place in mailbox as soon as possible.  Your physician should have your test results approximately 7 days after the monitor has been mailed back to [facility].   Call [facility] Customer Care at [phone] if you have questions regarding your ZIO XT patch monitor.  Call them immediately if you see an orange light blinking on your monitor.   If your monitor falls off in less than 4 days contact our Monitor department at [phone].  If your monitor becomes loose or falls off after 4 days call [employer] at [phone] for suggestions on securing your monitor.

## 2020-05-17 NOTE — Progress Notes (Signed)
Primary Care Provider: Patient, No Pcp Per Cardiologist: Glenetta Hew, MD Electrophysiologist: None  Obstetrics/Gynecology: Griffin Basil, MD; Caren Macadam, MD  Clinic Note: Chief Complaint  Patient presents with  . New Patient (Initial Visit)    ([redacted] weeks pregnant) heart racing, near syncope  . Tachycardia  . Near Syncope   HPI:    Gwendolyn Lynch is a 34 y.o. 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  Recent Hospitalizations/Clinic Visits:   March [facility], [facility]: 3 days of vomiting.  Not able to keep food down.  No sick contact. ->  Thought to be related to gastroenteritis.  Treated with Zofran and given as needed Zofran.  January 02, 2020: General gynecologic exam noted prolonged menses lasting 13 as opposed to 5 days.  And gained 20 pounds in 20 weeks due to pandemic issues. -Indicated desire to have 1 more child before turning 12.  March 01, 2020: Positive pregnancy test.  LMP 12/24/2019 --> EDD 09/29/2020  June 17-18, 2021: At [redacted] weeks pregnant presented with nausea vomiting for 3 days.  Unable to hold things down.  Still trying to eat and drink small amounts  March 21, 2020: Presented to the emergency room at [facility] with "morning sickness ".  Have been off and on nausea throughout the entire pregnancy.  Was try to use scopolamine patch but had minimal relief.  She noted difficulty eating and drinking over the past 3 days.  Hard to keep things down.  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  Felt too weak while doing shift and called EMS.  Upon arrival there is concern for heat exposure and she was given IV fluids and Zofran.  She felt cramping but denies any vaginal bleeding.  No  fevers or chills.  Was treated with IV fluids (2 L) and antiemetics.  Other work-up was unremarkable.  Discharged with diagnosis of hyperemesis gravidarum  July 2-3, 2021: [facility]) presented with vaginal bleeding.  Felt a gush swelling in bed.  Went to bathroom and noticed some bleeding.  She did report preceding sexual activity.  -->  Sent for ultrasound to assess fetal viability.  March 30, 2020: [place]: Again abdominal cramping and vaginal discharge secondary to bleeding.  Noted to be recurrent.  Noticed spotting never stopped following the earlier evaluation.  And not tried or taken medications.  Had not had these symptoms with previous pregnancies. ->  Bedside ultrasound performed to determine fetal heart rate.  Stable fetal heart tones noted.  Notable change in hemoglobin.    Continue to recommend pelvic rest.  Dwayne Begay Murata was last seen on April 05, 2020 by Dr. Elgie Congo: Noted intermittent episodes of lightheadedness with near syncope.  No further vaginal bleeding.  The symptoms of lightheadedness occur and extreme hot temperatures.  But can also occur at home and her conditioning.  She feels her heart racing. -->  Referred for cardiology evaluation.   April 20, 2020-[place]: Again off-and-on bleeding for few weeks.  Passed a clot 3 days prior to presentation.  Waxing and waning of bleeding.  Has noted baby moving. ->  Again recommended pelvic rest.  Reviewed  CV studies:    The following studies were reviewed today: (if available, images/films reviewed: From Epic Chart or Care Everywhere) . EGD from 03/22/2020 showed sinus rhythm, rate 77 bpm.  Nonspecific ST and T wave changes.  Otherwise normal.   Interval History:   Gwendolyn Lynch is here today for cardiology visit to discuss her tachycardia and lightheadedness.  She has not had true syncope.  She states that this all time began back on June 30 which was the episode when she went to the emergency room  for near syncope.  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  She had almost finished the first half of the day, and noted that it was extremely hot and she began feeling very lightheaded and fatigued.  She is very concerned because she was not sweating.  She felt hot and flushed with her heart racing and she began to feel short of breath.  Had a headache but never lost consciousness.  She called 911 after calling her work. She initially felt much better after the IV hydration and antiemetics.  However symptoms have been recurrent off and on since then.   Really dating back to the her earlier ER evaluations with significant nausea and vomiting, she really has not been able to keep anything down over the last month and a half.  Interestingly none of this is noted in the multiple OB evaluations for nausea and vaginal bleeding.  She tells me that she is really not able to keep much down without getting nauseated.  She is able to nipple on food here and there but not really able to eat a major meal.  Most of her episodes where she feels lightheaded and dizzy with her heart races when she is out in the heat.  She had to call out of work because of the symptoms on frequent occasions and is concerned about working during the heat of the summer until her symptoms improve.  She is very concerned about the fast heart rates but more concerned about the lack of sweating.  She has never had these issues with previous pregnancies and is very concerned.  She also notes that her urine is usually somewhat dark and she does not have frequent urination.  She says that with most activities her heart rate gets up really fast.  However, she really does not get the lightheadedness and dizziness unless it gets "super fast" and she gets short of breath as well.  She is not having chest pain associated with it.  She has not had any true syncope but has clearly had near syncope.  CV Review of Symptoms  (Summary) Cardiovascular ROS: positive for - dyspnea on exertion, palpitations, rapid heart rate and Near syncope with extreme lightheadedness.  Fatigue and weakness. negative for - chest pain, edema, loss of consciousness, orthopnea, paroxysmal nocturnal dyspnea, shortness of breath or TIA or amaurosis fugax.  Claudication  The patient does not have symptoms concerning for COVID-19 infection (fever, chills, cough, or new shortness of breath).  The patient is practicing social distancing & Masking.   Is concerned about COVID-19 vaccine with pregnancy. Immunization History  Administered Date(s) Administered  . Influenza,inj,Quad PF,6+ Mos 11/25/2016  . Tdap 02/10/2017    REVIEWED OF SYSTEMS   Review of Systems  Constitutional: Positive for malaise/fatigue. Negative for weight loss (She has gained weight with pregnancy, but not as much as usual.).  HENT: Negative for congestion and nosebleeds.   Respiratory: Negative for  cough and shortness of breath.   Cardiovascular: Positive for palpitations.  Gastrointestinal: Positive for nausea and vomiting. Negative for abdominal pain, blood in stool, constipation, diarrhea and melena.       Generally reduced p.o. intake.  Not able to get in much food.  She is trying to make sure she drinks, but not eating.  Genitourinary: Positive for dysuria. Negative for frequency.       Intermittent cramping with spotting  Musculoskeletal: Negative.   Neurological: Positive for dizziness and weakness (Generalized weakness when she feels his heart rate spells go fast.). Negative for loss of consciousness.  Psychiatric/Behavioral: Negative for depression. The patient is nervous/anxious. The patient does not have insomnia.      I have reviewed and (if needed) personally updated the patient's problem list, medications, allergies, past medical and surgical history, social and family history.   PAST MEDICAL HISTORY   Past Medical History:  Diagnosis Date  .  Anemia   . Hemorrhoids   . Hyperemesis gravidarum   . Hypertension affecting pregnancy    Has had hypertension during previous pregnancies  . Thalassemia carrier     Per report is listed as thalassemia carrier (patient not aware)  PAST SURGICAL HISTORY   Past Surgical History:  Procedure Laterality Date  . CESAREAN SECTION     C/S x 3  . CESAREAN SECTION N/A 11/01/2015   Procedure: CESAREAN SECTION;  Surgeon: Crawford Givens, MD;  Location: [facility];  Service: Obstetrics;  Laterality: N/A;  . CESAREAN SECTION N/A 04/22/2017   Procedure: REPEAT CESAREAN SECTION;  Surgeon: Caren Macadam, MD;  Location: [facility];  Service: Obstetrics;  Laterality: N/A;    MEDICATIONS/ALLERGIES   Current Meds  Medication Sig  . aspirin EC 81 MG tablet Take 1 tablet (81 mg total) by mouth daily. Start on 04/12/20  . Blood Pressure Monitoring (BLOOD PRESSURE KIT) DEVI 1 Device by Does not apply route as needed.  . Prenatal Vit-Fe Fum-Fe Bisg-FA (NATACHEW) 28-1 MG CHEW Chew 1 tablet by mouth daily.    No Known Allergies  SOCIAL HISTORY/FAMILY HISTORY   Social History   Socioeconomic History  . Marital status: Significant Other    Spouse name: Not on file  . Number of children: 5  . Years of education: Not on file  . Highest education level: Not on file  Occupational History  . Occupation: Buyer, retail: [employer]  Tobacco Use  . Smoking status: Never Smoker  . Smokeless tobacco: Never Used  Vaping Use  . Vaping Use: Never used  Substance and Sexual Activity  . Alcohol use: No  . Drug use: No  . Sexual activity: Yes    Birth control/protection: None  Other Topics Concern  . Not on file  Social History Narrative   Being followed closely by OB/GYN due to prior high risk surgery.   Social Determinants of Health   Financial Resource Strain:   . Difficulty of Paying Living Expenses: Not on file  Food Insecurity: No Food Insecurity  . Worried About  Charity fundraiser in the Last Year: Never true  . Ran Out of Food in the Last Year: Never true  Transportation Needs: No Transportation Needs  . Lack of Transportation (Medical): No  . Lack of Transportation (Non-Medical): No  Physical Activity:   . Days of Exercise per Week: Not on file  . Minutes of Exercise per Session: Not on file  Stress:   . Feeling of  Stress : Not on file  Social Connections:   . Frequency of Communication with Friends and Family: Not on file  . Frequency of Social Gatherings with Friends and Family: Not on file  . Attends Religious Services: Not on file  . Active Member of Clubs or Organizations: Not on file  . Attends Archivist Meetings: Not on file  . Marital Status: Not on file    Social History   Social History Narrative   Being followed closely by OB/GYN due to prior high risk surgery.   Family History  Problem Relation Age of Onset  . Diabetes Mother   . Hypertension Mother   . Diabetes Father   . Hypertension Father     OBJCTIVE -PE, EKG, labs   Wt Readings from Last 3 Encounters:  05/17/20 167 lb 9.6 oz (76 kg)  05/03/20 162 lb (73.5 kg)  04/20/20 163 lb 2.3 oz (74 kg)    Physical Exam: BP 120/72   Pulse 99   Ht $R'4\' 11"'VN$  (1.499 m)   Wt 167 lb 9.6 oz (76 kg)   LMP 12/24/2019   SpO2 98%   BMI 33.85 kg/m  Physical Exam Constitutional:      General: She is not in acute distress.    Appearance: Normal appearance. She is normal weight. She is not ill-appearing.     Comments: Well-groomed.  HENT:     Head: Normocephalic and atraumatic.  Neck:     Vascular: No carotid bruit.     Comments: No JVD or HJR Cardiovascular:     Rate and Rhythm: Regular rhythm. Tachycardia present.     Pulses: Normal pulses.     Heart sounds: Murmur (Soft 1/6 SEM) heard.  No friction rub. No gallop.   Pulmonary:     Effort: Pulmonary effort is normal. No respiratory distress.     Breath sounds: Normal breath sounds.  Chest:     Chest  wall: No tenderness.  Abdominal:     Comments: Gravid uterus.  Musculoskeletal:        General: Swelling (Trivial bilateral) present. Normal range of motion.     Cervical back: Normal range of motion.  Skin:    General: Skin is warm and dry.  Neurological:     General: No focal deficit present.     Mental Status: She is alert and oriented to person, place, and time.     Cranial Nerves: No cranial nerve deficit.  Psychiatric:        Mood and Affect: Mood normal.        Behavior: Behavior normal.        Thought Content: Thought content normal.        Judgment: Judgment normal.     Comments: Anxious     Adult ECG Report  Rate: 99 ;  Rhythm: Borderline sinus tachycardia. ~Left atrial abnormality.  Otherwise normal EKG with nonspecific ST and T wave changes.;   Narrative Interpretation: Borderline EKG  Recent Labs:   CBC Latest Ref Rng & Units 03/30/2020 03/22/2020 03/21/2020  WBC 4.0 - 10.5 K/uL 8.4 7.4 8.1  Hemoglobin 12.0 - 15.0 g/dL 10.2(L) 10.6(L) 10.3(L)  Hematocrit 36 - 46 % 32.3(L) 33.1(L) 31.7(L)  Platelets 150 - 400 K/uL 256 249 304     No results found for: CHOL, HDL, LDLCALC, LDLDIRECT, TRIG, CHOLHDL Lab Results  Component Value Date   CREATININE 0.71 03/21/2020   BUN 8 03/21/2020   NA 137 03/21/2020   K 3.8 03/21/2020  CL 107 03/21/2020   CO2 22 03/21/2020   No results found for: TSH  ASSESSMENT/PLAN    Problem List Items Addressed This Visit    Hypertension affecting pregnancy, antepartum    Currently an issue.  Blood pressure looks normal.  This is previously been an issue.  She has been monitored closely by prenatal OB checks.      Hyperemesis gravidarum    I relatively convinced that her level of symptoms with palpitations and lightheadedness is all related to her hyperemesis.  It is interesting to me that she has been to the emergency room with this complaint, but has not mentioned in her OB visits.  I think aggressive management of her nausea is  extremely important because it is exacerbating her tachycardia.    Lack of oral intake leads to dehydration making her more susceptible to heat exposure and near syncope.      Relevant Orders   ECHOCARDIOGRAM COMPLETE   LONG TERM MONITOR (3-14 DAYS)   Rapid heart rate    I suspect that her tachycardia is probably sinus tachycardia related to dehydration.  Quite likely associated with her lightheadedness.  To exclude any true arrhythmias, will check 2-week Zio patch monitor.      Relevant Orders   ECHOCARDIOGRAM COMPLETE   LONG TERM MONITOR (3-14 DAYS)   Near syncope - Primary    She clearly had an episode where she almost passed out I think this is most consistent with heat exhaustion.  The fact that she was not sweating and felt flushed and hot not sweaty in the setting of extreme heat would go along with heat exhaustion.  Likely heat exposure associated with dehydration from hyperemesis gravidarum.  Although she may be eating, she is not getting enough liquid in.  I wonder if this potentially could also be explaining some of her abnormal vaginal bleeding.  Due to her pregnancy and concern for high risk findings, we will exclude true arrhythmias and structural cardiac abnormalities.  Plan:   2D echocardiogram and 2-week Zio patch monitor.  Encouraged hydration including potentially Pedialyte or Gatorade.  Making sure that she actually is is able to keep some food down even if it is crackers and bread in order to hold onto her fluid.  Discussed urine needing to be clear for adequate hydration.  Until etiology is determined or she is stabilized, I think it is best for her to avoid going back to work in the extreme heat.  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  Until her hyperemesis has improved, and/or the weather is improved, I recommend that she stay out of work.  We can reassess this at her follow-up visit.        Relevant Orders    ECHOCARDIOGRAM COMPLETE   LONG TERM MONITOR (3-14 DAYS)       COVID-19 Education: The signs and symptoms of COVID-19 were discussed with the patient and how to seek care for testing (follow up with PCP or arrange E-visit).   The importance of social distancing and COVID-19 vaccination was discussed today.  I spent a total of 74minutes with the patient. >  50% of the time was spent in direct patient consultation.  Additional time spent with chart review  / charting (studies, outside notes, etc): 20 Total Time: 45 min   Current medicines are reviewed at length with the patient today.  (+/- concerns) N/A  Notice: This dictation was prepared with Dragon dictation along  with smaller phrase technology. Any transcriptional errors that result from this process are unintentional and may not be corrected upon review.  Patient Instructions / Medication Changes & Studies & Tests Ordered   Patient Instructions  Medication Instructions:  No changes *If you need a refill on your cardiac medications before your next appointment, please call your pharmacy*   Lab Work: Not needed   Testing/Procedures: Will be schedule at [address] Your physician has requested that you have an echocardiogram. Echocardiography is a painless test that uses sound waves to create images of your heart. It provides your doctor with information about the size and shape of your heart and how well your heart's chambers and valves are working. This procedure takes approximately one hour. There are no restrictions for this procedure.  And  Your physician has recommended that you wear a 7 DAY ZIO-PATCH monitor. The Zio patch cardiac monitor continuously records heart rhythm data for up to 14 days, this is for patients being evaluated for multiple types heart rhythms. For the first 24 hours post application, please avoid getting the Zio monitor wet in the shower or by excessive sweating during exercise.  After that, feel free to carry on with regular activities. Keep soaps and lotions away from the ZIO XT Patch.  This will be mailed to you, please expect 7-10 days to receive.        Follow-Up: At [facility], you and your health needs are our priority.  As part of our continuing mission to provide you with exceptional heart care, we have created designated Provider Care Teams.  These Care Teams include your primary Cardiologist (physician) and Advanced Practice Providers (APPs -  Physician Assistants and Nurse Practitioners) who all work together to provide you with the care you need, when you need it.  We recommend signing up for the patient portal called "MyChart".  Sign up information is provided on this After Visit Summary.  MyChart is used to connect with patients for Virtual Visits (Telemedicine).  Patients are able to view lab/test results, encounter notes, upcoming appointments, etc.  Non-urgent messages can be sent to your provider as well.   To learn more about what you can do with MyChart, go to [email or web address].    Your next appointment:   5 week(s)  The format for your next appointment:   In Person  Provider:   Bryan Lemma, MD   Other Instructions  stay out of work until results are back and evaluated by Dr Herbie Baltimore.  ZIO XT- Long Term Monitor Instructions   Your physician has requested you wear your ZIO patch monitor____7___days.   This is a single patch monitor.  [employer] supplies one patch monitor per enrollment.  Additional stickers are not available.   Please do not apply patch if you will be having a Nuclear Stress Test, Echocardiogram, Cardiac CT, MRI, or Chest Xray during the time frame you would be wearing the monitor. The patch cannot be worn during these tests.  You cannot remove and re-apply the ZIO XT patch monitor.   Your ZIO patch monitor will be sent USPS Priority mail from [employer] directly to your home address. The monitor may  also be mailed to a PO BOX if home delivery is not available.   It may take 3-5 days to receive your monitor after you have been enrolled.   Once you have received you monitor, please review enclosed instructions.  Your monitor has already been registered assigning a  specific monitor serial # to you.   Applying the monitor   Shave hair from upper left chest.   Hold abrader disc by orange tab.  Rub abrader in 40 strokes over left upper chest as indicated in your monitor instructions.   Clean area with 4 enclosed alcohol pads .  Use all pads to assure are is cleaned thoroughly.  Let dry.   Apply patch as indicated in monitor instructions.  Patch will be place under collarbone on left side of chest with arrow pointing upward.   Rub patch adhesive wings for 2 minutes.Remove white label marked "1".  Remove white label marked "2".  Rub patch adhesive wings for 2 additional minutes.   While looking in a mirror, press and release button in center of patch.  A small green light will flash 3-4 times .  This will be your only indicator the monitor has been turned on.     Do not shower for the first 24 hours.  You may shower after the first 24 hours.   Press button if you feel a symptom. You will hear a small click.  Record Date, Time and Symptom in the Patient Log Book.   When you are ready to remove patch, follow instructions on last 2 pages of Patient Log Book.  Stick patch monitor onto last page of Patient Log Book.   Place Patient Log Book in [place] box.  Use locking tab on box and tape box closed securely.  The [employer] has [employer] on it.  Please place in mailbox as soon as possible.  Your physician should have your test results approximately 7 days after the monitor has been mailed back to [facility].   Call [facility] at [phone] if you have questions regarding your ZIO XT patch monitor.  Call them immediately if you see an orange light blinking on  your monitor.   If your monitor falls off in less than 4 days contact our Monitor department at [phone].  If your monitor becomes loose or falls off after 4 days call [employer] at [phone] for suggestions on securing your monitor.     Studies Ordered:   Orders Placed This Encounter  Procedures  . LONG TERM MONITOR (3-14 DAYS)  . ECHOCARDIOGRAM COMPLETE     Glenetta Hew, M.D., M.S. Interventional Cardiologist   Pager # [phone] Phone # [phone] [address] [place], [place] [address]   Thank you for choosing [facility] at [facility]!!

## 2020-05-20 ENCOUNTER — Encounter: Payer: Self-pay | Admitting: Cardiology

## 2020-05-20 NOTE — Assessment & Plan Note (Signed)
I relatively convinced that her level of symptoms with palpitations and lightheadedness is all related to her hyperemesis.  It is interesting to me that she has been to the emergency room with this complaint, but has not mentioned in her OB visits.  I think aggressive management of her nausea is extremely important because it is exacerbating her tachycardia.    Lack of oral intake leads to dehydration making her more susceptible to heat exposure and near syncope.

## 2020-05-20 NOTE — Assessment & Plan Note (Signed)
She clearly had an episode where she almost passed out I think this is most consistent with heat exhaustion.  The fact that she was not sweating and felt flushed and hot not sweaty in the setting of extreme heat would go along with heat exhaustion.  Likely heat exposure associated with dehydration from hyperemesis gravidarum.  Although she may be eating, she is not getting enough liquid in.  I wonder if this potentially could also be explaining some of her abnormal vaginal bleeding.  Due to her pregnancy and concern for high risk findings, we will exclude true arrhythmias and structural cardiac abnormalities.  Plan:   2D echocardiogram and 2-week Zio patch monitor.  Encouraged hydration including potentially Pedialyte or Gatorade.  Making sure that she actually is is able to keep some food down even if it is crackers and bread in order to hold onto her fluid.  Discussed urine needing to be clear for adequate hydration.  Until etiology is determined or she is stabilized, I think it is best for her to avoid going back to work in the extreme heat.  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  Until her hyperemesis has improved, and/or the weather is improved, I recommend that she stay out of work.  We can reassess this at her follow-up visit.

## 2020-05-20 NOTE — Assessment & Plan Note (Signed)
I suspect that her tachycardia is probably sinus tachycardia related to dehydration.  Quite likely associated with her lightheadedness.  To exclude any true arrhythmias, will check 2-week Zio patch monitor.

## 2020-05-20 NOTE — Assessment & Plan Note (Addendum)
Currently an issue.  Blood pressure looks normal.  This is previously been an issue.  She has been monitored closely by prenatal OB checks.

## 2020-05-22 ENCOUNTER — Ambulatory Visit (HOSPITAL_COMMUNITY): Payer: Medicaid Other | Attending: Internal Medicine

## 2020-05-22 ENCOUNTER — Other Ambulatory Visit: Payer: Self-pay

## 2020-05-22 DIAGNOSIS — R55 Syncope and collapse: Secondary | ICD-10-CM | POA: Insufficient documentation

## 2020-05-22 DIAGNOSIS — O210 Mild hyperemesis gravidarum: Secondary | ICD-10-CM | POA: Diagnosis present

## 2020-05-22 DIAGNOSIS — R000 Tachycardia, unspecified: Secondary | ICD-10-CM | POA: Diagnosis not present

## 2020-05-22 HISTORY — PX: TRANSTHORACIC ECHOCARDIOGRAM: SHX275

## 2020-05-22 LAB — ECHOCARDIOGRAM COMPLETE
Area-P 1/2: 3.65 cm2
S' Lateral: 2.3 cm

## 2020-05-22 NOTE — Addendum Note (Signed)
Addended by: Orlene Och on: 05/22/2020 10:36 AM   Modules accepted: Orders

## 2020-05-23 HISTORY — PX: OTHER SURGICAL HISTORY: SHX169

## 2020-05-24 ENCOUNTER — Other Ambulatory Visit (INDEPENDENT_AMBULATORY_CARE_PROVIDER_SITE_OTHER): Payer: Medicaid Other

## 2020-05-24 DIAGNOSIS — O210 Mild hyperemesis gravidarum: Secondary | ICD-10-CM

## 2020-05-24 DIAGNOSIS — R55 Syncope and collapse: Secondary | ICD-10-CM | POA: Diagnosis not present

## 2020-05-24 DIAGNOSIS — R000 Tachycardia, unspecified: Secondary | ICD-10-CM | POA: Diagnosis not present

## 2020-05-31 ENCOUNTER — Other Ambulatory Visit: Payer: Self-pay

## 2020-05-31 ENCOUNTER — Ambulatory Visit (INDEPENDENT_AMBULATORY_CARE_PROVIDER_SITE_OTHER): Payer: Medicaid Other | Admitting: Obstetrics and Gynecology

## 2020-05-31 ENCOUNTER — Ambulatory Visit: Payer: Medicaid Other | Attending: Obstetrics & Gynecology

## 2020-05-31 ENCOUNTER — Other Ambulatory Visit: Payer: Self-pay | Admitting: *Deleted

## 2020-05-31 ENCOUNTER — Ambulatory Visit: Payer: Medicaid Other | Admitting: *Deleted

## 2020-05-31 VITALS — BP 104/62 | HR 91 | Wt 168.0 lb

## 2020-05-31 DIAGNOSIS — O34219 Maternal care for unspecified type scar from previous cesarean delivery: Secondary | ICD-10-CM

## 2020-05-31 DIAGNOSIS — R55 Syncope and collapse: Secondary | ICD-10-CM

## 2020-05-31 DIAGNOSIS — I10 Essential (primary) hypertension: Secondary | ICD-10-CM

## 2020-05-31 DIAGNOSIS — O99212 Obesity complicating pregnancy, second trimester: Secondary | ICD-10-CM | POA: Diagnosis present

## 2020-05-31 DIAGNOSIS — Z6832 Body mass index (BMI) 32.0-32.9, adult: Secondary | ICD-10-CM

## 2020-05-31 DIAGNOSIS — O169 Unspecified maternal hypertension, unspecified trimester: Secondary | ICD-10-CM | POA: Diagnosis present

## 2020-05-31 DIAGNOSIS — O0990 Supervision of high risk pregnancy, unspecified, unspecified trimester: Secondary | ICD-10-CM

## 2020-05-31 DIAGNOSIS — O210 Mild hyperemesis gravidarum: Secondary | ICD-10-CM

## 2020-05-31 DIAGNOSIS — D563 Thalassemia minor: Secondary | ICD-10-CM

## 2020-05-31 DIAGNOSIS — O99210 Obesity complicating pregnancy, unspecified trimester: Secondary | ICD-10-CM | POA: Diagnosis not present

## 2020-05-31 NOTE — Progress Notes (Signed)
   PRENATAL VISIT NOTE  Subjective:  Gwendolyn Lynch is a 34 y.o. G6P5005 at [redacted]w[redacted]d being seen today for ongoing prenatal care.  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  Patient doing well with no acute concerns today. She reports no complaints.  Contractions: Not present. Vag. Bleeding: None.  Movement: Present. Denies leaking of fluid.   Per pt she has completed her extended cardiac monitoring and is waiting for the results.  She notes her nausea and vomiting has improved and she declines any antiemetics including diclegis.  Again discussed increased risk of complications with high number cesarean sections.  Pt notes understanding and desires postplacental IUD.  The following portions of the patient's history were reviewed and updated as appropriate: allergies, current medications, past family history, past medical history, past social history, past surgical history and problem list. Problem list updated.  Objective:   Vitals:   05/31/20 1116  BP: 104/62  Pulse: 91  Weight: 168 lb (76.2 kg)    Fetal Status: Fetal Heart Rate (bpm): 146   Movement: Present     General:  Alert, oriented and cooperative. Patient is in no acute distress.  Skin: Skin is warm and dry. No rash noted.   Cardiovascular: Normal heart rate noted  Respiratory: Normal respiratory effort, no problems with respiration noted  Abdomen: Soft, gravid, appropriate for gestational age.  Pain/Pressure: Present     Pelvic: Cervical exam deferred        Extremities: Normal range of motion.  Edema: None  Mental Status:  Normal mood and  affect. Normal behavior. Normal judgment and thought content.   Assessment and Plan:  Pregnancy: G6P5005 at 104w0d  1. Supervision of high risk pregnancy, antepartum Pt declined flu shot - AFP, Serum, Open Spina Bifida  2. Hypertension affecting pregnancy, antepartum Normal BP noted today  3. Near syncope Completed extended monitoring, still followed by cards  4. Hyperemesis gravidarum Appears to have resolved  5. 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 History of cesarean delivery, currently pregnant Can schedule after next visit, no tubal  7. BMI 32.0-32.9,adult   8. Obesity affecting pregnancy in second trimester   Preterm labor symptoms and general obstetric precautions including but not limited to vaginal bleeding, contractions, leaking of fluid and fetal movement were reviewed in detail with the patient.  Please refer to After Visit Summary for other counseling recommendations.   Return in about 4 weeks (around 06/28/2020) for [facility], in person.   Mariel Aloe, MD

## 2020-05-31 NOTE — Progress Notes (Signed)
Home medicaid form completed.  Ladona Ridgel, RN

## 2020-05-31 NOTE — Patient Instructions (Signed)

## 2020-06-02 LAB — AFP, SERUM, OPEN SPINA BIFIDA
AFP MoM: 1.59
AFP Value: 83.3 ng/mL
Gest. Age on Collection Date: 19 weeks
Maternal Age At EDD: 33.5 yr
OSBR Risk 1 IN: 4310
Test Results:: NEGATIVE
Weight: 168 [lb_av]

## 2020-06-20 ENCOUNTER — Other Ambulatory Visit: Payer: Self-pay

## 2020-06-20 ENCOUNTER — Ambulatory Visit (INDEPENDENT_AMBULATORY_CARE_PROVIDER_SITE_OTHER): Payer: Medicaid Other | Admitting: Cardiology

## 2020-06-20 ENCOUNTER — Encounter: Payer: Self-pay | Admitting: Cardiology

## 2020-06-20 VITALS — BP 122/64 | HR 112 | Ht <= 58 in | Wt 173.2 lb

## 2020-06-20 DIAGNOSIS — R000 Tachycardia, unspecified: Secondary | ICD-10-CM | POA: Diagnosis not present

## 2020-06-20 DIAGNOSIS — O169 Unspecified maternal hypertension, unspecified trimester: Secondary | ICD-10-CM | POA: Diagnosis not present

## 2020-06-20 DIAGNOSIS — R55 Syncope and collapse: Secondary | ICD-10-CM

## 2020-06-20 NOTE — Patient Instructions (Signed)
Medication Instructions:  NO CHANGES  *If you need a refill on your cardiac medications before your next appointment, please call your pharmacy*   Lab Work: NOT NEEDED   Testing/Procedures: NOT NEEDED   Follow-Up: At [facility], you and your health needs are our priority.  As part of our continuing mission to provide you with exceptional heart care, we have created designated Provider Care Teams.  These Care Teams include your primary Cardiologist (physician) and Advanced Practice Providers (APPs -  Physician Assistants and Nurse Practitioners) who all work together to provide you with the care you need, when you need it.    Your next appointment:   6 month(s)  The format for your next appointment:   In Person  Provider:   Bryan Lemma, MD   Other Instructions KEEP  HYDRATED  [facility] RELAXATION

## 2020-06-20 NOTE — Progress Notes (Signed)
Primary Care Provider: Patient, No Pcp Per Cardiologist: Glenetta Hew, MD Electrophysiologist: None  Clinic Note: No chief complaint on file.   HPI:    Gwendolyn Lynch is a 34 y.o. female with a PMH below who presents today for close 1 month follow-up for tachycardia and near syncope.  Gwendolyn Lynch was seen for initial consultation on August 26.  She had had several ER hospital visits related to hyperemesis gravidarum and vaginal bleeding.  She was noticing episodes of tachycardia and lightheadedness but not really true syncope.  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  She was in the hot truck extremely flushed heart racing and shortness of breath.  She was treated with IV fluids and antiemetics in the ER.  Since then she has been noticing significant tachycardia but is also having issues with significant nausea and vomiting.  Quite likely dehydrated.  We ordered a Zio patch monitor and an echocardiogram.  Recent Hospitalizations: No recent hospitalizations or ER visits since last visit.  Reviewed  CV studies:    The following studies were reviewed today: (if available, images/films reviewed: From Epic Chart or Care Everywhere) . Event monitor (September 2021): Rhythm was normal sinus.  Average heart rate 92 bpm, max heart rate 166 bpm with minimum heart rate of 68 bpm.  Mostly sinus rhythm.  PACs and PVCs.  No arrhythmia noted. . Echocardiogram (05/22/2020): EF 55 to 60%.  Normal diastolic parameters.  Normal right ventricle.  Normal valves.   Interval History:   Gwendolyn Lynch returns today May be doing a little bit better.  Palpitations and heart rate spells are doing overall better she just is having a hard time dealing with the stress of this pregnancy compared to her previous pregnancies.  She has yet to go back to work.  As long as she is in low stress  situation where she is able to avoid heat and stay adequately hydrated, she says that she has been feeling better.  She is pretty much through the nausea phase of her pregnancy now and with that means that she is a lot less dizzy, but just feels fatigued and exhausted because she still is not sleeping well.  She does think the palpitations have improved overall and was happy to hear the results of her studies.  CV Review of Symptoms (Summary) Cardiovascular ROS: positive for - palpitations and These are much improved.  She also has exertional dyspnea mostly because of the difficulty walking being pregnant negative for - chest pain, edema, orthopnea, paroxysmal nocturnal dyspnea, shortness of breath or Syncope/near syncope, TIA/amaurosis fugax and claudication  The patient does not have symptoms concerning for COVID-19 infection (fever, chills, cough, or new shortness of breath).   REVIEWED OF SYSTEMS   Review of Systems  Constitutional: Positive for malaise/fatigue (She just feels worn out with this pregnancy.  Has been having a difficult time.).  HENT: Negative for congestion and nosebleeds.   Respiratory: Positive for shortness of breath. Negative for cough.   Gastrointestinal: Negative for blood in stool and melena.  Genitourinary: Negative for hematuria.  Musculoskeletal: Positive for joint pain. Negative for falls.  Neurological: Positive for dizziness (When standing up fast). Negative for focal weakness.  Psychiatric/Behavioral: Negative for memory loss. The patient is nervous/anxious. The patient does not have insomnia.    I have reviewed and (if needed) personally updated the patient's  problem list, medications, allergies, past medical and surgical history, social and family history.   PAST MEDICAL HISTORY   Past Medical History:  Diagnosis Date  . Anemia   . Hemorrhoids   . Hyperemesis gravidarum   . Hypertension affecting pregnancy    Has had hypertension during previous  pregnancies  . Tachycardia   . 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 CESAREAN SECTION     C/S x 3  . CESAREAN SECTION N/A 11/01/2015   Procedure: CESAREAN SECTION;  Surgeon: Crawford Givens, MD;  Location: [facility];  Service: Obstetrics;  Laterality: N/A;  . CESAREAN SECTION N/A 04/22/2017   Procedure: REPEAT CESAREAN SECTION;  Surgeon: Caren Macadam, MD;  Location: [facility];  Service: Obstetrics;  Laterality: N/A;  . EVENT MONITOR  05/2020   Rhythm was normal sinus.  Average heart rate 92 bpm, max heart rate 166 bpm with minimum heart rate of 68 bpm.  Mostly sinus rhythm.  PACs and PVCs.  No arrhythmia noted.  . TRANSTHORACIC ECHOCARDIOGRAM  05/22/2020   EF 55 to 60%.  Normal diastolic parameters.  Normal right ventricle.  Normal valves.    Immunization History  Administered Date(s) Administered  . Influenza,inj,Quad PF,6+ Mos 11/25/2016  . Tdap 02/10/2017    MEDICATIONS/ALLERGIES   Current Meds  Medication Sig  . aspirin EC 81 MG tablet Take 1 tablet (81 mg total) by mouth daily. Start on 04/12/20  . Blood Pressure Monitoring (BLOOD PRESSURE KIT) DEVI 1 Device by Does not apply route as needed.  . Prenatal Vit-Fe Fum-Fe Bisg-FA (NATACHEW) 28-1 MG CHEW Chew 1 tablet by mouth daily.    No Known Allergies  SOCIAL HISTORY/FAMILY HISTORY   Reviewed in Epic:  Pertinent findings: Still Not working --she does not think that she can handle the stresses of her job during the last few months of her pregnancy.  OBJCTIVE -PE, EKG, labs   Wt Readings from Last 3 Encounters:  06/28/20 174 lb 6.4 oz (79.1 kg)  06/20/20 173 lb 3.2 oz (78.6 kg)  05/31/20 168 lb (76.2 kg)    Physical Exam: BP 122/64   Pulse (!) 112   Ht $R'4\' 10"'FQ$  (1.473 m)   Wt 173 lb 3.2 oz (78.6 kg)   LMP 12/24/2019   SpO2 98%   BMI 36.20 kg/m  Physical Exam Constitutional:      General: She is not in acute distress.    Appearance:  Normal appearance. She is not ill-appearing.  HENT:     Head: Normocephalic and atraumatic.  Cardiovascular:     Rate and Rhythm: Regular rhythm. Tachycardia present.     Pulses: Normal pulses.     Heart sounds: Murmur (1/6 SEM at RUSB) heard.  No friction rub. No gallop.   Pulmonary:     Effort: Pulmonary effort is normal.     Breath sounds: Normal breath sounds.  Chest:     Chest wall: No tenderness.  Abdominal:     Comments: Gravid uterus-abdomen not palpated  Musculoskeletal:        General: Swelling (Trivial bilateral) present. Normal range of motion.     Right lower leg: Edema present.     Left lower leg: Edema present.  Neurological:     General: No focal deficit present.     Mental Status: She is alert and oriented to person, place, and time.  Psychiatric:        Mood  and Affect: Mood normal.        Behavior: Behavior normal.        Thought Content: Thought content normal.        Judgment: Judgment normal.     Comments: She still seems little bit tired and worn out     Adult ECG Report Not checked  Recent Labs:   CBC Latest Ref Rng & Units 03/30/2020 03/22/2020 03/21/2020  WBC 4.0 - 10.5 K/uL 8.4 7.4 8.1  Hemoglobin 12.0 - 15.0 g/dL 10.2(L) 10.6(L) 10.3(L)  Hematocrit 36 - 46 % 32.3(L) 33.1(L) 31.7(L)  Platelets 150 - 400 K/uL 256 249 304    No results found for: CHOL, HDL, LDLCALC, LDLDIRECT, TRIG, CHOLHDL Lab Results  Component Value Date   CREATININE 0.71 03/21/2020   BUN 8 03/21/2020   NA 137 03/21/2020   K 3.8 03/21/2020   CL 107 03/21/2020   CO2 22 03/21/2020   No results found for: TSH  ASSESSMENT/PLAN    Problem List Items Addressed This Visit    Hypertension affecting pregnancy, antepartum (Chronic)    Blood pressure actually seems to be doing pretty well today.  Trying to avoid medications during pregnancy.      Rapid heart rate - Primary (Chronic)    She clearly has sinus tachycardia and it probably is related to pregnancy, dehydration and  potentially mild anemia.  No real arrhythmias noted on  Plan: Recommend hydration and avoiding exacerbating situations such as heat, lack of airflow and congestion of people      Near syncope (Chronic)    Thankfully, now that she is not in the stressful situation, or significant heat, she has not had any further episodes like the one that brought her to me.  She said overall she is feeling better. Continue to recommend adequate hydration avoiding stress/taking Lasix.  She will need a work note to excuse her from work to put her under stressful situation.  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           COVID-19 Education: The signs and symptoms of COVID-19 were discussed with the patient and how to seek care for testing (follow up with PCP or arrange E-visit).   The importance of social distancing and COVID-19 vaccination was discussed today.  The patient is practicing social distancing & Masking.   I spent a total of 23 minutes with the patient spent in direct patient consultation.  We reviewed the echocardiogram and monitor results together.  Discussed the meaning of all the findings.  Multiple questions answered. Additional time spent with chart review  / charting (studies, outside notes, etc): 11 Total Time: 34 min   Current medicines are reviewed at length with the patient today.  (+/- concerns) n/a  Notice: This dictation was prepared with Dragon dictation along with smaller phrase technology. Any transcriptional errors that result from this process are unintentional and may not be corrected upon review.  Patient Instructions / Medication Changes & Studies & Tests Ordered   Patient Instructions  Medication Instructions:  NO CHANGES  *If you need a refill on your cardiac medications before your next appointment, please call your pharmacy*   Lab Work: NOT  NEEDED   Testing/Procedures: NOT NEEDED   Follow-Up: At [facility], you and your health needs are our priority.  As part of our continuing mission to provide you with exceptional  heart care, we have created designated Provider Care Teams.  These Care Teams include your primary Cardiologist (physician) and Advanced Practice Providers (APPs -  Physician Assistants and Nurse Practitioners) who all work together to provide you with the care you need, when you need it.    Your next appointment:   6 month(s)  The format for your next appointment:   In Person  Provider:   Glenetta Hew, MD   Other Instructions KEEP  HYDRATED  [place]     Studies Ordered:   No orders of the defined types were placed in this encounter.    Glenetta Hew, M.D., M.S. Interventional Cardiologist   Pager # [phone] Phone # [phone] [address] [place], [place] [address]   Thank you for choosing Heartcare at [facility]!!

## 2020-06-28 ENCOUNTER — Ambulatory Visit: Payer: Medicaid Other | Admitting: *Deleted

## 2020-06-28 ENCOUNTER — Other Ambulatory Visit: Payer: Self-pay | Admitting: *Deleted

## 2020-06-28 ENCOUNTER — Encounter: Payer: Self-pay | Admitting: Obstetrics and Gynecology

## 2020-06-28 ENCOUNTER — Other Ambulatory Visit: Payer: Self-pay

## 2020-06-28 ENCOUNTER — Ambulatory Visit: Payer: Medicaid Other | Attending: Obstetrics and Gynecology

## 2020-06-28 ENCOUNTER — Ambulatory Visit (INDEPENDENT_AMBULATORY_CARE_PROVIDER_SITE_OTHER): Payer: Medicaid Other | Admitting: Obstetrics and Gynecology

## 2020-06-28 VITALS — BP 125/72 | HR 101 | Wt 174.4 lb

## 2020-06-28 DIAGNOSIS — O322XX0 Maternal care for transverse and oblique lie, not applicable or unspecified: Secondary | ICD-10-CM

## 2020-06-28 DIAGNOSIS — O169 Unspecified maternal hypertension, unspecified trimester: Secondary | ICD-10-CM

## 2020-06-28 DIAGNOSIS — O34219 Maternal care for unspecified type scar from previous cesarean delivery: Secondary | ICD-10-CM

## 2020-06-28 DIAGNOSIS — O0990 Supervision of high risk pregnancy, unspecified, unspecified trimester: Secondary | ICD-10-CM

## 2020-06-28 DIAGNOSIS — I10 Essential (primary) hypertension: Secondary | ICD-10-CM | POA: Diagnosis present

## 2020-06-28 DIAGNOSIS — O99210 Obesity complicating pregnancy, unspecified trimester: Secondary | ICD-10-CM | POA: Diagnosis not present

## 2020-06-28 DIAGNOSIS — O99212 Obesity complicating pregnancy, second trimester: Secondary | ICD-10-CM | POA: Insufficient documentation

## 2020-06-28 DIAGNOSIS — Z3A23 23 weeks gestation of pregnancy: Secondary | ICD-10-CM

## 2020-06-28 DIAGNOSIS — Z362 Encounter for other antenatal screening follow-up: Secondary | ICD-10-CM | POA: Diagnosis not present

## 2020-06-28 DIAGNOSIS — Z148 Genetic carrier of other disease: Secondary | ICD-10-CM

## 2020-06-28 DIAGNOSIS — O09292 Supervision of pregnancy with other poor reproductive or obstetric history, second trimester: Secondary | ICD-10-CM

## 2020-06-28 DIAGNOSIS — D563 Thalassemia minor: Secondary | ICD-10-CM

## 2020-06-28 DIAGNOSIS — O10919 Unspecified pre-existing hypertension complicating pregnancy, unspecified trimester: Secondary | ICD-10-CM

## 2020-06-28 DIAGNOSIS — O10012 Pre-existing essential hypertension complicating pregnancy, second trimester: Secondary | ICD-10-CM

## 2020-06-28 NOTE — Patient Instructions (Signed)

## 2020-06-28 NOTE — Progress Notes (Signed)
Pt states dull pain in back, BP elevated last week , cold sweat, is concerned. Happened again this Wednesday with back pain that didn't stop.

## 2020-06-28 NOTE — Progress Notes (Signed)
Subjective:  Gwendolyn Lynch is a 34 y.o. G6P5005 at [redacted]w[redacted]d being seen today for ongoing prenatal care.  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  Patient reports general discomforts of pregnancy.  Contractions: Irritability. Vag. Bleeding: None.  Movement: Present. Denies leaking of fluid.   The following portions of the patient's history were reviewed and updated as appropriate: allergies, current medications, past family history, past medical history, past social history, past surgical history and problem list. Problem list updated.  Objective:   Vitals:   06/28/20 1447  BP: 125/72  Pulse: (!) 101  Weight: 174 lb 6.4 oz (79.1 kg)    Fetal Status: Fetal Heart Rate (bpm): 143   Movement: Present     General:  Alert, oriented and cooperative. Patient is in no acute distress.  Skin: Skin is warm and dry. No rash noted.   Cardiovascular: Normal heart rate noted  Respiratory: Normal respiratory effort, no problems with respiration noted  Abdomen: Soft, gravid, appropriate for gestational age. Pain/Pressure: Present     Pelvic:  Cervical exam deferred        Extremities: Normal range of motion.  Edema: None  Mental Status: Normal mood and affect. Normal behavior. Normal judgment and thought content.   Urinalysis:      Assessment and Plan:  Pregnancy: G6P5005 at [redacted]w[redacted]d  1. Supervision of high risk pregnancy, antepartum Stable U/S today Glucola next visit  2. Hypertension affecting pregnancy, antepartum BP stable without meds Continue with qd BASA  3. History of cesarean delivery, currently pregnant Will need repeat at 39  weeks  4. 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 Please refer to After Visit Summary for other counseling recommendations.  Return in about 4 weeks (around 07/26/2020) for OB visit, face to face, any provider, fasting fro glucola.   Hermina Staggers, MD

## 2020-06-29 ENCOUNTER — Encounter: Payer: Self-pay | Admitting: Cardiology

## 2020-06-29 NOTE — Assessment & Plan Note (Signed)
Blood pressure actually seems to be doing pretty well today.  Trying to avoid medications during pregnancy.

## 2020-06-29 NOTE — Assessment & Plan Note (Signed)
Thankfully, now that she is not in the stressful situation, or significant heat, she has not had any further episodes like the one that brought her to me.  She said overall she is feeling better. Continue to recommend adequate hydration avoiding stress/taking Lasix.  She will need a work note to excuse her from work to put her under stressful situation.  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

## 2020-06-29 NOTE — Assessment & Plan Note (Signed)
She clearly has sinus tachycardia and it probably is related to pregnancy, dehydration and potentially mild anemia.  No real arrhythmias noted on  Plan: Recommend hydration and avoiding exacerbating situations such as heat, lack of airflow and congestion of people

## 2020-07-26 ENCOUNTER — Other Ambulatory Visit: Payer: Self-pay

## 2020-07-26 ENCOUNTER — Ambulatory Visit: Payer: Medicaid Other

## 2020-07-26 ENCOUNTER — Ambulatory Visit: Payer: Medicaid Other | Attending: Obstetrics and Gynecology

## 2020-07-26 ENCOUNTER — Encounter: Payer: Medicaid Other | Admitting: Obstetrics and Gynecology

## 2020-07-26 DIAGNOSIS — O0990 Supervision of high risk pregnancy, unspecified, unspecified trimester: Secondary | ICD-10-CM

## 2020-07-30 ENCOUNTER — Other Ambulatory Visit: Payer: Medicaid Other

## 2020-07-30 ENCOUNTER — Encounter: Payer: Medicaid Other | Admitting: Obstetrics & Gynecology

## 2020-09-10 ENCOUNTER — Other Ambulatory Visit: Payer: Self-pay | Admitting: Obstetrics & Gynecology

## 2020-09-17 ENCOUNTER — Encounter (HOSPITAL_COMMUNITY): Payer: Self-pay | Admitting: Obstetrics and Gynecology

## 2020-09-17 ENCOUNTER — Observation Stay (HOSPITAL_COMMUNITY)
Admission: AD | Admit: 2020-09-17 | Discharge: 2020-09-18 | Disposition: A | Discharge status: Home / Self Care | Payer: Medicaid Other | Attending: Obstetrics and Gynecology | Admitting: Obstetrics and Gynecology

## 2020-09-17 ENCOUNTER — Other Ambulatory Visit: Payer: Self-pay

## 2020-09-17 ENCOUNTER — Inpatient Hospital Stay (HOSPITAL_BASED_OUTPATIENT_CLINIC_OR_DEPARTMENT_OTHER): Payer: Medicaid Other

## 2020-09-17 DIAGNOSIS — O26893 Other specified pregnancy related conditions, third trimester: Secondary | ICD-10-CM | POA: Diagnosis present

## 2020-09-17 DIAGNOSIS — U071 COVID-19: Secondary | ICD-10-CM | POA: Diagnosis not present

## 2020-09-17 DIAGNOSIS — O98513 Other viral diseases complicating pregnancy, third trimester: Secondary | ICD-10-CM | POA: Diagnosis not present

## 2020-09-17 DIAGNOSIS — O99891 Other specified diseases and conditions complicating pregnancy: Secondary | ICD-10-CM | POA: Diagnosis not present

## 2020-09-17 DIAGNOSIS — Z3A34 34 weeks gestation of pregnancy: Secondary | ICD-10-CM | POA: Diagnosis not present

## 2020-09-17 DIAGNOSIS — R112 Nausea with vomiting, unspecified: Secondary | ICD-10-CM | POA: Diagnosis present

## 2020-09-17 DIAGNOSIS — O1213 Gestational proteinuria, third trimester: Secondary | ICD-10-CM

## 2020-09-17 DIAGNOSIS — O368390 Maternal care for abnormalities of the fetal heart rate or rhythm, unspecified trimester, not applicable or unspecified: Secondary | ICD-10-CM | POA: Diagnosis present

## 2020-09-17 DIAGNOSIS — O133 Gestational [pregnancy-induced] hypertension without significant proteinuria, third trimester: Secondary | ICD-10-CM | POA: Diagnosis not present

## 2020-09-17 DIAGNOSIS — Z369 Encounter for antenatal screening, unspecified: Secondary | ICD-10-CM

## 2020-09-17 DIAGNOSIS — Z7982 Long term (current) use of aspirin: Secondary | ICD-10-CM | POA: Diagnosis not present

## 2020-09-17 LAB — COMPREHENSIVE METABOLIC PANEL
ALT: 17 U/L (ref 0–44)
AST: 35 U/L (ref 15–41)
Albumin: 2.4 g/dL — ABNORMAL LOW (ref 3.5–5.0)
Alkaline Phosphatase: 111 U/L (ref 38–126)
Anion gap: 14 (ref 5–15)
BUN: 8 mg/dL (ref 6–20)
CO2: 18 mmol/L — ABNORMAL LOW (ref 22–32)
Calcium: 9.3 mg/dL (ref 8.9–10.3)
Chloride: 104 mmol/L (ref 98–111)
Creatinine, Ser: 0.91 mg/dL (ref 0.44–1.00)
GFR, Estimated: >60 mL/min (ref >60)
Glucose, Bld: 78 mg/dL (ref 70–99)
Potassium: 3.5 mmol/L (ref 3.5–5.1)
Sodium: 136 mmol/L (ref 135–145)
Total Bilirubin: 0.7 mg/dL (ref 0.3–1.2)
Total Protein: 6.7 g/dL (ref 6.5–8.1)

## 2020-09-17 LAB — URINALYSIS, ROUTINE W REFLEX MICROSCOPIC
Bilirubin Urine: NEGATIVE
Glucose, UA: NEGATIVE mg/dL
Ketones, ur: 80 mg/dL — AB
Nitrite: NEGATIVE
Protein, ur: 100 mg/dL — AB
Specific Gravity, Urine: 1.025 (ref 1.005–1.030)
pH: 5 (ref 5.0–8.0)

## 2020-09-17 LAB — CBC WITH DIFFERENTIAL/PLATELET
Abs Immature Granulocytes: 0.16 10*3/uL — ABNORMAL HIGH (ref 0.00–0.07)
Basophils Absolute: 0 10*3/uL (ref 0.0–0.1)
Basophils Relative: 0 %
Eosinophils Absolute: 0.1 10*3/uL (ref 0.0–0.5)
Eosinophils Relative: 1 %
HCT: 29.7 % — ABNORMAL LOW (ref 36.0–46.0)
Hemoglobin: 9.7 g/dL — ABNORMAL LOW (ref 12.0–15.0)
Immature Granulocytes: 2 %
Lymphocytes Relative: 24 %
Lymphs Abs: 1.8 10*3/uL (ref 0.7–4.0)
MCH: 24.2 pg — ABNORMAL LOW (ref 26.0–34.0)
MCHC: 32.7 g/dL (ref 30.0–36.0)
MCV: 74.1 fL — ABNORMAL LOW (ref 80.0–100.0)
Monocytes Absolute: 0.6 10*3/uL (ref 0.1–1.0)
Monocytes Relative: 8 %
Neutro Abs: 4.9 10*3/uL (ref 1.7–7.7)
Neutrophils Relative %: 65 %
Platelets: 254 10*3/uL (ref 150–400)
RBC: 4.01 MIL/uL (ref 3.87–5.11)
RDW: 15.3 % (ref 11.5–15.5)
WBC: 7.6 10*3/uL (ref 4.0–10.5)
nRBC: 0 % (ref 0.0–0.2)

## 2020-09-17 LAB — TYPE AND SCREEN
ABO/RH(D): O POS
Antibody Screen: NEGATIVE

## 2020-09-17 LAB — RESP PANEL BY RT-PCR (FLU A&B, COVID) ARPGX2
Influenza A by PCR: NEGATIVE
Influenza B by PCR: NEGATIVE
SARS Coronavirus 2 by RT PCR: POSITIVE — AB

## 2020-09-17 MED ORDER — CALCIUM CARBONATE ANTACID 500 MG PO CHEW: 2.0000 | CHEWABLE_TABLET | ORAL | Status: DC | PRN

## 2020-09-17 MED ORDER — SODIUM CHLORIDE 0.9 % IV SOLN
INTRAVENOUS | Status: DC | PRN
  Administered 2020-09-17: 20:00:00 1000 mL via INTRAVENOUS

## 2020-09-17 MED ORDER — FAMOTIDINE IN NACL 20-0.9 MG/50ML-% IV SOLN
20.0000 mg | Freq: Once | INTRAVENOUS | Status: DC | PRN
  Filled 2020-09-17: qty 50

## 2020-09-17 MED ORDER — ACETAMINOPHEN 325 MG PO TABS
650.0000 mg | ORAL_TABLET | ORAL | Status: DC | PRN
  Administered 2020-09-17: 650 mg via ORAL
  Filled 2020-09-17: qty 2

## 2020-09-17 MED ORDER — DIPHENHYDRAMINE HCL 50 MG/ML IJ SOLN: 50.0000 mg | Freq: Once | INTRAMUSCULAR | Status: DC | PRN

## 2020-09-17 MED ORDER — PRENATAL MULTIVITAMIN CH
1.0000 | ORAL_TABLET | Freq: Every day | ORAL | Status: DC
  Administered 2020-09-18: 12:00:00 1 via ORAL
  Filled 2020-09-17: qty 1

## 2020-09-17 MED ORDER — BETAMETHASONE SOD PHOS & ACET 6 (3-3) MG/ML IJ SUSP
12.0000 mg | INTRAMUSCULAR | Status: AC
  Administered 2020-09-17 – 2020-09-18 (×2): 12 mg via INTRAMUSCULAR
  Filled 2020-09-17: qty 5

## 2020-09-17 MED ORDER — EPINEPHRINE 0.3 MG/0.3ML IJ SOAJ
0.3000 mg | Freq: Once | INTRAMUSCULAR | Status: DC | PRN
  Filled 2020-09-17: qty 0.6

## 2020-09-17 MED ORDER — LACTATED RINGERS IV BOLUS
1000.0000 mL | Freq: Once | INTRAVENOUS | Status: AC
  Administered 2020-09-17: 18:00:00 1000 mL via INTRAVENOUS

## 2020-09-17 MED ORDER — SODIUM CHLORIDE 0.9 % IV SOLN
Freq: Once | INTRAVENOUS | Status: AC
  Filled 2020-09-17: qty 5

## 2020-09-17 MED ORDER — ZOLPIDEM TARTRATE 5 MG PO TABS: 5.0000 mg | ORAL_TABLET | Freq: Every evening | ORAL | Status: DC | PRN

## 2020-09-17 MED ORDER — ALBUTEROL SULFATE HFA 108 (90 BASE) MCG/ACT IN AERS
2.0000 | INHALATION_SPRAY | Freq: Once | RESPIRATORY_TRACT | Status: DC | PRN
  Filled 2020-09-17: qty 6.7

## 2020-09-17 MED ORDER — METHYLPREDNISOLONE SODIUM SUCC 125 MG IJ SOLR: 125.0000 mg | Freq: Once | INTRAMUSCULAR | Status: DC | PRN

## 2020-09-17 MED ORDER — DOCUSATE SODIUM 100 MG PO CAPS
100.0000 mg | ORAL_CAPSULE | Freq: Every day | ORAL | Status: DC
  Filled 2020-09-17: qty 1

## 2020-09-17 MED ORDER — M.V.I. ADULT IV INJ
Freq: Once | INTRAVENOUS | Status: AC
  Filled 2020-09-17: qty 10

## 2020-09-17 NOTE — MAU Provider Note (Signed)
History     CSN: [id number]  Arrival date and time: 09/17/20 1443   Event Date/Time   First Provider Initiated Contact with Patient 09/17/20 1830      Chief Complaint  Patient presents with  . Nausea  . Emesis  . Vaginal Discharge  . Vaginal Itching    Tried over the counter vagisil-without resolution  . loss of taste and smell    X4 days    HPI Gwendolyn Lynch is a 34 y.o. [id number] at 14w4dwho presents to MAU with chief complaint of nausea and vomiting for the past two days. She states she has been unable to tolerate anything PO in that time.   Patient endorses loss of taste and smell about four days ago. She denies exposure to ill people. She denies fever, chills, muscle aches. She is s/p flu vaccine but did not receive a COVID vaccine.  She denies pain, vaginal bleeding, leaking of fluid, DFM, fever or recent illness.  Patient receives care with [facility].   OB History    Gravida  6   Para  5   Term  5   Preterm  0   AB  0   Living  5     SAB  0   IAB  0   Ectopic  0   Multiple  0   Live Births  5           Past Medical History:  Diagnosis Date  . Anemia   . Hemorrhoids   . Hyperemesis gravidarum   . Hypertension affecting pregnancy    Has had hypertension during previous pregnancies  . Tachycardia   . Thalassemia carrier     Past Surgical History:  Procedure Laterality Date  . CESAREAN SECTION     C/S x 3  . CESAREAN SECTION N/A 11/01/2015   Procedure: CESAREAN SECTION;  Surgeon: NCrawford Givens MD;  [facility];  Service: Obstetrics;  Laterality: N/A;  . CESAREAN SECTION N/A 04/22/2017   Procedure: REPEAT CESAREAN SECTION;  Surgeon: NCaren Macadam MD;  [place] [facility]  Service: Obstetrics;  Laterality: N/A;  . EVENT MONITOR  05/2020   Rhythm was normal sinus.  Average heart rate 92 bpm, max heart rate 166 bpm with minimum heart rate of 68 bpm.  Mostly sinus rhythm.  PACs and PVCs.  No arrhythmia noted.  .  TRANSTHORACIC ECHOCARDIOGRAM  05/22/2020   EF 55 to 60%.  Normal diastolic parameters.  Normal right ventricle.  Normal valves.    Family History  Problem Relation Age of Onset  . Diabetes Mother   . Hypertension Mother   . Diabetes Father   . Hypertension Father     Social History   Tobacco Use  . Smoking status: Never Smoker  . Smokeless tobacco: Never Used  Vaping Use  . Vaping Use: Never used  Substance Use Topics  . Alcohol use: No  . Drug use: No    Allergies: No Known Allergies  Medications Prior to Admission  Medication Sig Dispense Refill Last Dose  . aspirin EC 81 MG tablet Take 1 tablet (81 mg total) by mouth daily. Start on 04/12/20 30 tablet 11 Past Month at Unknown time  . Prenatal Vit-Fe Fum-Fe Bisg-FA (NATACHEW) 28-1 MG CHEW Chew 1 tablet by mouth daily. 30 tablet 9 Past Month at Unknown time  . Blood Pressure Monitoring (BLOOD PRESSURE KIT) DEVI 1 Device by Does not apply route as needed. 1 each ea  Review of Systems  Gastrointestinal: Positive for nausea and vomiting.  All other systems reviewed and are negative.  Physical Exam   Blood pressure (!) 105/57, pulse 95, temperature 98.2 F (36.8 C), temperature source Oral, resp. rate 17, height _0  (1.473 m), weight 78.5 kg, last menstrual period 12/24/2019, SpO2 100 %, currently breastfeeding.  Physical Exam Vitals and nursing note reviewed. Exam conducted with a chaperone present.  Constitutional:      General: She is not in acute distress.    Appearance: Normal appearance. She is not ill-appearing or toxic-appearing.  Cardiovascular:     Rate and Rhythm: Normal rate.     Pulses: Normal pulses.     Heart sounds: Normal heart sounds.  Pulmonary:     Effort: Pulmonary effort is normal.     Breath sounds: Normal breath sounds.  Abdominal:     Tenderness: There is no right CVA tenderness or left CVA tenderness.     Comments: Gravid  Skin:    Capillary Refill: Capillary refill takes less than  2 seconds.  Neurological:     Mental Status: She is alert and oriented to person, place, and time.  Psychiatric:        Mood and Affect: Mood normal.        Behavior: Behavior normal.        Thought Content: Thought content normal.        Judgment: Judgment normal.     MAU Course  Procedures  --COVID +, asymptomatic --MAB infusion given in MAU --Reactive tracing: baseline 130, mod var, + 15 x 15 accels, decels at 1701, 1727 and 2005  Orders Placed This Encounter  Procedures  . Resp Panel by RT-PCR (Flu A&B, Covid) Nasopharyngeal Swab  . [place] MFM Fetal BPP Wo Non Stress  . Urinalysis, Routine w reflex microscopic Urine, Clean Catch  . CBC with Differential/Platelet  . Comprehensive metabolic panel  . Diet regular Room service appropriate? Yes; Fluid consistency: Thin  . Hypersensitivity GRADE 1: Transient flushing or rash, or drug fever < 100.4 F  . Hypersensitivity GRADE 2: Rash, flushing, urticaria, dyspnea, or drug fever = or > 100.4 F  . Hypersensitivity GRADE 3: symptomatic bronchospasm, with or without urticaria, parenteral medication management indicated, allergy-related edema/angioedema, or hypotension  . Hypersensitivity GRADE 4: Anaphylaxis  . Fetal monitoring  . Assess fetal heart tones  . Provide patient the appropriate monoclonal antibody fact sheet prior to administration  . Notify physician (specify)  . Vital signs  . Defer vaginal exam for vaginal bleeding or PROM <37 weeks  . Initiate Oral Care Protocol  . Initiate Carrier Fluid Protocol  . SCDs  . Fetal monitoring  . Bed rest with bathroom privileges  . Full code  . casirivimab / imdevimab per pharmacy consult  . Airborne and Contact precautions  . Type and screen [place]  . Insert peripheral IV  . Place in observation (patient's expected length of stay will be less than 2 midnights)   Patient Vitals for the past 24 hrs:  BP Temp Temp src Pulse Resp SpO2 Height Weight  09/17/20  1955 -- -- -- -- -- 100 % -- --  09/17/20 1952 121/80 98.6 F (37 C) Oral 98 16 -- -- --  09/17/20 1950 -- -- -- -- -- 100 % -- --  09/17/20 1945 -- -- -- -- -- 100 % -- --  09/17/20 1940 -- -- -- -- -- 99 % -- --  09/17/20 1740 -- -- -- -- --  100 % -- --  09/17/20 1735 -- -- -- -- -- 99 % -- --  09/17/20 1731 (!) 105/57 98.2 F (36.8 C) Oral 95 -- -- -- --  09/17/20 1730 -- -- -- -- -- 99 % -- --  09/17/20 1710 -- -- -- -- -- 98 % -- --  09/17/20 1705 -- -- -- -- -- 99 % -- --  09/17/20 1701 (!) 104/59 -- -- 93 -- 99 % -- --  09/17/20 1700 -- -- -- -- -- 99 % -- --  09/17/20 1655 -- -- -- -- -- 98 % -- --  09/17/20 1650 -- -- -- -- -- 100 % -- --  09/17/20 1646 103/67 -- -- (!) 124 -- -- -- --  09/17/20 1645 -- -- -- -- -- 100 % -- --  09/17/20 1640 -- -- -- -- -- 100 % -- --  09/17/20 1635 -- -- -- -- -- 99 % -- --  09/17/20 1631 (!) 89/44 -- -- (!) 103 17 -- -- --  09/17/20 1625 -- 98.5 F (36.9 C) Oral -- -- 100 % _0  (1.473 m) 78.5 kg   Results for orders placed or performed during the hospital encounter of 09/17/20 (from the past 24 hour(s))  Urinalysis, Routine w reflex microscopic Urine, Clean Catch     Status: Abnormal   Collection Time: 09/17/20  3:59 PM  Result Value Ref Range   Color, Urine AMBER (A) YELLOW   APPearance HAZY (A) CLEAR   Specific Gravity, Urine 1.025 1.005 - 1.030   pH 5.0 5.0 - 8.0   Glucose, UA NEGATIVE NEGATIVE mg/dL   Hgb urine dipstick MODERATE (A) NEGATIVE   Bilirubin Urine NEGATIVE NEGATIVE   Ketones, ur 80 (A) NEGATIVE mg/dL   Protein, ur 100 (A) NEGATIVE mg/dL   Nitrite NEGATIVE NEGATIVE   Leukocytes,Ua MODERATE (A) NEGATIVE   RBC / HPF 0-5 0 - 5 RBC/hpf   WBC, UA 0-5 0 - 5 WBC/hpf   Bacteria, UA RARE (A) NONE SEEN   Squamous Epithelial / LPF 0-5 0 - 5   Mucus PRESENT    Hyaline Casts, UA PRESENT   Resp Panel by RT-PCR (Flu A&B, Covid) Nasopharyngeal Swab     Status: Abnormal   Collection Time: 09/17/20  4:30 PM   Specimen:  Nasopharyngeal Swab; Nasopharyngeal(NP) swabs in vial transport medium  Result Value Ref Range   SARS Coronavirus 2 by RT PCR POSITIVE (A) NEGATIVE   Influenza A by PCR NEGATIVE NEGATIVE   Influenza B by PCR NEGATIVE NEGATIVE  CBC with Differential/Platelet     Status: Abnormal   Collection Time: 09/17/20  5:13 PM  Result Value Ref Range   WBC 7.6 4.0 - 10.5 K/uL   RBC 4.01 3.87 - 5.11 MIL/uL   Hemoglobin 9.7 (L) 12.0 - 15.0 g/dL   HCT 29.7 (L) 36.0 - 46.0 %   MCV 74.1 (L) 80.0 - 100.0 fL   MCH 24.2 (L) 26.0 - 34.0 pg   MCHC 32.7 30.0 - 36.0 g/dL   RDW 15.3 11.5 - 15.5 %   Platelets 254 150 - 400 K/uL   nRBC 0.0 0.0 - 0.2 %   Neutrophils Relative % 65 %   Neutro Abs 4.9 1.7 - 7.7 K/uL   Lymphocytes Relative 24 %   Lymphs Abs 1.8 0.7 - 4.0 K/uL   Monocytes Relative 8 %   Monocytes Absolute 0.6 0.1 - 1.0 K/uL   Eosinophils Relative 1 %   Eosinophils Absolute  0.1 0.0 - 0.5 K/uL   Basophils Relative 0 %   Basophils Absolute 0.0 0.0 - 0.1 K/uL   Immature Granulocytes 2 %   Abs Immature Granulocytes 0.16 (H) 0.00 - 0.07 K/uL  Comprehensive metabolic panel     Status: Abnormal   Collection Time: 09/17/20  5:13 PM  Result Value Ref Range   Sodium 136 135 - 145 mmol/L   Potassium 3.5 3.5 - 5.1 mmol/L   Chloride 104 98 - 111 mmol/L   CO2 18 (L) 22 - 32 mmol/L   Glucose, Bld 78 70 - 99 mg/dL   BUN 8 6 - 20 mg/dL   Creatinine, Ser 0.91 0.44 - 1.00 mg/dL   Calcium 9.3 8.9 - 10.3 mg/dL   Total Protein 6.7 6.5 - 8.1 g/dL   Albumin 2.4 (L) 3.5 - 5.0 g/dL   AST 35 15 - 41 U/L   ALT 17 0 - 44 U/L   Alkaline Phosphatase 111 38 - 126 U/L   Total Bilirubin 0.7 0.3 - 1.2 mg/dL   GFR, Estimated >60 >60 mL/min   Anion gap 14 5 - 15   Meds ordered this encounter  Medications  . lactated ringers bolus 1,000 mL  . multivitamins adult (INFUVITE ADULT) 10 mL in lactated ringers 1,000 mL infusion  . casirivimab (REGN 10933) 600 mg, imdevimab (REGN 10987) 600 mg in sodium chloride 0.9 % 110 mL  IVPB    Order Specific Question:   I attest that this patient meets the FDA Emergency Use Authorization criteria and has risk factor(s) for progression to severe COVID-19 and/or hospitalization:    Answer:   Yes    Order Specific Question:   High Risk Factor(s) for COVID-19 Progression:    Answer:   Pregnancy  . 0.9 %  sodium chloride infusion  . diphenhydrAMINE (BENADRYL) injection 50 mg  . famotidine (PEPCID) IVPB 20 mg premix  . methylPREDNISolone sodium succinate (SOLU-MEDROL) 125 mg/2 mL injection 125 mg   Assessment and Plan  --34 y.o. [id number] at [redacted]w[redacted]d --COVID POSITIVE, asymptomatic at time of admission --Closed cervix --BPP 8/8 --Decelerations x 3 as noted above --Report called to Dr. DRosana Hoes patient to be placed in overnight observation --Orders placed by VClois Dupes [place] CNM 09/17/2020, 8:35 PM

## 2020-09-17 NOTE — MAU Note (Signed)
Antibody infusion completed. Pt tolerated without side effect. NS at [facility] continues

## 2020-09-17 NOTE — H&P (Signed)
OB ADMISSION/ HISTORY & PHYSICAL:  Admission Date: 09/17/2020  2:43 PM  Admit Diagnosis: Covid positive  Gwendolyn Lynch is a 34 y.o. G6P5005 at [redacted]w[redacted]d who presents to MAU w/ C/O nausea and vomiting x2 days. While in MAU, pt reports loss of taste and smell about four days ago. She denies exposure to ill people. She denies fever, chills, muscle aches. She is s/p flu vaccine but did not receive a COVID vaccine. Positive Covid results- treated in MAU w/ Monoclonal antibodies, IV  multivitamins, and steroids. [casirivimab (REGN 10933) 600 mg, imdevimab (REGN 10987) 600 mg and Solumedrol 125 mg/2 mL]  BPP 8/8, but variable decels x3 noted. Will admit to OBS for continued fetal surveillance.    History of current pregnancy: [id number]   Patient transferred care from [place] for [facility] and  entered care with [facility] at 24+5 wks.   EDC  by [place] @ 7+1 Anatomy scan:  22+5 wks, complete w/ posterior placenta.   Hx of C/S x4, repeat sched for 10/11/20 w/ cell saver Antenatal testing: N/A Last evaluation: 33  wks Vertex/ posterior placenta/ AFI 15.7 BPP 8/8 in 10 min Significant prenatal events:  Patient Active Problem List   Diagnosis Date Noted  . COVID-19 virus infection 09/17/2020  . Antepartum variable deceleration 09/17/2020  . Hyperemesis gravidarum 05/17/2020  . Near syncope 05/17/2020  . Hypertension affecting pregnancy, antepartum 05/03/2020  . BMI 32.0-32.9,adult 04/05/2020  . Supervision of high risk pregnancy, antepartum 03/19/2020  . Obesity affecting pregnancy 03/19/2020  . Hypertension   . History of cesarean delivery, currently pregnant 04/22/2017  . Thalassemia carrier 12/07/2016    Prenatal Labs: ABO, Rh: O/Positive/-- (07/01 1021) Antibody: Negative (07/01 1021) Rubella: 3.40 (07/01 1021)  RPR: Non Reactive (07/01 1021)  HBsAg: Negative (07/01 1021)  HIV: Non Reactive (07/01 1021)  GTT: passed 1 hr GBS:   PCR pending GC/CHL: pending Genetics: normal Horizon and  AFP Tdap/influenza vaccines: UTD on both   OB History  Gravida Para Term Preterm AB Living  $Remov'6 5 5 'diTOTT$ 0 0 5  SAB IAB Ectopic Multiple Live Births  0 0 0 0 5    # Outcome Date GA Lbr Len/2nd Weight Sex Delivery Anes PTL Lv  6 Current           5 Term 04/22/17 [redacted]w[redacted]d  3605 g M CS-LTranv Spinal  LIV     Birth Comments: HTN not on meds  4 Term 11/01/15 [redacted]w[redacted]d  3510 g M CS-LTranv Spinal  LIV  3 Term 2012 [redacted]w[redacted]d  2722 g M CS-LTranv Spinal N LIV  2 Term 2009 [redacted]w[redacted]d  2580 g M CS-LTranv EPI N LIV     Birth Comments: IOL, failed IOL   1 Term 2005 [redacted]w[redacted]d  2495 g M Vag-Spont EPI  LIV    Medical / Surgical History: Past medical history:  Past Medical History:  Diagnosis Date  . Anemia   . Hemorrhoids   . Hyperemesis gravidarum   . Hypertension affecting pregnancy    Has had hypertension during previous pregnancies  . Tachycardia   . Thalassemia carrier     Past surgical history:  Past Surgical History:  Procedure Laterality Date  . CESAREAN SECTION     C/S x 3  . CESAREAN SECTION N/A 11/01/2015   Procedure: CESAREAN SECTION;  Surgeon: Crawford Givens, MD;  Location: [facility];  Service: Obstetrics;  Laterality: N/A;  . CESAREAN SECTION N/A 04/22/2017   Procedure: REPEAT CESAREAN SECTION;  Surgeon: Caren Macadam, MD;  Location: [facility];  Service: Obstetrics;  Laterality: N/A;  . EVENT MONITOR  05/2020   Rhythm was normal sinus.  Average heart rate 92 bpm, max heart rate 166 bpm with minimum heart rate of 68 bpm.  Mostly sinus rhythm.  PACs and PVCs.  No arrhythmia noted.  . TRANSTHORACIC ECHOCARDIOGRAM  05/22/2020   EF 55 to 60%.  Normal diastolic parameters.  Normal right ventricle.  Normal valves.   Family History:  Family History  Problem Relation Age of Onset  . Diabetes Mother   . Hypertension Mother   . Diabetes Father   . Hypertension Father     Social History:  reports that she has never smoked. She has never used smokeless tobacco. She reports that she does not  drink alcohol and does not use drugs.  Allergies: Patient has no known allergies.   Current Medications at time of admission:  Prior to Admission medications   Medication Sig Start Date End Date Taking? Authorizing Provider  aspirin EC 81 MG tablet Take 1 tablet (81 mg total) by mouth daily. Start on 04/12/20 03/22/20  Yes Woodroe Mode, MD  Prenatal Vit-Fe Fum-Fe Bisg-FA (NATACHEW) 28-1 MG CHEW Chew 1 tablet by mouth daily. 03/19/20  Yes Woodroe Mode, MD  Blood Pressure Monitoring (BLOOD PRESSURE KIT) DEVI 1 Device by Does not apply route as needed. 03/19/20   Woodroe Mode, MD    Review of Systems: Constitutional: Negative   HENT: Negative   Eyes: Negative   Respiratory: Negative   Cardiovascular: Negative   Gastrointestinal: Negative  Genitourinary: neg for bloody show, neg for LOF   Musculoskeletal: Negative   Skin: Negative   Neurological: Negative   Endo/Heme/Allergies: Negative   Psychiatric/Behavioral: Negative    Physical Exam: VS: Blood pressure 121/80, pulse 98, temperature 98.6 F (37 C), temperature source Oral, resp. rate 16, height $RemoveBe'4\' 10"'rdfrHusvO$  (1.473 m), weight 78.5 kg, last menstrual period 12/24/2019, SpO2 100 %, currently breastfeeding.  Cervical exam: deferred FHR: baseline rate 125 / variability moderate / accelerations present / absnet decelerations TOCO: none   Prenatal Transfer Tool  Maternal Diabetes: No Genetic Screening: Normal Maternal Ultrasounds/Referrals: Normal Fetal Ultrasounds or other Referrals:  None Maternal Substance Abuse:  No Significant Maternal Medications:  None Significant Maternal Lab Results: Other: GBS unknown, results pending    Assessment: 34 y.o. G6Y6948 [redacted]w[redacted]d  Covid positive    -treated w/ monoclonal antibodies, IV multivitamins, and steroids Chronic HTN    -no meds    -normotensive Variable decel    -x3 in MAU    -BPP 8/8 on 12/27  FHR category 1    -antenatal steroids GBS-pending   Plan:  Admit to  Antepartum Routine admission orders Contact and airborne precautions GBS/GC.Chlamydia pending  Dr Delora Fuel notified of admission and plan of care  Arrie Eastern MSN, CNM 09/17/2020 9:00 PM

## 2020-09-17 NOTE — MAU Note (Signed)
Ultrasound completed external fetal and labor monitors reapplied

## 2020-09-17 NOTE — MAU Note (Signed)
Pt arrived with complaints of unable to eat or drink without nausea or vomiting x 4 days as well as loss of taste and smell. Pt denies known exposure to covid. Pt denies fever, chills, cough or SOB. Endorses + fetal movement. Denies SROM, vaginal bleeding or bloody show but states she has a vaginal discharge with itching and burning without odor. Reports irregular cramping and contractions. States she was told they were Deberah Pelton

## 2020-09-18 ENCOUNTER — Encounter (HOSPITAL_COMMUNITY): Payer: Self-pay | Admitting: Obstetrics and Gynecology

## 2020-09-18 DIAGNOSIS — R112 Nausea with vomiting, unspecified: Secondary | ICD-10-CM | POA: Diagnosis present

## 2020-09-18 LAB — GC/CHLAMYDIA PROBE AMP (~~LOC~~) NOT AT ARMC
Chlamydia: NEGATIVE
Comment: NEGATIVE
Comment: NORMAL
Neisseria Gonorrhea: NEGATIVE

## 2020-09-18 LAB — GROUP B STREP BY PCR: Group B strep by PCR: NEGATIVE

## 2020-09-18 NOTE — Plan of Care (Signed)

## 2020-09-18 NOTE — Discharge Summary (Addendum)
Antepartum Discharge Summary     Patient Name: Gwendolyn Lynch DOB: October 07, 1985 MRN: [id number]  Date of admission: 09/17/2020 Delivering MD: This patient has no babies on file. Date of discharge: 09/18/2020  Admitting diagnosis: Antepartum variable deceleration [O36.8390] Intrauterine pregnancy: [redacted]w[redacted]d     Secondary diagnosis:  Active Problems:   COVID-19 virus infection   Antepartum variable deceleration   Nausea and vomiting                     History of Present Illness: [name] is a 34 y.o. female, [id number], who presents at [redacted]w[redacted]d weeks gestation. The patient has been followed at  [facility]  Her pregnancy has been complicated by:  Patient Active Problem List   Diagnosis Date Noted   Nausea and vomiting 09/18/2020   COVID-19 virus infection 09/17/2020   Antepartum variable deceleration 09/17/2020   Hyperemesis gravidarum 05/17/2020   Near syncope 05/17/2020   Hypertension affecting pregnancy, antepartum 05/03/2020   BMI 32.0-32.9,adult 04/05/2020   Supervision of high risk pregnancy, antepartum 03/19/2020   Obesity affecting pregnancy 03/19/2020   Hypertension    History of cesarean delivery, currently pregnant 04/22/2017   Thalassemia carrier 12/07/2016    Hospital course:   09/17/2017: MAU HPI: HPI Gwendolyn Lynch is a 34 y.o. [id number] at [redacted]w[redacted]d who presents to MAU with chief complaint of nausea and vomiting for the past two days. She states she has been unable to tolerate anything PO in that time.    Patient endorses loss of taste and smell about four days ago. She denies exposure to ill people. She denies fever, chills, muscle aches. She is s/p flu vaccine but did not receive a COVID vaccine.   She denies pain, vaginal bleeding, leaking of fluid, DFM, fever or recent illness.  Per HP 12/28: Gwendolyn Lynch is a 34 y.o. [id number] at [redacted]w[redacted]d who presents to MAU w/ C/O nausea and vomiting x2 days. While in MAU, pt reports  loss of taste and smell about four days ago. She denies exposure to ill people. She denies fever, chills, muscle aches. She is s/p flu vaccine but did not receive a COVID vaccine. Positive Covid results- treated in MAU w/ Monoclonal antibodies, IV  multivitamins, and steroids. [casirivimab (REGN 10933) 600 mg, imdevimab (REGN 10987) 600 mg and Solumedrol 125 mg/2 mL]   BPP 8/8, but variable decels x3 noted. Will admit to OBS for continued fetal surveillance.   Today: NST reactive and stable, Cat 1, pt denies N/V/A?SOBV, CP< or coughing, no URI s/sx curently, pt endorses feeling better and stable to go home. Consulted hospitals in regards to covid positive but asymptomatic now, pt being discharged to home with supportive care, mucinex if cough present, increased fluids, tylenol for fever, get pulse o2 and monitor 4 times daily for 2 mins at a time, if <93% contact [facility],m pt to follow up in 1 week with telepath, pt verbalized understanding.   FHR: baseline rate 125 / variability moderate / accelerations present / absnet decelerations TOCO: none NST reactive.   Physical exam  Vitals:   09/18/20 0058 09/18/20 0447 09/18/20 0836 09/18/20 1156  BP: 105/67 114/69 99/61 (!) 96/57  Pulse:  92 94 99  Resp: $Remo'18 18 18 18  'jbszJ$ Temp: 98.2 F (36.8 C) 98 F (36.7 C) 98.2 F (36.8 C) 98 F (36.7 C)  TempSrc: Oral Oral Oral Oral  SpO2: 100% 98% 100% 100%  Weight:  Height:       Physical Exam Vitals and nursing note reviewed. Exam conducted with a chaperone present.  Constitutional:      General: She is not in acute distress.    Appearance: Normal appearance. She is not ill-appearing or toxic-appearing.  Cardiovascular:     Rate and Rhythm: Normal rate.     Pulses: Normal pulses.     Heart sounds: Normal heart sounds.  Pulmonary:     Effort: Pulmonary effort is normal.     Breath sounds: Normal breath sounds.  Abdominal:     Tenderness: There is no right CVA tenderness or left CVA tenderness.      Comments: Gravid  Skin:    Capillary Refill: Capillary refill takes less than 2 seconds.  Neurological:     Mental Status: She is alert and oriented to person, place, and time.  Psychiatric:        Mood and Affect: Mood normal.        Behavior: Behavior normal.        Thought Content: Thought content normal.        Judgment: Judgment normal.   Labs: Lab Results  Component Value Date   WBC 7.6 09/17/2020   HGB 9.7 (L) 09/17/2020   HCT 29.7 (L) 09/17/2020   MCV 74.1 (L) 09/17/2020   PLT 254 09/17/2020   CMP Latest Ref Rng & Units 09/17/2020  Glucose 70 - 99 mg/dL 78  BUN 6 - 20 mg/dL 8  Creatinine 0.44 - 1.00 mg/dL 0.91  Sodium 135 - 145 mmol/L 136  Potassium 3.5 - 5.1 mmol/L 3.5  Chloride 98 - 111 mmol/L 104  CO2 22 - 32 mmol/L 18(L)  Calcium 8.9 - 10.3 mg/dL 9.3  Total Protein 6.5 - 8.1 g/dL 6.7  Total Bilirubin 0.3 - 1.2 mg/dL 0.7  Alkaline Phos 38 - 126 U/L 111  AST 15 - 41 U/L 35  ALT 0 - 44 U/L 17    Date of discharge: 09/18/2020 Discharge Diagnoses:  Covid+ with N/V Discharge instruction: per After Visit Summary and "Baby and Me Booklet".  After visit meds:   Activity:           unrestricted Advance as tolerated. Pelvic rest for 6 weeks.  Diet:                routine Medications: PNV Condition:  Pt discharge to home stable and condition Covid: See above outlined details for home care.   Meds: Allergies as of 09/18/2020   No Known Allergies      Medication List     TAKE these medications    aspirin EC 81 MG tablet Take 1 tablet (81 mg total) by mouth daily. Start on 04/12/20   Blood Pressure Kit Devi 1 Device by Does not apply route as needed.   NataChew 28-1 MG Chew Chew 1 tablet by mouth daily.        Discharge Follow Up:   Follow-up [facility]. Schedule an appointment as soon as possible for a visit in 1 week(s).   Specialty: Obstetrics and Gynecology Why: 1 week telhealth visit Contact  information: [address] [place]  [id number] [place], NP-C, CNM 09/18/2020, 1:38 PM  Noralyn Pick, FNP  MD Addendum: On day of discharge patient reported feeling better and had no respiratory symptoms, no chest  pain or shortness of breath.  Patient was discussed with Internal medicine on call Dr. Myrtie Hawk who consulted with Infectious Disease attending and recommendations for outpatient management with isolation for at least 10 days and COVID precautions were advised for this patient.   I reviewed patient's NST before discharge and noted a category 1 tracing with normal baseline, moderate variability and reactive tracing and no contractions on the TOCO.    Dr. Alesia Richards. 09/18/2020.

## 2020-09-27 ENCOUNTER — Telehealth (HOSPITAL_COMMUNITY): Payer: Self-pay | Admitting: *Deleted

## 2020-09-27 NOTE — Telephone Encounter (Signed)
Preadmission screen  

## 2020-09-27 NOTE — Patient Instructions (Signed)
Gwendolyn Lynch  09/27/2020   Your procedure is scheduled on:  10/11/2020  Arrive at 0730 at [employer] [facility] on [address] at [facility]  and [employer]. You are invited to use the FREE valet parking or use the Visitor's parking deck.  Pick up the phone at the desk and dial [phone].  Call this number if you have problems the morning of surgery: [phone]  Remember:   Do not eat food:(After Midnight) Desps de medianoche.  Do not drink clear liquids: (After Midnight) Desps de medianoche.  Take these medicines the morning of surgery with A SIP OF WATER:  none   Do not wear jewelry, make-up or nail polish.  Do not wear lotions, powders, or perfumes. Do not wear deodorant.  Do not shave 48 hours prior to surgery.  Do not bring valuables to the hospital.  [facility] is not   responsible for any belongings or valuables brought to the hospital.  Contacts, dentures or bridgework may not be worn into surgery.  Leave suitcase in the car. After surgery it may be brought to your room.  For patients admitted to the hospital, checkout time is 11:00 AM the day of              discharge.      Please read over the following fact sheets that you were given:     Preparing for Surgery

## 2020-09-28 ENCOUNTER — Encounter: Payer: Self-pay | Admitting: Obstetrics and Gynecology

## 2020-09-28 ENCOUNTER — Telehealth (HOSPITAL_COMMUNITY): Payer: Self-pay | Admitting: *Deleted

## 2020-09-28 NOTE — Telephone Encounter (Signed)
Preadmission screen  

## 2020-10-01 ENCOUNTER — Telehealth (HOSPITAL_COMMUNITY): Payer: Self-pay | Admitting: *Deleted

## 2020-10-01 NOTE — Telephone Encounter (Signed)
Preadmission screen  

## 2020-10-02 ENCOUNTER — Encounter (HOSPITAL_COMMUNITY): Payer: Self-pay

## 2020-10-04 ENCOUNTER — Other Ambulatory Visit: Payer: Self-pay | Admitting: Obstetrics and Gynecology

## 2020-10-04 ENCOUNTER — Other Ambulatory Visit: Payer: Self-pay

## 2020-10-04 ENCOUNTER — Other Ambulatory Visit (HOSPITAL_COMMUNITY): Payer: Self-pay | Admitting: Obstetrics and Gynecology

## 2020-10-04 ENCOUNTER — Ambulatory Visit: Payer: Medicaid Other | Attending: Obstetrics and Gynecology

## 2020-10-04 ENCOUNTER — Encounter: Payer: Self-pay | Admitting: *Deleted

## 2020-10-04 ENCOUNTER — Ambulatory Visit: Payer: Medicaid Other | Admitting: *Deleted

## 2020-10-04 ENCOUNTER — Other Ambulatory Visit: Payer: Self-pay | Admitting: *Deleted

## 2020-10-04 DIAGNOSIS — O169 Unspecified maternal hypertension, unspecified trimester: Secondary | ICD-10-CM | POA: Insufficient documentation

## 2020-10-04 DIAGNOSIS — Z3A37 37 weeks gestation of pregnancy: Secondary | ICD-10-CM | POA: Diagnosis not present

## 2020-10-04 DIAGNOSIS — O1213 Gestational proteinuria, third trimester: Secondary | ICD-10-CM

## 2020-10-04 DIAGNOSIS — Z148 Genetic carrier of other disease: Secondary | ICD-10-CM | POA: Diagnosis not present

## 2020-10-04 DIAGNOSIS — O10913 Unspecified pre-existing hypertension complicating pregnancy, third trimester: Secondary | ICD-10-CM

## 2020-10-04 DIAGNOSIS — O368190 Decreased fetal movements, unspecified trimester, not applicable or unspecified: Secondary | ICD-10-CM | POA: Diagnosis present

## 2020-10-04 DIAGNOSIS — O365930 Maternal care for other known or suspected poor fetal growth, third trimester, not applicable or unspecified: Secondary | ICD-10-CM | POA: Insufficient documentation

## 2020-10-04 DIAGNOSIS — O0990 Supervision of high risk pregnancy, unspecified, unspecified trimester: Secondary | ICD-10-CM

## 2020-10-04 DIAGNOSIS — O4103X0 Oligohydramnios, third trimester, not applicable or unspecified: Secondary | ICD-10-CM

## 2020-10-09 ENCOUNTER — Ambulatory Visit: Payer: Medicaid Other

## 2020-10-09 ENCOUNTER — Other Ambulatory Visit: Payer: Self-pay

## 2020-10-09 ENCOUNTER — Encounter (HOSPITAL_COMMUNITY): Payer: Self-pay | Admitting: Obstetrics & Gynecology

## 2020-10-09 ENCOUNTER — Encounter (HOSPITAL_COMMUNITY)
Admission: RE | Admit: 2020-10-09 | Discharge: 2020-10-09 | Disposition: A | Discharge status: Home / Self Care | Payer: Medicaid Other | Source: Ambulatory Visit | Attending: Obstetrics & Gynecology | Admitting: Obstetrics & Gynecology

## 2020-10-09 DIAGNOSIS — Z01812 Encounter for preprocedural laboratory examination: Secondary | ICD-10-CM | POA: Diagnosis not present

## 2020-10-09 LAB — BASIC METABOLIC PANEL
Anion gap: 10 (ref 5–15)
BUN: <5 mg/dL — ABNORMAL LOW (ref 6–20)
CO2: 23 mmol/L (ref 22–32)
Calcium: 8.8 mg/dL — ABNORMAL LOW (ref 8.9–10.3)
Chloride: 104 mmol/L (ref 98–111)
Creatinine, Ser: 0.61 mg/dL (ref 0.44–1.00)
GFR, Estimated: >60 mL/min (ref >60)
Glucose, Bld: 80 mg/dL (ref 70–99)
Potassium: 3.7 mmol/L (ref 3.5–5.1)
Sodium: 137 mmol/L (ref 135–145)

## 2020-10-09 LAB — CBC
HCT: 28.2 % — ABNORMAL LOW (ref 36.0–46.0)
Hemoglobin: 8.5 g/dL — ABNORMAL LOW (ref 12.0–15.0)
MCH: 22.6 pg — ABNORMAL LOW (ref 26.0–34.0)
MCHC: 30.1 g/dL (ref 30.0–36.0)
MCV: 75 fL — ABNORMAL LOW (ref 80.0–100.0)
Platelets: 224 10*3/uL (ref 150–400)
RBC: 3.76 MIL/uL — ABNORMAL LOW (ref 3.87–5.11)
RDW: 16.7 % — ABNORMAL HIGH (ref 11.5–15.5)
WBC: 10.1 10*3/uL (ref 4.0–10.5)
nRBC: 0.5 % — ABNORMAL HIGH (ref 0.0–0.2)

## 2020-10-10 LAB — RPR: RPR Ser Ql: NONREACTIVE

## 2020-10-11 ENCOUNTER — Inpatient Hospital Stay (HOSPITAL_COMMUNITY)
Admission: RE | Admit: 2020-10-11 | Discharge: 2020-10-13 | DRG: 788 | Disposition: A | Discharge status: Home / Self Care | Payer: Medicaid Other | Attending: Obstetrics & Gynecology | Admitting: Obstetrics & Gynecology

## 2020-10-11 ENCOUNTER — Encounter (HOSPITAL_COMMUNITY): Payer: Self-pay | Admitting: Anesthesiology

## 2020-10-11 ENCOUNTER — Encounter (HOSPITAL_COMMUNITY): Payer: Self-pay | Admitting: Obstetrics & Gynecology

## 2020-10-11 ENCOUNTER — Encounter (HOSPITAL_COMMUNITY)
Admission: RE | Disposition: A | Discharge status: Home / Self Care | Payer: Self-pay | Source: Home / Self Care | Attending: Obstetrics & Gynecology

## 2020-10-11 ENCOUNTER — Inpatient Hospital Stay (HOSPITAL_COMMUNITY): Payer: Medicaid Other | Admitting: Anesthesiology

## 2020-10-11 ENCOUNTER — Other Ambulatory Visit: Payer: Self-pay

## 2020-10-11 DIAGNOSIS — O1002 Pre-existing essential hypertension complicating childbirth: Principal | ICD-10-CM | POA: Diagnosis present

## 2020-10-11 DIAGNOSIS — D509 Iron deficiency anemia, unspecified: Secondary | ICD-10-CM | POA: Diagnosis present

## 2020-10-11 DIAGNOSIS — Z3A38 38 weeks gestation of pregnancy: Secondary | ICD-10-CM

## 2020-10-11 DIAGNOSIS — O34211 Maternal care for low transverse scar from previous cesarean delivery: Secondary | ICD-10-CM | POA: Diagnosis present

## 2020-10-11 DIAGNOSIS — Z30017 Encounter for initial prescription of implantable subdermal contraceptive: Secondary | ICD-10-CM | POA: Diagnosis not present

## 2020-10-11 DIAGNOSIS — D569 Thalassemia, unspecified: Secondary | ICD-10-CM | POA: Diagnosis present

## 2020-10-11 DIAGNOSIS — O9902 Anemia complicating childbirth: Secondary | ICD-10-CM | POA: Diagnosis present

## 2020-10-11 DIAGNOSIS — O26893 Other specified pregnancy related conditions, third trimester: Secondary | ICD-10-CM | POA: Diagnosis present

## 2020-10-11 DIAGNOSIS — Z8616 Personal history of COVID-19: Secondary | ICD-10-CM

## 2020-10-11 DIAGNOSIS — O365930 Maternal care for other known or suspected poor fetal growth, third trimester, not applicable or unspecified: Secondary | ICD-10-CM | POA: Diagnosis present

## 2020-10-11 DIAGNOSIS — O34219 Maternal care for unspecified type scar from previous cesarean delivery: Secondary | ICD-10-CM | POA: Diagnosis present

## 2020-10-11 LAB — PREPARE RBC (CROSSMATCH)

## 2020-10-11 SURGERY — Surgical Case
Anesthesia: Spinal

## 2020-10-11 MED ORDER — LACTATED RINGERS IR SOLN
Status: DC | PRN
  Administered 2020-10-11: 1000 mL

## 2020-10-11 MED ORDER — FENTANYL CITRATE (PF) 100 MCG/2ML IJ SOLN: 25.0000 ug | INTRAMUSCULAR | Status: DC | PRN

## 2020-10-11 MED ORDER — OXYCODONE HCL 5 MG PO TABS
5.0000 mg | ORAL_TABLET | ORAL | Status: DC | PRN
  Administered 2020-10-12 – 2020-10-13 (×2): 5 mg via ORAL
  Filled 2020-10-11 (×2): qty 1

## 2020-10-11 MED ORDER — LACTATED RINGERS IV SOLN: INTRAVENOUS | Status: DC | PRN

## 2020-10-11 MED ORDER — OXYTOCIN-SODIUM CHLORIDE 30-0.9 UT/500ML-% IV SOLN
INTRAVENOUS | Status: AC
  Filled 2020-10-11: qty 500

## 2020-10-11 MED ORDER — BUPIVACAINE IN DEXTROSE 0.75-8.25 % IT SOLN
INTRATHECAL | Status: AC
  Filled 2020-10-11: qty 2

## 2020-10-11 MED ORDER — SIMETHICONE 80 MG PO CHEW
80.0000 mg | CHEWABLE_TABLET | Freq: Three times a day (TID) | ORAL | Status: DC
  Administered 2020-10-11 – 2020-10-13 (×5): 80 mg via ORAL
  Filled 2020-10-11 (×6): qty 1

## 2020-10-11 MED ORDER — KETOROLAC TROMETHAMINE 30 MG/ML IJ SOLN
INTRAMUSCULAR | Status: AC
  Filled 2020-10-11: qty 1

## 2020-10-11 MED ORDER — OXYCODONE HCL 5 MG PO TABS
5.0000 mg | ORAL_TABLET | Freq: Once | ORAL | Status: DC | PRN
Start: 2020-10-11 — End: 2020-10-11

## 2020-10-11 MED ORDER — AMISULPRIDE (ANTIEMETIC) 5 MG/2ML IV SOLN: 10.0000 mg | Freq: Once | INTRAVENOUS | Status: DC | PRN

## 2020-10-11 MED ORDER — SCOPOLAMINE 1 MG/3DAYS TD PT72
MEDICATED_PATCH | TRANSDERMAL | Status: AC
  Filled 2020-10-11: qty 1

## 2020-10-11 MED ORDER — KETOROLAC TROMETHAMINE 30 MG/ML IJ SOLN
30.0000 mg | Freq: Four times a day (QID) | INTRAMUSCULAR | Status: AC | PRN
  Administered 2020-10-11: 30 mg via INTRAVENOUS
  Filled 2020-10-11: qty 1

## 2020-10-11 MED ORDER — FENTANYL CITRATE (PF) 100 MCG/2ML IJ SOLN
INTRAMUSCULAR | Status: AC
  Filled 2020-10-11: qty 2

## 2020-10-11 MED ORDER — MENTHOL 3 MG MT LOZG: 1.0000 | LOZENGE | OROMUCOSAL | Status: DC | PRN

## 2020-10-11 MED ORDER — OXYTOCIN-SODIUM CHLORIDE 30-0.9 UT/500ML-% IV SOLN: 2.5000 [IU]/h | INTRAVENOUS | Status: AC

## 2020-10-11 MED ORDER — NALBUPHINE HCL 10 MG/ML IJ SOLN
5.0000 mg | Freq: Once | INTRAMUSCULAR | Status: AC | PRN
Start: 2020-10-11 — End: 2020-10-12

## 2020-10-11 MED ORDER — TRANEXAMIC ACID-NACL 1000-0.7 MG/100ML-% IV SOLN
INTRAVENOUS | Status: DC | PRN
  Administered 2020-10-11: 1000 mg via INTRAVENOUS

## 2020-10-11 MED ORDER — SENNOSIDES-DOCUSATE SODIUM 8.6-50 MG PO TABS
2.0000 | ORAL_TABLET | Freq: Every day | ORAL | Status: DC
  Administered 2020-10-12 – 2020-10-13 (×2): 2 via ORAL
  Filled 2020-10-11 (×2): qty 2

## 2020-10-11 MED ORDER — TETANUS-DIPHTH-ACELL PERTUSSIS 5-2.5-18.5 LF-MCG/0.5 IM SUSY: 0.5000 mL | PREFILLED_SYRINGE | Freq: Once | INTRAMUSCULAR | Status: DC

## 2020-10-11 MED ORDER — NALBUPHINE HCL 10 MG/ML IJ SOLN
5.0000 mg | INTRAMUSCULAR | Status: DC | PRN
  Administered 2020-10-11: 5 mg via INTRAVENOUS
  Filled 2020-10-11 (×2): qty 1

## 2020-10-11 MED ORDER — MEPERIDINE HCL 25 MG/ML IJ SOLN: 6.2500 mg | INTRAMUSCULAR | Status: DC | PRN

## 2020-10-11 MED ORDER — DEXAMETHASONE SODIUM PHOSPHATE 4 MG/ML IJ SOLN
INTRAMUSCULAR | Status: DC | PRN
  Administered 2020-10-11: 4 mg via INTRAVENOUS

## 2020-10-11 MED ORDER — DEXAMETHASONE SODIUM PHOSPHATE 4 MG/ML IJ SOLN
INTRAMUSCULAR | Status: AC
  Filled 2020-10-11: qty 2

## 2020-10-11 MED ORDER — MORPHINE SULFATE (PF) 0.5 MG/ML IJ SOLN
INTRAMUSCULAR | Status: AC
  Filled 2020-10-11: qty 10

## 2020-10-11 MED ORDER — WITCH HAZEL-GLYCERIN EX PADS: 1.0000 "application " | MEDICATED_PAD | CUTANEOUS | Status: DC | PRN

## 2020-10-11 MED ORDER — BUPIVACAINE IN DEXTROSE 0.75-8.25 % IT SOLN
INTRATHECAL | Status: DC | PRN
  Administered 2020-10-11: 1.6 mL via INTRATHECAL

## 2020-10-11 MED ORDER — ZOLPIDEM TARTRATE 5 MG PO TABS
5.0000 mg | ORAL_TABLET | Freq: Every evening | ORAL | Status: DC | PRN
Start: 2020-10-11 — End: 2020-10-13

## 2020-10-11 MED ORDER — ONDANSETRON HCL 4 MG/2ML IJ SOLN
INTRAMUSCULAR | Status: AC
  Filled 2020-10-11: qty 2

## 2020-10-11 MED ORDER — NALOXONE HCL 0.4 MG/ML IJ SOLN: 0.4000 mg | INTRAMUSCULAR | Status: DC | PRN

## 2020-10-11 MED ORDER — LACTATED RINGERS IV SOLN: INTRAVENOUS | Status: DC

## 2020-10-11 MED ORDER — SODIUM CHLORIDE 0.9% FLUSH: 3.0000 mL | INTRAVENOUS | Status: DC | PRN

## 2020-10-11 MED ORDER — FENTANYL CITRATE (PF) 100 MCG/2ML IJ SOLN: 12.5000 ug | INTRAMUSCULAR | Status: DC | PRN

## 2020-10-11 MED ORDER — PRENATAL MULTIVITAMIN CH
1.0000 | ORAL_TABLET | Freq: Every day | ORAL | Status: DC
  Administered 2020-10-12: 1 via ORAL
  Filled 2020-10-11: qty 1

## 2020-10-11 MED ORDER — CEFAZOLIN SODIUM-DEXTROSE 2-4 GM/100ML-% IV SOLN
2.0000 g | INTRAVENOUS | Status: AC
  Administered 2020-10-11: 2 g via INTRAVENOUS

## 2020-10-11 MED ORDER — ONDANSETRON HCL 4 MG/2ML IJ SOLN: 4.0000 mg | Freq: Once | INTRAMUSCULAR | Status: DC | PRN

## 2020-10-11 MED ORDER — SIMETHICONE 80 MG PO CHEW
80.0000 mg | CHEWABLE_TABLET | ORAL | Status: DC | PRN
  Administered 2020-10-12 – 2020-10-13 (×2): 80 mg via ORAL
  Filled 2020-10-11 (×2): qty 1

## 2020-10-11 MED ORDER — PROPOFOL 10 MG/ML IV BOLUS
INTRAVENOUS | Status: AC
  Filled 2020-10-11: qty 20

## 2020-10-11 MED ORDER — FENTANYL CITRATE (PF) 100 MCG/2ML IJ SOLN
INTRAMUSCULAR | Status: DC | PRN
  Administered 2020-10-11: 50 ug via INTRAVENOUS
  Administered 2020-10-11: 35 ug via INTRAVENOUS

## 2020-10-11 MED ORDER — NALBUPHINE HCL 10 MG/ML IJ SOLN
5.0000 mg | Freq: Once | INTRAMUSCULAR | Status: AC | PRN
Start: 2020-10-11 — End: 2020-10-12
  Administered 2020-10-12: 5 mg via INTRAVENOUS

## 2020-10-11 MED ORDER — ACETAMINOPHEN 10 MG/ML IV SOLN
INTRAVENOUS | Status: AC
  Filled 2020-10-11: qty 100

## 2020-10-11 MED ORDER — MORPHINE SULFATE (PF) 0.5 MG/ML IJ SOLN
INTRAMUSCULAR | Status: DC | PRN
  Administered 2020-10-11: .15 mg via INTRATHECAL

## 2020-10-11 MED ORDER — OXYCODONE HCL 5 MG/5ML PO SOLN: 5.0000 mg | Freq: Once | ORAL | Status: DC | PRN

## 2020-10-11 MED ORDER — ONDANSETRON HCL 4 MG/2ML IJ SOLN
INTRAMUSCULAR | Status: DC | PRN
  Administered 2020-10-11: 4 mg via INTRAVENOUS

## 2020-10-11 MED ORDER — PHENYLEPHRINE HCL-NACL 20-0.9 MG/250ML-% IV SOLN
INTRAVENOUS | Status: AC
  Filled 2020-10-11: qty 250

## 2020-10-11 MED ORDER — BUPIVACAINE HCL 0.5 % IJ SOLN
INTRAMUSCULAR | Status: DC | PRN
  Administered 2020-10-11: 30 mL

## 2020-10-11 MED ORDER — OXYTOCIN-SODIUM CHLORIDE 30-0.9 UT/500ML-% IV SOLN
INTRAVENOUS | Status: DC | PRN
  Administered 2020-10-11: 300 mL via INTRAVENOUS
  Administered 2020-10-11: 50 mL via INTRAVENOUS

## 2020-10-11 MED ORDER — ACETAMINOPHEN 160 MG/5ML PO SOLN
1000.0000 mg | Freq: Four times a day (QID) | ORAL | Status: DC
  Administered 2020-10-12 – 2020-10-13 (×7): 1000 mg via ORAL
  Filled 2020-10-11 (×7): qty 40.6

## 2020-10-11 MED ORDER — DIPHENHYDRAMINE HCL 25 MG PO CAPS: 25.0000 mg | ORAL_CAPSULE | Freq: Four times a day (QID) | ORAL | Status: DC | PRN

## 2020-10-11 MED ORDER — DIPHENHYDRAMINE HCL 50 MG/ML IJ SOLN: 12.5000 mg | INTRAMUSCULAR | Status: DC | PRN

## 2020-10-11 MED ORDER — SCOPOLAMINE 1 MG/3DAYS TD PT72: 1.0000 | MEDICATED_PATCH | Freq: Once | TRANSDERMAL | Status: DC

## 2020-10-11 MED ORDER — SODIUM CHLORIDE 0.9 % IV SOLN: INTRAVENOUS | Status: DC | PRN

## 2020-10-11 MED ORDER — DIPHENHYDRAMINE HCL 25 MG PO CAPS: 25.0000 mg | ORAL_CAPSULE | ORAL | Status: DC | PRN

## 2020-10-11 MED ORDER — ONDANSETRON HCL 4 MG/2ML IJ SOLN: 4.0000 mg | Freq: Three times a day (TID) | INTRAMUSCULAR | Status: DC | PRN

## 2020-10-11 MED ORDER — CEFAZOLIN SODIUM-DEXTROSE 2-4 GM/100ML-% IV SOLN
INTRAVENOUS | Status: AC
  Filled 2020-10-11: qty 100

## 2020-10-11 MED ORDER — PHENYLEPHRINE 40 MCG/ML (10ML) SYRINGE FOR IV PUSH (FOR BLOOD PRESSURE SUPPORT)
PREFILLED_SYRINGE | INTRAVENOUS | Status: AC
  Filled 2020-10-11: qty 10

## 2020-10-11 MED ORDER — POVIDONE-IODINE 10 % EX SWAB
2.0000 "application " | Freq: Once | CUTANEOUS | Status: AC
  Administered 2020-10-11: 2 via TOPICAL

## 2020-10-11 MED ORDER — BUPIVACAINE HCL (PF) 0.5 % IJ SOLN
INTRAMUSCULAR | Status: AC
  Filled 2020-10-11: qty 30

## 2020-10-11 MED ORDER — EPINEPHRINE PF 1 MG/ML IJ SOLN
INTRAMUSCULAR | Status: AC
  Filled 2020-10-11: qty 1

## 2020-10-11 MED ORDER — ACETAMINOPHEN 10 MG/ML IV SOLN
INTRAVENOUS | Status: DC | PRN
Start: 2020-10-11 — End: 2020-10-11
  Administered 2020-10-11: 1000 mg via INTRAVENOUS

## 2020-10-11 MED ORDER — NALOXONE HCL 4 MG/10ML IJ SOLN
1.0000 ug/kg/h | INTRAVENOUS | Status: DC | PRN
  Filled 2020-10-11: qty 5

## 2020-10-11 MED ORDER — PHENYLEPHRINE HCL-NACL 20-0.9 MG/250ML-% IV SOLN
INTRAVENOUS | Status: DC | PRN
  Administered 2020-10-11: 60 ug/min via INTRAVENOUS

## 2020-10-11 MED ORDER — KETOROLAC TROMETHAMINE 30 MG/ML IJ SOLN: 30.0000 mg | Freq: Four times a day (QID) | INTRAMUSCULAR | Status: AC | PRN

## 2020-10-11 MED ORDER — FENTANYL CITRATE (PF) 100 MCG/2ML IJ SOLN
INTRAMUSCULAR | Status: DC | PRN
  Administered 2020-10-11: 15 ug via INTRATHECAL

## 2020-10-11 MED ORDER — FERROUS SULFATE 325 (65 FE) MG PO TABS
325.0000 mg | ORAL_TABLET | Freq: Two times a day (BID) | ORAL | Status: DC
  Administered 2020-10-12 – 2020-10-13 (×3): 325 mg via ORAL
  Filled 2020-10-11 (×4): qty 1

## 2020-10-11 MED ORDER — LIDOCAINE HCL (PF) 2 % IJ SOLN
INTRAMUSCULAR | Status: AC
  Filled 2020-10-11: qty 10

## 2020-10-11 MED ORDER — ACETAMINOPHEN 500 MG PO TABS
1000.0000 mg | ORAL_TABLET | Freq: Four times a day (QID) | ORAL | Status: DC
  Administered 2020-10-11: 1000 mg via ORAL
  Filled 2020-10-11: qty 2

## 2020-10-11 MED ORDER — DIBUCAINE (PERIANAL) 1 % EX OINT: 1.0000 "application " | TOPICAL_OINTMENT | CUTANEOUS | Status: DC | PRN

## 2020-10-11 MED ORDER — PHENYLEPHRINE HCL (PRESSORS) 10 MG/ML IV SOLN
INTRAVENOUS | Status: DC | PRN
  Administered 2020-10-11: 40 ug via INTRAVENOUS
  Administered 2020-10-11 (×2): 80 ug via INTRAVENOUS

## 2020-10-11 MED ORDER — NALBUPHINE HCL 10 MG/ML IJ SOLN
5.0000 mg | INTRAMUSCULAR | Status: DC | PRN
Start: 2020-10-11 — End: 2020-10-13

## 2020-10-11 MED ORDER — COCONUT OIL OIL: 1.0000 "application " | TOPICAL_OIL | Status: DC | PRN

## 2020-10-11 SURGICAL SUPPLY — 49 items
APL SKNCLS STERI-STRIP NONHPOA (GAUZE/BANDAGES/DRESSINGS) ×1
BARRIER ADHS 3X4 INTERCEED (GAUZE/BANDAGES/DRESSINGS) ×1 IMPLANT
BENZOIN TINCTURE PRP APPL 2/3 (GAUZE/BANDAGES/DRESSINGS) ×2 IMPLANT
BLADE SURG 15 STRL LF C SS BP (BLADE) IMPLANT
BLADE SURG 15 STRL SS (BLADE) ×2
BRR ADH 4X3 ABS CNTRL BYND (GAUZE/BANDAGES/DRESSINGS) ×1
CHLORAPREP W/TINT 26ML (MISCELLANEOUS) ×2 IMPLANT
CLAMP CORD UMBIL (MISCELLANEOUS) IMPLANT
CLOTH BEACON ORANGE TIMEOUT ST (SAFETY) ×2 IMPLANT
DRAIN JACKSON PRT FLT 10 (DRAIN) ×1 IMPLANT
DRSG OPSITE POSTOP 4X10 (GAUZE/BANDAGES/DRESSINGS) ×2 IMPLANT
DRSG PAD ABDOMINAL 8X10 ST (GAUZE/BANDAGES/DRESSINGS) ×1 IMPLANT
ELECT REM PT RETURN 9FT ADLT (ELECTROSURGICAL) ×2
ELECTRODE REM PT RTRN 9FT ADLT (ELECTROSURGICAL) ×1 IMPLANT
EVACUATOR SILICONE 100CC (DRAIN) ×1 IMPLANT
EXTRACTOR VACUUM KIWI (MISCELLANEOUS) IMPLANT
GAUZE SPONGE 4X4 12PLY STRL LF (GAUZE/BANDAGES/DRESSINGS) ×3 IMPLANT
GLOVE BIO SURGEON STRL SZ 6.5 (GLOVE) ×2 IMPLANT
GLOVE BIOGEL PI IND STRL 7.0 (GLOVE) ×2 IMPLANT
GLOVE BIOGEL PI INDICATOR 7.0 (GLOVE) ×2
GOWN STRL REUS W/TWL LRG LVL3 (GOWN DISPOSABLE) ×6 IMPLANT
HEMOSTAT ARISTA ABSORB 3G PWDR (HEMOSTASIS) ×1 IMPLANT
KIT ABG SYR 3ML LUER SLIP (SYRINGE) IMPLANT
NDL HYPO 25X5/8 SAFETYGLIDE (NEEDLE) IMPLANT
NEEDLE HYPO 22GX1.5 SAFETY (NEEDLE) IMPLANT
NEEDLE HYPO 25X5/8 SAFETYGLIDE (NEEDLE) IMPLANT
NS IRRIG 1000ML POUR BTL (IV SOLUTION) ×2 IMPLANT
PACK C SECTION WH (CUSTOM PROCEDURE TRAY) ×2 IMPLANT
PAD ABD 7.5X8 STRL (GAUZE/BANDAGES/DRESSINGS) ×1 IMPLANT
PAD ABD DERMACEA PRESS 5X9 (GAUZE/BANDAGES/DRESSINGS) ×1 IMPLANT
PAD OB MATERNITY 4.3X12.25 (PERSONAL CARE ITEMS) ×2 IMPLANT
PENCIL SMOKE EVAC W/HOLSTER (ELECTROSURGICAL) ×2 IMPLANT
RTRCTR C-SECT PINK 25CM LRG (MISCELLANEOUS) ×2 IMPLANT
STRIP CLOSURE SKIN 1/2X4 (GAUZE/BANDAGES/DRESSINGS) ×2 IMPLANT
STRIP SURGICAL 1/2 X 6 IN (GAUZE/BANDAGES/DRESSINGS) ×1 IMPLANT
SUT CHROMIC 2 0 CT 1 (SUTURE) ×6 IMPLANT
SUT MNCRL 0 VIOLET CTX 36 (SUTURE) ×2 IMPLANT
SUT MONOCRYL 0 CTX 36 (SUTURE) ×4
SUT PDS AB 0 CTX 60 (SUTURE) ×2 IMPLANT
SUT PLAIN 2 0 (SUTURE)
SUT PLAIN ABS 2-0 CT1 27XMFL (SUTURE) IMPLANT
SUT SILK 2 0 FSL 18 (SUTURE) ×1 IMPLANT
SUT VIC AB 0 CTX 36 (SUTURE) ×4
SUT VIC AB 0 CTX36XBRD ANBCTRL (SUTURE) ×2 IMPLANT
SUT VIC AB 4-0 KS 27 (SUTURE) ×2 IMPLANT
SYR CONTROL 10ML LL (SYRINGE) IMPLANT
TOWEL OR 17X24 6PK STRL BLUE (TOWEL DISPOSABLE) ×2 IMPLANT
TRAY FOLEY W/BAG SLVR 14FR LF (SET/KITS/TRAYS/PACK) ×2 IMPLANT
WATER STERILE IRR 1000ML POUR (IV SOLUTION) ×2 IMPLANT

## 2020-10-11 NOTE — Transfer of Care (Signed)
Immediate Anesthesia Transfer of Care Note  Patient: Gwendolyn Lynch  Procedure(s) Performed: CESAREAN SECTION (N/A ) APPLICATION OF CELL SAVER (N/A )  Patient Location: PACU  Anesthesia Type:Spinal and Epidural  Level of Consciousness: awake, alert  and oriented  Airway & Oxygen Therapy: Patient Spontanous Breathing  Post-op Assessment: Report given to RN and Post -op Vital signs reviewed and stable  Post vital signs: Reviewed and stable  Last Vitals:  Vitals Value Taken Time  BP 124/87 10/11/20 1300  Temp    Pulse 82 10/11/20 1304  Resp 20 10/11/20 1304  SpO2 97 % 10/11/20 1304  Vitals shown include unvalidated device data.  Last Pain:  Vitals:   10/11/20 0807  TempSrc: Oral         Complications: No complications documented.

## 2020-10-11 NOTE — Anesthesia Postprocedure Evaluation (Signed)
Anesthesia Post Note  Patient: Gwendolyn Lynch  Procedure(s) Performed: CESAREAN SECTION (N/A ) APPLICATION OF CELL SAVER (N/A )     Patient location during evaluation: PACU Anesthesia Type: Combined Spinal/Epidural Level of consciousness: oriented and awake and alert Pain management: pain level controlled Vital Signs Assessment: post-procedure vital signs reviewed and stable Respiratory status: spontaneous breathing, respiratory function stable and nonlabored ventilation Cardiovascular status: blood pressure returned to baseline and stable Postop Assessment: no headache, no backache, no apparent nausea or vomiting and spinal receding Anesthetic complications: no   No complications documented.  Last Vitals:  Vitals:   10/11/20 1530 10/11/20 1603  BP: 118/81   Pulse: 83   Resp: 20   Temp: 37.3 C   SpO2:  97%    Last Pain:  Vitals:   10/11/20 1603  TempSrc:   PainSc: 0-No pain   Pain Goal:                   Lucretia Kern

## 2020-10-11 NOTE — H&P (Signed)
Gwendolyn Lynch is a 35 y.o. female  [id number]. 1 SVD, followed by Previous CS x 4 Female # 6 CHTN, not taking any medications Alpha thal. Carrier Current Hgb 8.5 COVID pos 12/27. Only symptoms were loss of taste and smell Denies contractions, vb, or lof. Reports adequate fetal movement.  OB History    Gravida  6   Para  5   Term  5   Preterm  0   AB  0   Living  5     SAB  0   IAB  0   Ectopic  0   Multiple  0   Live Births  5          Past Medical History:  Diagnosis Date  . Anemia   . Hemorrhoids   . Hyperemesis gravidarum   . Hypertension affecting pregnancy    Has had hypertension during previous pregnancies  . Tachycardia   . Thalassemia carrier    Past Surgical History:  Procedure Laterality Date  . CESAREAN SECTION     C/S x 3  . CESAREAN SECTION N/A 11/01/2015   Procedure: CESAREAN SECTION;  Surgeon: Jaymes Graff, MD;  Location: [facility];  Service: Obstetrics;  Laterality: N/A;  . CESAREAN SECTION N/A 04/22/2017   Procedure: REPEAT CESAREAN SECTION;  Surgeon: Federico Flake, MD;  Location: [facility] BIRTHING SUITES;  Service: Obstetrics;  Laterality: N/A;  . EVENT MONITOR  05/2020   Rhythm was normal sinus.  Average heart rate 92 bpm, max heart rate 166 bpm with minimum heart rate of 68 bpm.  Mostly sinus rhythm.  PACs and PVCs.  No arrhythmia noted.  . TRANSTHORACIC ECHOCARDIOGRAM  05/22/2020   EF 55 to 60%.  Normal diastolic parameters.  Normal right ventricle.  Normal valves.   Family History: family history includes Diabetes in her father and mother; Hypertension in her father and mother. Social History:  reports that she has never smoked. She has never used smokeless tobacco. She reports that she does not drink alcohol and does not use drugs.     Maternal Diabetes: No Genetic Screening: Normal Maternal Ultrasounds/Referrals: Normal Fetal Ultrasounds or other Referrals:  Referred to Materal Fetal Medicine  Maternal Substance Abuse:   No Significant Maternal Medications:  None Significant Maternal Lab Results:  Group B Strep negative Other Comments:  None  Review of Systems  Constitutional: Negative.   HENT: Negative.   Eyes: Negative.   Respiratory: Negative.   Cardiovascular: Negative.   Gastrointestinal: Negative.   Endocrine: Negative.   Genitourinary: Negative.   Musculoskeletal: Negative.   Skin: Negative.   Allergic/Immunologic: Negative.   Neurological: Negative.   Hematological: Negative.   Psychiatric/Behavioral: Negative.    History   Blood pressure 135/88, pulse 95, temperature 98.3 F (36.8 C), temperature source Oral, resp. rate 18, height 4\' 10"  (1.473 m), weight 79.8 kg, last menstrual period 12/24/2019, SpO2 99 %, currently breastfeeding. Exam Physical Exam Constitutional:      Appearance: Normal appearance.  HENT:     Head: Normocephalic and atraumatic.     Nose: Nose normal.     Mouth/Throat:     Mouth: Mucous membranes are moist.  Eyes:     Extraocular Movements: Extraocular movements intact.     Conjunctiva/sclera: Conjunctivae normal.  Cardiovascular:     Rate and Rhythm: Normal rate and regular rhythm.     Pulses: Normal pulses.  Pulmonary:     Effort: Pulmonary effort is normal.     Breath sounds: Normal breath  sounds.  Abdominal:     Palpations: Abdomen is soft.  Musculoskeletal:        General: Normal range of motion.     Cervical back: Normal range of motion and neck supple.  Skin:    General: Skin is warm and dry.  Neurological:     General: No focal deficit present.     Mental Status: She is alert and oriented to person, place, and time.  Psychiatric:        Mood and Affect: Mood normal.        Behavior: Behavior normal.        Thought Content: Thought content normal.        Judgment: Judgment normal.     Prenatal labs: ABO, Rh: --/--/O POS (01/18 1008) Antibody: NEG (01/18 1008) Rubella: 3.40 (07/01 1021) RPR: NON REACTIVE (01/18 1030)  HBsAg: Negative  (07/01 1021)  HIV: Non Reactive (07/01 1021)  GBS: NEGATIVE/-- (12/27 2151)   Assessment/Plan: Admit to Pre-op Routine orders Plan for cell saver during CS 2 MD CS due to complexity MD for discussion and consent   Victorino Dike B Crumpler 10/11/2020, 8:09 AM

## 2020-10-11 NOTE — Anesthesia Procedure Notes (Signed)
Epidural Patient location during procedure: OB  Staffing Anesthesiologist: Lucretia Kern, MD Performed: anesthesiologist   Preanesthetic Checklist Completed: patient identified, IV checked, risks and benefits discussed, surgical consent, monitors and equipment checked, pre-op evaluation and timeout performed  Epidural Patient position: sitting Prep: DuraPrep Patient monitoring: heart rate, continuous pulse ox and blood pressure Approach: midline Location: L3-L4 Injection technique: LOR air  Needle:  Needle type: Tuohy (+ 12.7 cm 25g Pencan spinal needle)  Needle gauge: 17 G Needle length: 9 cm Needle insertion depth: 7 cm Catheter type: closed end flexible Catheter size: 19 Gauge Catheter at skin depth: 12 cm  Assessment Sensory level: T6 Events: blood not aspirated, injection not painful, no injection resistance, no paresthesia and negative IV test  Additional Notes The patient was prepped and draped in the usual sterile fashion. A combined spinal-epidural was performed using a 9 cm 17g Tuohy needle and loss of resistance technique. After encountering LOR, a 12.7 cm 25g Pencan spinal needle was introduced via the Tuohy and clear CSF was aspirated prior to injection of local anesthetic. The spinal needle was removed and a 19 g flexible epidural catheter placed prior to Tuohy removal. Patient tolerated the procedure well without complications.Reason for block:surgical anesthesia

## 2020-10-11 NOTE — Op Note (Signed)
Cesarean Section Procedure Note  Indications: previous uterine incision kerr x 5  Pre-operative Diagnosis: 38 week  0 day pregnancy, prior Low transverse cesarean             Sections x 4; Benign essential hypertension complicating pregnancy                                              Anemia, Small for gestational age fetus  Post-operative Diagnosis: same plus uterine window, very thin lower uterine segment                                                Severe adhesive disease  Surgeon: Wynonia Hazard MD  Assistants: 1. Sallye Ober EMA, MD  2. CRUMPLER, JESSICA, CNM  Anesthesia: Spinal anesthesia  ASA Class: 2  Indication:   35 year old G6P5005 at 55 weeks with chronic hypertension, anemia, previous four cesarean sections and small for gestational age fetus followed by Maternal Fetal Medicine for elective repeat cesarean section. Patient counseled on risks of procedure to include:  hemorrhage, necessity for blood transfusion, infection, cesarean hysterectomy, injury to other organs and vessels, injury to the fetus, or maternal death. Patient voiced understanding of these risks and agrees to proceed. There were no inquiries made by the patient.   Procedure Details   The patient was counseled about the risks, benefits, complications of the cesarean section. The patient concurred with the proposed plan, giving informed consent.  The site of surgery properly noted/marked. The patient was taken to Operating Room # A, identified as Gwendolyn Lynch and the procedure verified as C-Section Delivery. A Time Out was held and the above information confirmed.  After spinal was found to adequate, anesthesia gave preoperative TXA. The patient was placed in the dorsal supine position with a leftward tilt, draped and prepped in the usual sterile manner. A Pfannenstiel incision was made and carried down through the subcutaneous tissue to the fascia with a 10 blade scalpel along her prior scar. The fascia was  incised in the midline and extended laterally with tenting up with Kelly clamps and incised using Bovie cautery. The fascia was noted to be adhered to the underlying muscle and there was an abdominal peritoneum opening upon entry.  The superior aspect of the fascial incision was grasped with Coker clamps x2, tented up, and the peritoneal opening was used to ensure the underlying bowel and omentum was safe while the fascia was dissected off the underlying rectus abdominus muscle. This was done sharply both with a scalpel and with Mayo scissors.   The fascial incision was held inferiorly with Kocher clamps and the muscle bluntly pushed off the fascia. The abdominal peritoneum was already entered previously so it was extended by gentle pulling by the surgeons. The Balfour bladder blade was placed protecting the bladder and the bladder was noted to be adherent to the lower uterine segment. These adhesions were sharply taken down with Metzenbaum scissors.  There was a uterine window noted on the right side of the uterus.  A 10 blade scalpel was then used to make a low transverse incision on the uterus which was extended laterally with  blunt dissection. The fetal vertex was identified, the head was brought  out from the pelvis with assistance by fundal pressure.  The head was then delivered easily through the uterine incision followed by the body. The A live female infant was bulb suctioned on the operative field cried vigorously, cord was clamped and cut and the infant was passed to the waiting neonatologist. Apgars 9/9. Cord blood obtained. Placenta was then delivered spontaneously, intact and appear normal, the uterus was cleared of all clot and debris. The uterus was exteriorized and the uterine incision was repaired with #0 Monocryl in running locked fashion. A second imbricating suture was performed using the same suture. There was bleeding from the left side of the incision due to thinness of the lower uterine  segment. Several running locking sutures of 2-0 chromic were used to obtain hemostasis. The incision was then hemostatic. The bladder was backfilled with sterile milk to ensure that the bladder was water tight.    Interceed and Arista was placed over the incisions.Ovaries and tubes were inspected and normal. The  Uterus was placed back in to the abdomen.  Balfour and retractors were removed. The abdominal cavity was cleared of all clot and debris. There was no abdominal peritoneum. So an En Bloc re-approximation of rectus muscle with peritoneum was done with 2-0 chromic  in a running fashion.    The fascia was closed with 0 PDS in a running fashion.  A Al Pimple drain was placed above the fascia. The subcuticular layer was irrigated and all bleeders cauterized.    The incision was injected with 25 mL of 0.5% Marcaine.  The skin was closed with 4-0 vicryl in a subcuticular fashion using a Mellody Dance needle. The incision was dressed with benzoine, steri strips, honeycomb and pressure dressing. All sponge lap and needle counts were correct x3. Patient tolerated the procedure well and recovered in stable condition following the procedure.  Instrument, sponge, and needle counts were correct prior the abdominal closure and at the conclusion of the case.   Findings: Live female infant, Apgars 9/9, clear amniotic fluid, normal appearing placenta, Very thin lower uterine segment with window along the right side. Severe adhesive disease between bladder and lower uterine segment. Normal bilateral tubes and ovaries. Water tight bladder  Estimated Blood Loss: 274 mL  IVF: 1100 mL         Drains: Foley catheter  Urine output: 100 mL clear urine         Specimens: Placenta to Pathology         Implants: Jackson-Pratt Drain          Complications:  None; patient tolerated the procedure well.         Disposition: PACU - hemodynamically stable.   Attending Attestation: I PERFORMED THE PROCEDURE AND MY  ASSISTANTS WERE NEEDED FOR THE COMPLEXITY OF THE CASE    Essie Hart

## 2020-10-11 NOTE — Anesthesia Preprocedure Evaluation (Addendum)
Anesthesia Evaluation  Patient identified by MRN, date of birth, ID band Patient awake    Reviewed: Allergy & Precautions, NPO status , Patient's Chart, lab work & pertinent test results  History of Anesthesia Complications Negative for: history of anesthetic complications  Airway Mallampati: II  TM Distance: >3 FB Neck ROM: Full    Dental   Pulmonary Recent URI  (COVID+ 09/17/20, mild symptoms),    Pulmonary exam normal        Cardiovascular hypertension, Normal cardiovascular exam     Neuro/Psych negative neurological ROS  negative psych ROS   GI/Hepatic negative GI ROS, Neg liver ROS,   Endo/Other  negative endocrine ROS  Renal/GU negative Renal ROS  negative genitourinary   Musculoskeletal negative musculoskeletal ROS (+)   Abdominal   Peds  Hematology  (+) anemia , Hgb 8.5, plts 224   Anesthesia Other Findings  4 prior C/S  Echo 05/22/20: EF 55-60%, normal RV function, valves unremarkable  Reproductive/Obstetrics (+) Pregnancy                            Anesthesia Physical Anesthesia Plan  ASA: III  Anesthesia Plan: Combined Spinal and Epidural   Post-op Pain Management:    Induction:   PONV Risk Score and Plan: 3 and Ondansetron and Treatment may vary due to age or medical condition  Airway Management Planned: Natural Airway  Additional Equipment: None  Intra-op Plan:   Post-operative Plan:   Informed Consent: I have reviewed the patients History and Physical, chart, labs and discussed the procedure including the risks, benefits and alternatives for the proposed anesthesia with the patient or authorized representative who has indicated his/her understanding and acceptance.       Plan Discussed with:   Anesthesia Plan Comments: ( Plan for CSE, large PIV x2)      Anesthesia Quick Evaluation

## 2020-10-11 NOTE — Progress Notes (Signed)
MD PREOPERATIVE NOTE   Preoperative Diagnosis: 1. Single intrauterine pregnancy at 38 weeks                                         2. Benign essential hypertension complicating pregnancy                                         3. Prior four low transverse cesarean sections                               Procedure:   Elective Repeat Cesarean Section   Indication:      35 year old G6P5005 at 64 weeks with chronic hypertension, anemia, previous four cesarean sections and small for gestational age fetus followed by Maternal Fetal Medicine for elective repeat cesarean section. Patient counseled on risks of procedure to include:  hemorrhage, necessity for blood transfusion, infection, cesarean hysterectomy, injury to other organs and vessels, injury to the fetus, or maternal death.  Patient voiced understanding of these risks and agrees to proceed. There were no inquiries made by the patient.   Consent signed and witnessed and will be scanned into chart.   Pre-operative Hb 8.2  Patient to typed and cross matched for two units of packed red blood cells.   Naoma Diener Osborn Pullin

## 2020-10-11 NOTE — Op Note (Signed)
Cesarean Section Procedure Note  Indications: previous uterine incision kerr x 5  Pre-operative Diagnosis: 38 week  0 day pregnancy, prior Low transverse cesarean             Sections x 4; Benign essential hypertension complicating pregnancy                                              Anemia, Small for gestational age fetus  Post-operative Diagnosis: same plus uterine window, very thin lower uterine segment                                                Severe adhesive disease  Surgeon: Gwendolyn Hazard MD  Assistants: 1. Gwendolyn Lynch EMA, MD  2. Gwendolyn Lynch, CNM  Anesthesia: Spinal anesthesia  ASA Class: 2  Indication:   35 year old G6P5005 at 55 weeks with chronic hypertension, anemia, previous four cesarean sections and small for gestational age fetus followed by Maternal Fetal Medicine for elective repeat cesarean section. Patient counseled on risks of procedure to include:  hemorrhage, necessity for blood transfusion, infection, cesarean hysterectomy, injury to other organs and vessels, injury to the fetus, or maternal death. Patient voiced understanding of these risks and agrees to proceed. There were no inquiries made by the patient.   Procedure Details   The patient was counseled about the risks, benefits, complications of the cesarean section. The patient concurred with the proposed plan, giving informed consent.  The site of surgery properly noted/marked. The patient was taken to Operating Room # A, identified as Gwendolyn Lynch and the procedure verified as C-Section Delivery. A Time Out was held and the above information confirmed.  After spinal was found to adequate, anesthesia gave preoperative TXA. The patient was placed in the dorsal supine position with a leftward tilt, draped and prepped in the usual sterile manner. A Pfannenstiel incision was made and carried down through the subcutaneous tissue to the fascia with a 10 blade scalpel along her prior scar. The fascia was  incised in the midline and extended laterally with tenting up with Kelly clamps and incised using Bovie cautery. The fascia was noted to be adhered to the underlying muscle and there was an abdominal peritoneum opening upon entry.  The superior aspect of the fascial incision was grasped with Coker clamps x2, tented up, and the peritoneal opening was used to ensure the underlying bowel and omentum was safe while the fascia was dissected off the underlying rectus abdominus muscle. This was done sharply both with a scalpel and with Mayo scissors.   The fascial incision was held inferiorly with Kocher clamps and the muscle bluntly pushed off the fascia. The abdominal peritoneum was already entered previously so it was extended by gentle pulling by the surgeons. The Balfour bladder blade was placed protecting the bladder and the bladder was noted to be adherent to the lower uterine segment. These adhesions were sharply taken down with Metzenbaum scissors.  There was a uterine window noted on the right side of the uterus.  A 10 blade scalpel was then used to make a low transverse incision on the uterus which was extended laterally with  blunt dissection. The fetal vertex was identified, the head was brought  out from the pelvis with assistance by fundal pressure.  The head was then delivered easily through the uterine incision followed by the body. The A live female infant was bulb suctioned on the operative field cried vigorously, cord was clamped and cut and the infant was passed to the waiting neonatologist. Apgars 9/9. Cord blood obtained. Placenta was then delivered spontaneously, intact and appear normal, the uterus was cleared of all clot and debris. The uterus was exteriorized and the uterine incision was repaired with #0 Monocryl in running locked fashion. A second imbricating suture was performed using the same suture. There was bleeding from the left side of the incision due to thinness of the lower uterine  segment. Several running locking sutures of 2-0 chromic were used to obtain hemostasis. The incision was then hemostatic. The bladder was backfilled with sterile milk to ensure that the bladder was water tight.    Interceed and Arista was placed over the incisions.Ovaries and tubes were inspected and normal. The  Uterus was placed back in to the abdomen.  Balfour and retractors were removed. The abdominal cavity was cleared of all clot and debris. There was no abdominal peritoneum. So an En Bloc re-approximation of rectus muscle with peritoneum was done with 2-0 chromic  in a running fashion.    The fascia was closed with 0 PDS in a running fashion.  A Jackson Pratt drain was placed above the fascia. The subcuticular layer was irrigated and all bleeders cauterized.    The incision was injected with 25 mL of 0.5% Marcaine.  The skin was closed with 4-0 vicryl in a subcuticular fashion using a Keith needle. The incision was dressed with benzoine, steri strips, honeycomb and pressure dressing. All sponge lap and needle counts were correct x3. Patient tolerated the procedure well and recovered in stable condition following the procedure.  Instrument, sponge, and needle counts were correct prior the abdominal closure and at the conclusion of the case.   Findings: Live female infant, Apgars 9/9, clear amniotic fluid, normal appearing placenta, Very thin lower uterine segment with window along the right side. Severe adhesive disease between bladder and lower uterine segment. Normal bilateral tubes and ovaries. Water tight bladder  Estimated Blood Loss: 274 mL  IVF: 1100 mL         Drains: Foley catheter  Urine output: 100 mL clear urine         Specimens: Placenta to Pathology         Implants: Jackson-Pratt Drain          Complications:  None; patient tolerated the procedure well.         Disposition: PACU - hemodynamically stable.   Attending Attestation: I PERFORMED THE PROCEDURE AND MY  ASSISTANTS WERE NEEDED FOR THE COMPLEXITY OF THE CASE    Gwendolyn Lynch  

## 2020-10-12 LAB — CBC
HCT: 23.8 % — ABNORMAL LOW (ref 36.0–46.0)
Hemoglobin: 7.2 g/dL — ABNORMAL LOW (ref 12.0–15.0)
MCH: 22.9 pg — ABNORMAL LOW (ref 26.0–34.0)
MCHC: 30.3 g/dL (ref 30.0–36.0)
MCV: 75.8 fL — ABNORMAL LOW (ref 80.0–100.0)
Platelets: 225 10*3/uL (ref 150–400)
RBC: 3.14 MIL/uL — ABNORMAL LOW (ref 3.87–5.11)
RDW: 16.5 % — ABNORMAL HIGH (ref 11.5–15.5)
WBC: 13.8 10*3/uL — ABNORMAL HIGH (ref 4.0–10.5)
nRBC: 0.2 % (ref 0.0–0.2)

## 2020-10-12 MED ORDER — SODIUM CHLORIDE 0.9 % IV SOLN
510.0000 mg | Freq: Once | INTRAVENOUS | Status: AC
  Administered 2020-10-12: 510 mg via INTRAVENOUS
  Filled 2020-10-12 (×2): qty 17

## 2020-10-12 NOTE — Progress Notes (Signed)
Patient dressing changed at 0445. Patient had small amount of serosanguinous leakage from side of pressure dressing. Called CNM on call to get orders to change dressing. Applied tegaderm dressing on patients right side where honeycomb was lifting and new pressure dressing over the clean honeycomb.

## 2020-10-12 NOTE — Progress Notes (Signed)
Patient foley removed at 0330. Educated patient on 6 hour time frame to urinate, but advised patient to go in at least two hours.   Patient did not have a foley icon on LDA avatar.

## 2020-10-12 NOTE — Progress Notes (Signed)
Subjective: POD# 1 - Repeat Cesarean Delivery Information for the patient's newborn:  Gwendolyn Lynch, Gwendolyn Lynch [[id number]]  female   Circumcision - 10/12/20 complete  Reports feeling "pretty good" Denies dizziness, fatigue, tiredness  Feeding: breast Reports tolerating PO and denies N/V, foley removed, ambulating and urinating w/o difficulty  Pain controlled with acetaminophen Denies HA/SOB/dizziness  Flatus - positive Vaginal bleeding is normal, denies clots     Objective:  VS:  Vitals:   10/11/20 2300 10/12/20 0242 10/12/20 0255 10/12/20 0605  BP: 121/62  125/82 124/66  Pulse: 71 73 73 71  Resp: 16 17 17 18   Temp: 98.7 F (37.1 C)  98.4 F (36.9 C) 98.1 F (36.7 C)  TempSrc: Oral  Oral Oral  SpO2: 99%  98% 99%  Weight:      Height:        Intake/Output Summary (Last 24 hours) at 10/12/2020 1142 Last data filed at 10/12/2020 0600 Gross per 24 hour  Intake 1284 ml  Output 2415 ml  Net -1131 ml     Recent Labs    10/12/20 0600  WBC 13.8*  HGB 7.2*  HCT 23.8*  PLT 225    Blood type: --/--/O POS (01/18 1008) Rubella: 3.40 (07/01 1021)    Physical Exam:  General: alert, cooperative and no distress CV: Regular rate and rhythm Resp: clear Abdomen: soft, nontender, normal bowel sounds Incision: clean, dry and intact Uterine Fundus: firm, below umbilicus, nontender Lochia: minimal Ext: extremities normal, atraumatic, no cyanosis or edema   Assessment/Plan: 35 y.o.   POD# 1. 32 Repeat cesarean section x 6. JP drain in place approx [id number] in drain. Pt ambulating, voiding, passing gas. Hgb 7.2 (10/12/20) asymptomatic.                  Active Problems:   Delivery by elective cesarean section   Encounter for maternal care for scar from repeat cesarean delivery   Routine post-op PP care          Advance diet as tolerated Advised warm fluids and ambulation to improve GI motility IV Iron today - hgb 7.2 Encourage rest when baby rests Breastfeeding  support Anticipate D/C 10/14/20  Dr 10/16/20 updated on pt status and POC reviewed.  Normand Sloop, MSN, CNM 10/12/2020, 11:42 AM

## 2020-10-13 ENCOUNTER — Encounter (HOSPITAL_COMMUNITY): Payer: Self-pay | Admitting: Obstetrics & Gynecology

## 2020-10-13 DIAGNOSIS — D509 Iron deficiency anemia, unspecified: Secondary | ICD-10-CM | POA: Diagnosis present

## 2020-10-13 HISTORY — PX: NEXPLANON TRAY: NUR84248

## 2020-10-13 LAB — TYPE AND SCREEN
ABO/RH(D): O POS
Antibody Screen: NEGATIVE
Unit division: 0

## 2020-10-13 LAB — BPAM RBC
Blood Product Expiration Date: 202202212359
Unit Type and Rh: 5100

## 2020-10-13 MED ORDER — ACETAMINOPHEN 160 MG/5ML PO SOLN: 1000.0000 mg | Freq: Four times a day (QID) | ORAL | 0 refills | Status: AC

## 2020-10-13 MED ORDER — LIDOCAINE HCL 1 % IJ SOLN
0.0000 mL | Freq: Once | INTRAMUSCULAR | Status: DC | PRN
  Filled 2020-10-13: qty 20

## 2020-10-13 MED ORDER — OXYCODONE HCL 5 MG PO TABS
5.0000 mg | ORAL_TABLET | Freq: Four times a day (QID) | ORAL | 0 refills | Status: AC | PRN
Start: 2020-10-13 — End: 2020-10-18

## 2020-10-13 MED ORDER — POLYSACCHARIDE IRON COMPLEX 150 MG PO CAPS: 150.0000 mg | ORAL_CAPSULE | Freq: Every day | ORAL | 2 refills | Status: DC

## 2020-10-13 MED ORDER — POLYSACCHARIDE IRON COMPLEX 150 MG PO CAPS: 150.0000 mg | ORAL_CAPSULE | Freq: Every day | ORAL | Status: DC

## 2020-10-13 MED ORDER — ETONOGESTREL 68 MG ~~LOC~~ IMPL
68.0000 mg | DRUG_IMPLANT | Freq: Once | SUBCUTANEOUS | Status: AC
  Administered 2020-10-13: 68 mg via SUBCUTANEOUS
  Filled 2020-10-13: qty 1

## 2020-10-13 NOTE — Discharge Summary (Signed)
Postpartum Discharge Summary  Date of Service updated 10/13/20    Patient Name: Gwendolyn Lynch DOB: 1986/06/03 MRN: [id number]  Date of admission: 10/11/2020 Delivery date:10/11/2020  Delivering provider: Sanjuana Kava  Date of discharge: 10/13/2020  Admitting diagnosis: Delivery by elective cesarean section [O82] Encounter for maternal care for scar from repeat cesarean delivery [O34.219] Intrauterine pregnancy: [redacted]w[redacted]d     Secondary diagnosis:  Active Problems:   Delivery by elective cesarean section   Encounter for maternal care for scar from repeat cesarean delivery   IDA (iron deficiency anemia)  Additional problems: none    Discharge diagnosis: Term Pregnancy Delivered and Iron deficiency anemia, asymptomtic                                              Post partum procedures:Iron infusion Augmentation: none Complications: None  Hospital course: Scheduled C/S   35 y.o. yo [id number] at [redacted]w[redacted]d was admitted to the hospital 10/11/2020 for scheduled cesarean section with the following indication:Elective Repeat.Delivery details are as follows:  Membrane Rupture Time/Date: 11:40 AM ,10/11/2020   Delivery Method:C-Section, Low Transverse  Details of operation can be found in separate operative note.  Patient had an uncomplicated postpartum course.  She is ambulating, tolerating a regular diet, passing flatus, and urinating well. Patient is discharged home in stable condition on  10/13/20        Newborn Data: Birth date:10/11/2020  Birth time:11:41 AM  Gender:Female  Living status:Living  Apgars:9 ,9  Weight:3090 g     Magnesium Sulfate received: No BMZ received: No Rhophylac:N/A MMR:N/A Transfusion:No  Physical exam  Vitals:   10/12/20 0255 10/12/20 0605 10/12/20 1940 10/13/20 0620  BP: 125/82 124/66 130/89 138/88  Pulse: 73 71 82 78  Resp: $Remo'17 18 18 18  'nVgtr$ Temp: 98.4 F (36.9 C) 98.1 F (36.7 C) 98.3 F (36.8 C) 98.2 F (36.8 C)  TempSrc: Oral Oral Oral Oral  SpO2: 98% 99% 98%  98%  Weight:      Height:       General: alert, cooperative and no distress Lochia: appropriate Uterine Fundus: firm Incision: Healing well with no significant drainage, Dressing is clean, dry, and intact, JP drain removed, 7 ml serosanguineous drainage in reservoir DVT Evaluation: No evidence of DVT seen on physical exam. No cords or calf tenderness. No significant calf/ankle edema. Labs: Lab Results  Component Value Date   WBC 13.8 (H) 10/12/2020   HGB 7.2 (L) 10/12/2020   HCT 23.8 (L) 10/12/2020   MCV 75.8 (L) 10/12/2020   PLT 225 10/12/2020   CMP Latest Ref Rng & Units 10/09/2020  Glucose 70 - 99 mg/dL 80  BUN 6 - 20 mg/dL <5(L)  Creatinine 0.44 - 1.00 mg/dL 0.61  Sodium 135 - 145 mmol/L 137  Potassium 3.5 - 5.1 mmol/L 3.7  Chloride 98 - 111 mmol/L 104  CO2 22 - 32 mmol/L 23  Calcium 8.9 - 10.3 mg/dL 8.8(L)  Total Protein 6.5 - 8.1 g/dL -  Total Bilirubin 0.3 - 1.2 mg/dL -  Alkaline Phos 38 - 126 U/L -  AST 15 - 41 U/L -  ALT 0 - 44 U/L -   Edinburgh Score: Edinburgh Postnatal Depression Scale Screening Tool 10/12/2020  I have been able to laugh and see the funny side of things. 0  I have looked forward with enjoyment to things. 0  I have blamed  myself unnecessarily when things went wrong. 1  I have been anxious or worried for no good reason. 0  I have felt scared or panicky for no good reason. 0  Things have been getting on top of me. 1  I have been so unhappy that I have had difficulty sleeping. 0  I have felt sad or miserable. 0  I have been so unhappy that I have been crying. 0  The thought of harming myself has occurred to me. 0  Edinburgh Postnatal Depression Scale Total 2      After visit meds:  Allergies as of 10/13/2020   No Known Allergies     Medication List    STOP taking these medications   aspirin EC 81 MG tablet   Blood Pressure Kit Devi     TAKE these medications   acetaminophen 160 MG/5ML solution Commonly known as: TYLENOL Take  31.3 mLs (1,000 mg total) by mouth every 6 (six) hours for 10 days.   iron polysaccharides 150 MG capsule Commonly known as: NIFEREX Take 1 capsule (150 mg total) by mouth daily.   NataChew 28-1 MG Chew Chew 1 tablet by mouth daily.   oxyCODONE 5 MG immediate release tablet Commonly known as: Oxy IR/ROXICODONE Take 1 tablet (5 mg total) by mouth every 6 (six) hours as needed for up to 5 days for severe pain.        Discharge home in stable condition Infant Feeding: Breast Infant Disposition:home with mother Discharge instruction: per After Visit Summary and Postpartum booklet. Activity: Advance as tolerated. Pelvic rest for 6 weeks.  Diet: routine diet Anticipated Birth Control: Nexplanon inserted prior to discharge.  Postpartum Appointment:6 weeks Additional Postpartum F/U: Incision check 1 week Future Appointments:No future appointments. Follow up Visit:  Follow-up Information    Sanjuana Kava, MD. Schedule an appointment as soon as possible for a visit in 1 week(s).   Specialty: Obstetrics and Gynecology Why: Make appt to see Dr. Alwyn Pea in 1 week for incision check and then again in 6 weeks for postpartum visit.  Contact information: [address] [place] [address] [phone]                   10/13/2020 Arrie Eastern, CNM

## 2020-10-13 NOTE — Procedures (Addendum)
Nexplanon insertion Patient positioned supine w/ HOB elevated 30 degrees, left arm extended, site above tricep prepped with betadine, and 15mL of 1% buffered lidocaine used to anesthetize site.  Nexplanon inserted per protocol w/ caution to avoid sulcus. Pt tolerated the procedure well. Hemostasis noted at the end of procedure.  Nexplanon palpated by myself and the patient. Insertion card completed and copy provided to pt. Pt have implant removed in 3 yrs.   Rhea Pink, MSN, CNM 10/13/2020 5:22 PM

## 2020-10-15 LAB — SURGICAL PATHOLOGY

## 2020-10-15 MED FILL — Heparin Sodium (Porcine) Inj 1000 Unit/ML: INTRAMUSCULAR | Qty: 30 | Status: AC

## 2020-10-15 MED FILL — Sodium Chloride IV Soln 0.9%: INTRAVENOUS | Qty: 1000 | Status: AC

## 2021-04-18 ENCOUNTER — Other Ambulatory Visit: Payer: Self-pay

## 2021-04-18 ENCOUNTER — Encounter (HOSPITAL_COMMUNITY): Payer: Self-pay | Admitting: *Deleted

## 2021-04-18 ENCOUNTER — Ambulatory Visit (INDEPENDENT_AMBULATORY_CARE_PROVIDER_SITE_OTHER): Payer: Medicaid Other

## 2021-04-18 ENCOUNTER — Ambulatory Visit (HOSPITAL_COMMUNITY)
Admission: EM | Admit: 2021-04-18 | Discharge: 2021-04-18 | Disposition: A | Discharge status: Home / Self Care | Payer: Medicaid Other

## 2021-04-18 DIAGNOSIS — S93402A Sprain of unspecified ligament of left ankle, initial encounter: Secondary | ICD-10-CM | POA: Diagnosis not present

## 2021-04-18 DIAGNOSIS — M25572 Pain in left ankle and joints of left foot: Secondary | ICD-10-CM | POA: Diagnosis not present

## 2021-04-18 MED ORDER — IBUPROFEN 600 MG PO TABS: 600.0000 mg | ORAL_TABLET | Freq: Four times a day (QID) | ORAL | 0 refills | Status: DC | PRN

## 2021-04-18 NOTE — Discharge Instructions (Addendum)
Wear the ankle brace while standing or walking for comfort.  You can take the Ibuprofen 3 times a day as needed for pain.  You can also take Tylenol.    Rest as much as possible Ice for 10-15 minutes every 4-6 hours as needed for pain and swelling Compression- use an ace bandage or splint for comfort Elevate above your hip/heart when sitting and laying down  Follow up with sports medicine or orthopedics if symptoms do not improve in the next week.

## 2021-04-18 NOTE — ED Triage Notes (Signed)
Pt denies injury.  C/O constant left lateral ankle pain radiating up into left lower leg onset yesterday; states was working on her feet all day yesterday.  LLE CMS intact; all LLE toes warm, with prompt cap refill.

## 2021-04-18 NOTE — ED Provider Notes (Signed)
[facility]    CSN: [id number] Arrival date & time: 04/18/21  0907      History   Chief Complaint Chief Complaint  Patient presents with   Ankle Pain    HPI Gwendolyn Lynch is a 35 y.o. female.   Patient here for evaluation of left ankle pain that started yesterday.  Reports pain to left lateral ankle that radiates up leg and swelling.  Denies any known injury but does report working on her feet.  Reports taking Aleve with minimal to no relief.  Reports pain is constant and achy and worse with movement.  Denies any fevers, chest pain, shortness of breath, N/V/D, numbness, tingling, weakness, abdominal pain, or headaches.     The history is provided by the patient.  Ankle Pain  Past Medical History:  Diagnosis Date   Anemia    Hemorrhoids    Hyperemesis gravidarum    Hypertension affecting pregnancy    Has had hypertension during previous pregnancies   Tachycardia    Thalassemia carrier     Patient Active Problem List   Diagnosis Date Noted   IDA (iron deficiency anemia) 10/13/2020   Delivery by elective cesarean section 10/11/2020   Encounter for maternal care for scar from repeat cesarean delivery 10/11/2020   Hypertension affecting pregnancy, antepartum 05/03/2020   BMI 32.0-32.9,adult 04/05/2020   Supervision of high risk pregnancy, antepartum 03/19/2020   Obesity affecting pregnancy 03/19/2020   Hypertension    History of cesarean delivery, currently pregnant 04/22/2017   Thalassemia carrier 12/07/2016    Past Surgical History:  Procedure Laterality Date   CESAREAN SECTION     C/S x 3   CESAREAN SECTION N/A 11/01/2015   Procedure: CESAREAN SECTION;  Surgeon: Jaymes Graff, MD;  Location: [facility];  Service: Obstetrics;  Laterality: N/A;   CESAREAN SECTION N/A 04/22/2017   Procedure: REPEAT CESAREAN SECTION;  Surgeon: Federico Flake, MD;  Location: [facility];  Service: Obstetrics;  Laterality: N/A;   CESAREAN SECTION N/A  10/11/2020   Procedure: CESAREAN SECTION;  Surgeon: Essie Hart, MD;  Location: [facility];  Service: Obstetrics;  Laterality: N/A;  REQUESTING 3 HOURS   CESAREAN SECTION     EVENT MONITOR  05/2020   Rhythm was normal sinus.  Average heart rate 92 bpm, max heart rate 166 bpm with minimum heart rate of 68 bpm.  Mostly sinus rhythm.  PACs and PVCs.  No arrhythmia noted.   NEXPLANON TRAY  10/13/2020       TRANSTHORACIC ECHOCARDIOGRAM  05/22/2020   EF 55 to 60%.  Normal diastolic parameters.  Normal right ventricle.  Normal valves.    OB History     Gravida  6   Para  6   Term  6   Preterm  0   AB  0   Living  6      SAB  0   IAB  0   Ectopic  0   Multiple  0   Live Births  6            Home Medications    Prior to Admission medications   Medication Sig Start Date End Date Taking? Authorizing Provider  naproxen sodium (ALEVE) 220 MG tablet Take 220 mg by mouth.   Yes [provider]  iron polysaccharides (NIFEREX) 150 MG capsule Take 1 capsule (150 mg total) by mouth daily. 10/13/20 01/11/21  Roma Schanz, CNM  Prenatal Vit-Fe Fum-Fe Bisg-FA (NATACHEW) 28-1 MG CHEW  Chew 1 tablet by mouth daily. 03/19/20   Adam Phenix, MD    Family History Family History  Problem Relation Age of Onset   Diabetes Mother    Hypertension Mother    Diabetes Father    Hypertension Father     Social History Social History   Tobacco Use   Smoking status: Never   Smokeless tobacco: Never  Vaping Use   Vaping Use: Never used  Substance Use Topics   Alcohol use: No   Drug use: No     Allergies   Patient has no known allergies.   Review of Systems Review of Systems  Musculoskeletal:  Positive for arthralgias and joint swelling.  All other systems reviewed and are negative.   Physical Exam Triage Vital Signs ED Triage Vitals [04/18/21 0933]  Enc Vitals Group     BP (!) 147/100     Pulse Rate 62     Resp 18     Temp 98.6 F (37 C)     Temp Source  Oral     SpO2 98 %     Weight      Height      Head Circumference      Peak Flow      Pain Score 10     Pain Loc      Pain Edu?      Excl. in GC?    No data found.  Updated Vital Signs BP (!) 150/92 (BP Location: Left Arm)   Pulse 62   Temp 98.6 F (37 C) (Oral)   Resp 18   SpO2 98%   Breastfeeding No   Visual Acuity Right Eye Distance:   Left Eye Distance:   Bilateral Distance:    Right Eye Near:   Left Eye Near:    Bilateral Near:     Physical Exam Vitals and nursing note reviewed.  Constitutional:      General: She is not in acute distress.    Appearance: Normal appearance. She is not ill-appearing, toxic-appearing or diaphoretic.  HENT:     Head: Normocephalic and atraumatic.  Eyes:     Conjunctiva/sclera: Conjunctivae normal.  Cardiovascular:     Rate and Rhythm: Normal rate.     Pulses: Normal pulses.  Pulmonary:     Effort: Pulmonary effort is normal.  Abdominal:     General: Abdomen is flat.  Musculoskeletal:     Cervical back: Normal range of motion.     Right ankle: Normal.     Right Achilles Tendon: Normal.     Left ankle: Swelling present. Tenderness present over the lateral malleolus. Decreased range of motion. Normal pulse.     Left Achilles Tendon: Normal.  Skin:    General: Skin is warm and dry.  Neurological:     General: No focal deficit present.     Mental Status: She is alert and oriented to person, place, and time.  Psychiatric:        Mood and Affect: Mood normal.     UC Treatments / Results  Labs (all labs ordered are listed, but only abnormal results are displayed) Labs Reviewed - No data to display  EKG   Radiology No results found.  Procedures Procedures (including critical care time)  Medications Ordered in UC Medications - No data to display  Initial Impression / Assessment and Plan / UC Course  I have reviewed the triage vital signs and the nursing notes.  Pertinent labs & imaging results that  were  available during my care of the patient were reviewed by me and considered in my medical decision making (see chart for details).    Xray with no acute bony abnormality.  Assessment negative for red flags or concerns.  Likely left ankle sprain.  Ankle brace applied in office and patient instructed to wear brace for comfort, especially when walking or standing.  May take Ibuprofen as needed for pain. Recommend rest, ice, compression and elevation. If symptoms do not improve in the next week, follow up with ortho.  Final Clinical Impressions(s) / UC Diagnoses   Final diagnoses:  None   Discharge Instructions   None    ED Prescriptions   None    PDMP not reviewed this encounter.   Ivette Loyal, NP 04/18/21 1049

## 2021-08-23 ENCOUNTER — Ambulatory Visit (HOSPITAL_COMMUNITY)
Admission: EM | Admit: 2021-08-23 | Discharge: 2021-08-23 | Disposition: A | Discharge status: Home / Self Care | Payer: Medicaid Other | Attending: Physician Assistant | Admitting: Physician Assistant

## 2021-08-23 ENCOUNTER — Encounter (HOSPITAL_COMMUNITY): Payer: Self-pay | Admitting: *Deleted

## 2021-08-23 ENCOUNTER — Other Ambulatory Visit: Payer: Self-pay

## 2021-08-23 DIAGNOSIS — R6884 Jaw pain: Secondary | ICD-10-CM

## 2021-08-23 MED ORDER — IBUPROFEN 600 MG PO TABS: 600.0000 mg | ORAL_TABLET | Freq: Three times a day (TID) | ORAL | 0 refills | Status: DC | PRN

## 2021-08-23 NOTE — Discharge Instructions (Signed)
Unfortunately, we do not have the ability to take an x-ray of your jaw and I am concerned that there may be something going on since you are having so much pain and difficulty opening your mouth.  I do recommend that you go to the emergency room for further evaluation.  You can use over-the-counter medication for symptom management such as Tylenol.  I called in some ibuprofen and he should not take additional NSAIDs with this medication due to risk of GI bleeding including aspirin, Profen/Advil, naproxen/Aleve.  As we discussed, you do need to go to the emergency room for further evaluation.
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
## 2021-12-28 IMAGING — US US OB COMP LESS 14 WK
1 series · 15 of 28 positions shown · non-contrast
Comparison: None

CLINICAL DATA: Bleeding

EXAM:
OBSTETRIC <14 WK ULTRASOUND
TECHNIQUE: Transabdominal ultrasound was performed for evaluation of the
gestation as well as the maternal uterus and adnexal regions.

[Series 1: us ob comp less 14 wk · 36 acquisitions, 15 frames shown]
[im 1/36]
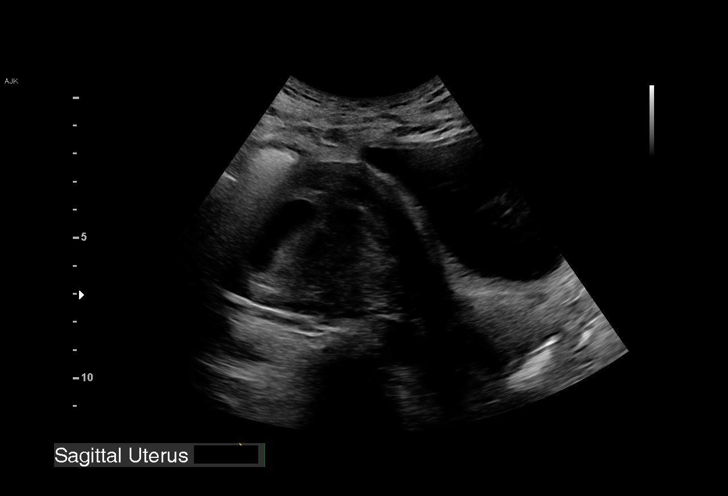
[im 3/36]
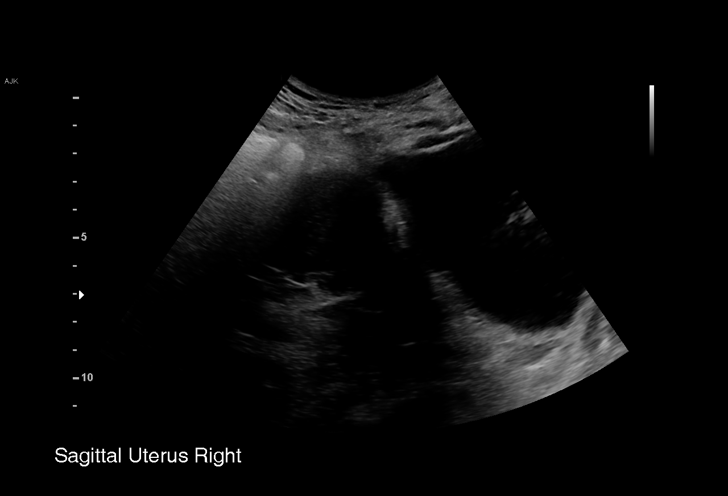
[im 6/36]
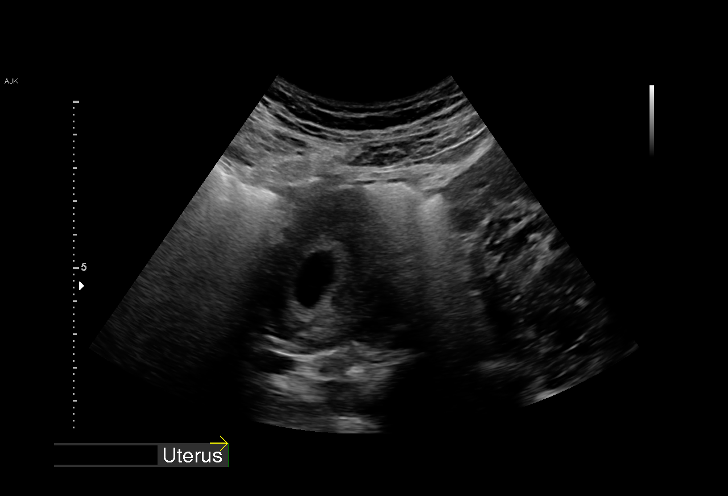
[im 8/36]
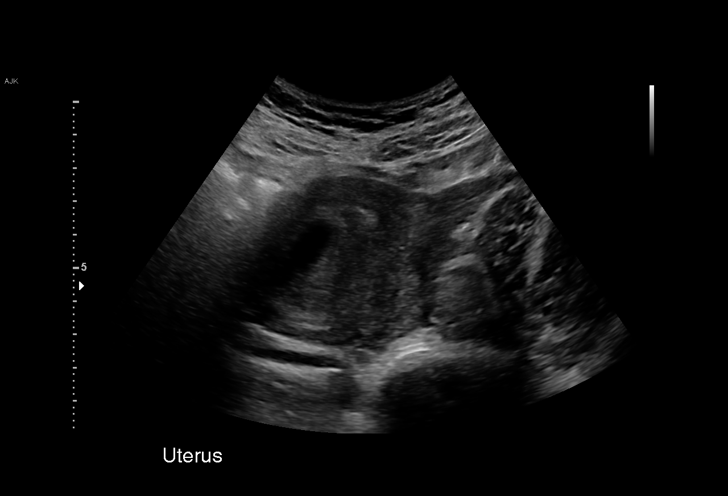
[im 11/36]
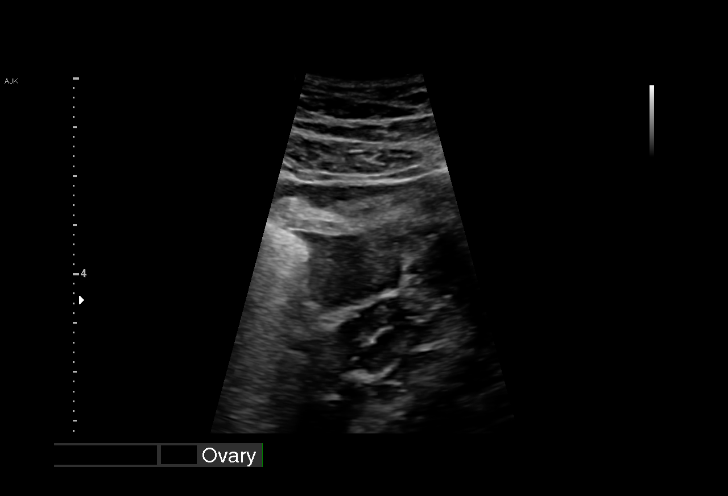
[im 13/36]
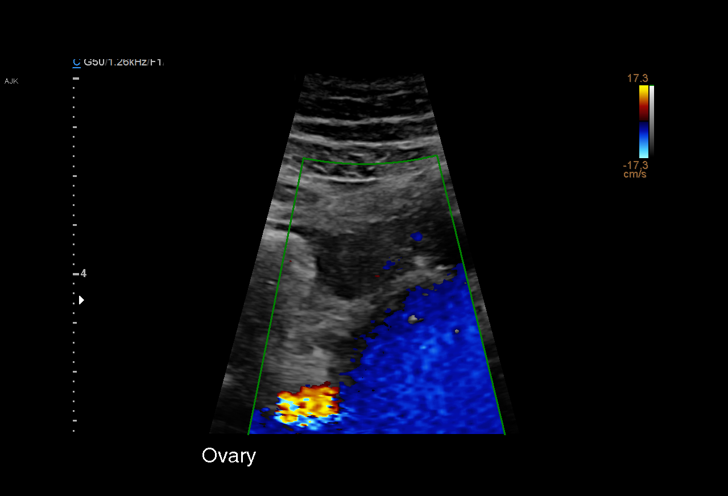
[im 16/36]
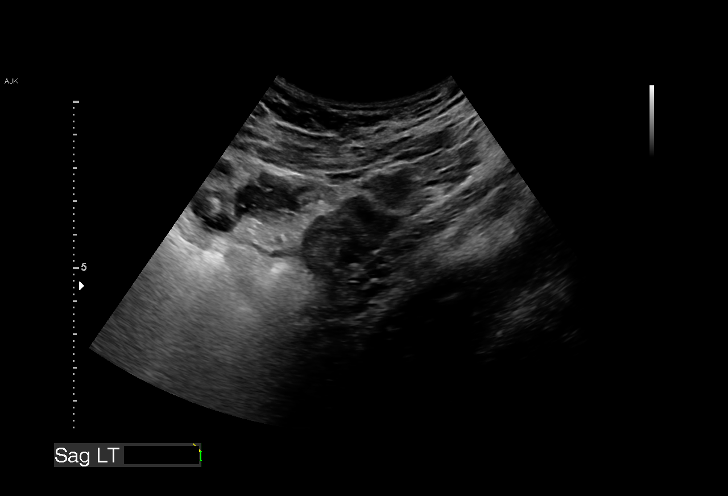
[im 19/36]
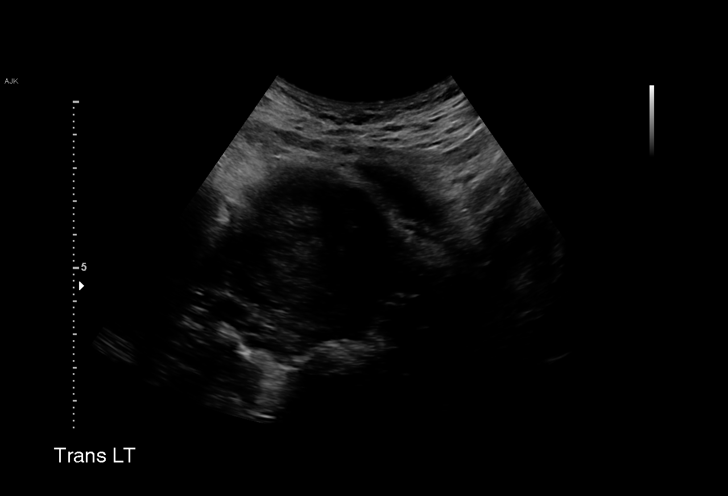
[im 20/36]
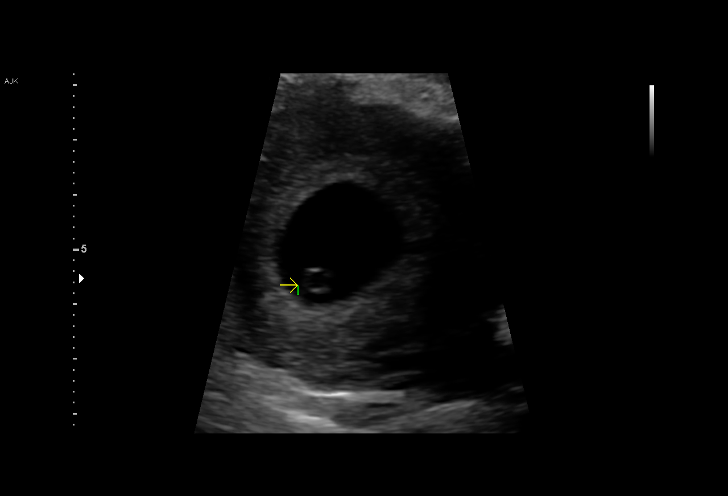
[im 23/36]
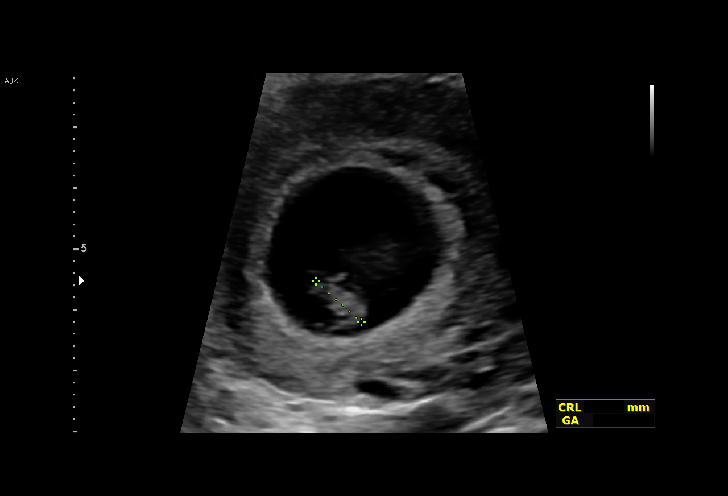
[im 25/36]
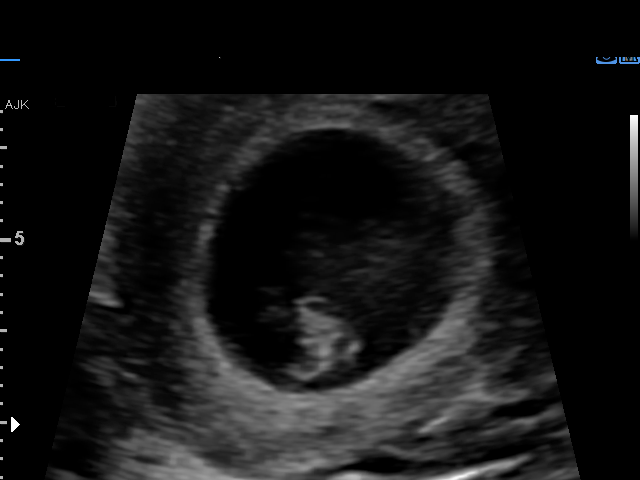
[im 28/36]
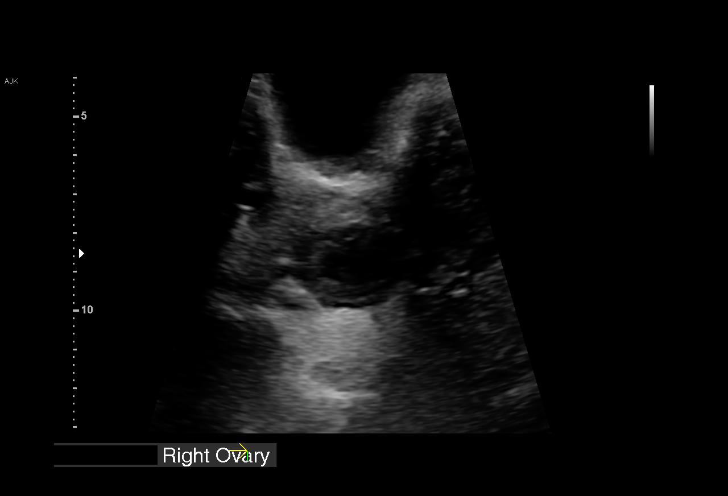
[im 30/36]
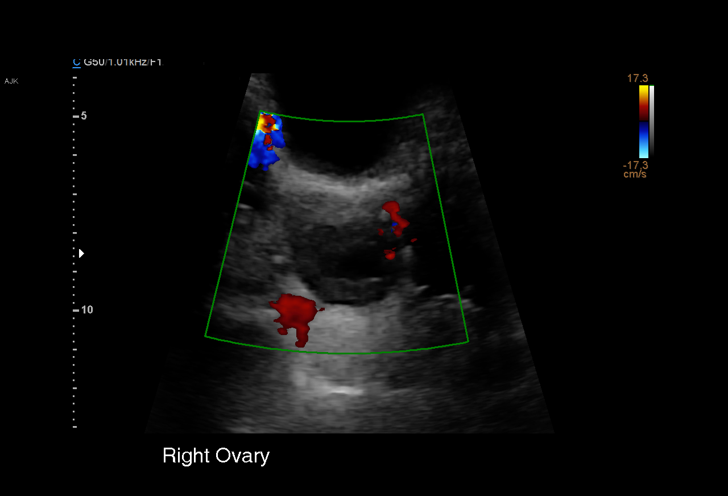
[im 33/36]
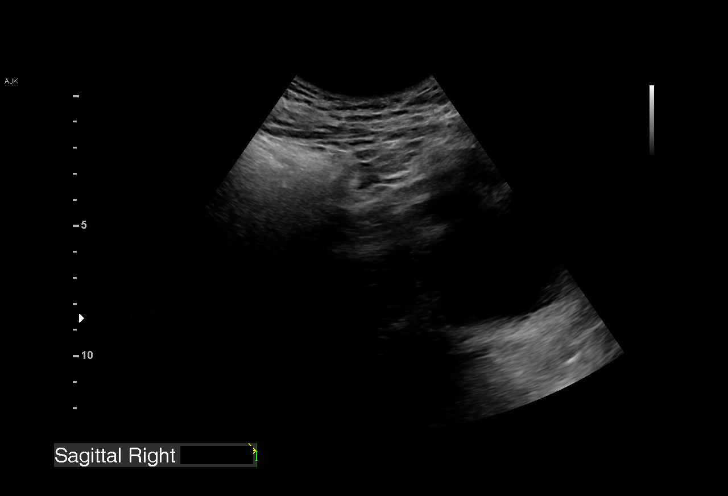
[im 36/36]
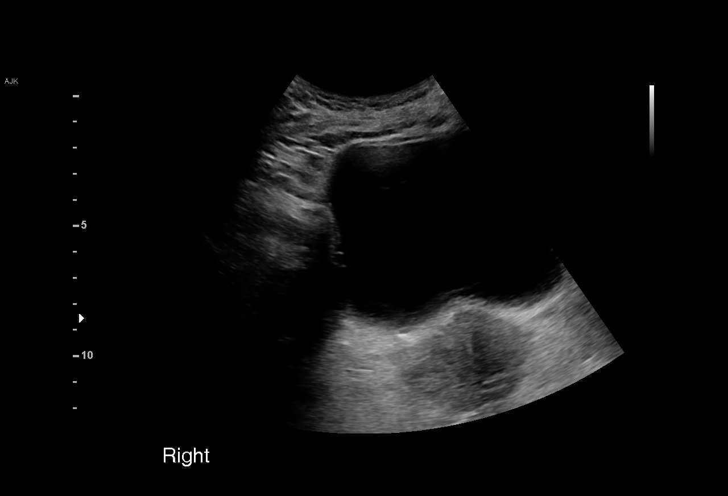

[15 of 28 positions shown; findings below may reference images not displayed]

FINDINGS: Intrauterine gestational sac: Single

Yolk sac:  Visualized

Embryo:  Visualized

Cardiac Activity: Visualized

Heart Rate: 133 bpm

MSD:    mm    w     d

CRL:   10.1 mm   7 w 1 d                  US EDC: 10/25/2020

Subchorionic hemorrhage:  None visualized.

Maternal uterus/adnexae: No adnexal mass or free fluid
IMPRESSION: Seven week 1 day intrauterine pregnancy. Fetal heart rate 133 beats
per minute. No acute maternal findings.

## 2022-01-11 IMAGING — US US OB COMP LESS 14 WK
1 series · 15 of 28 positions shown · non-contrast
Comparison: Obstetric ultrasound 03/09/2020

CLINICAL DATA: Pregnant patient in first-trimester pregnancy with
vaginal bleeding.

EXAM:
OBSTETRIC <14 WK ULTRASOUND
TECHNIQUE: Transabdominal ultrasound was performed for evaluation of the
gestation as well as the maternal uterus and adnexal regions.

[Series 1: us ob comp less 14 wk · 39 acquisitions, 15 frames shown]
[im 1/39]
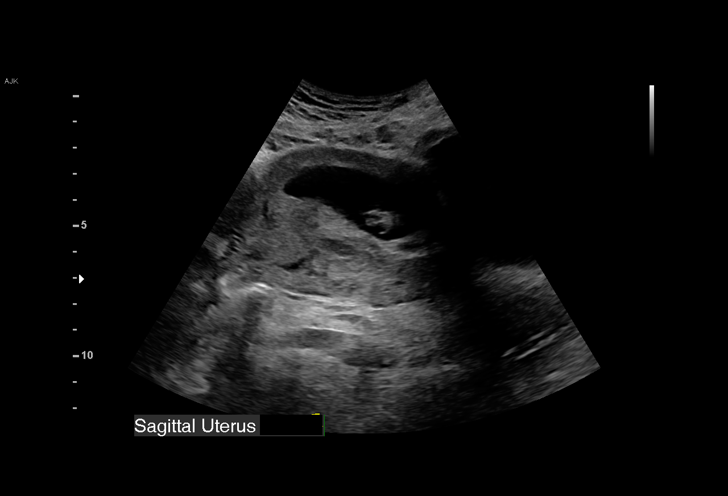
[im 3/39]
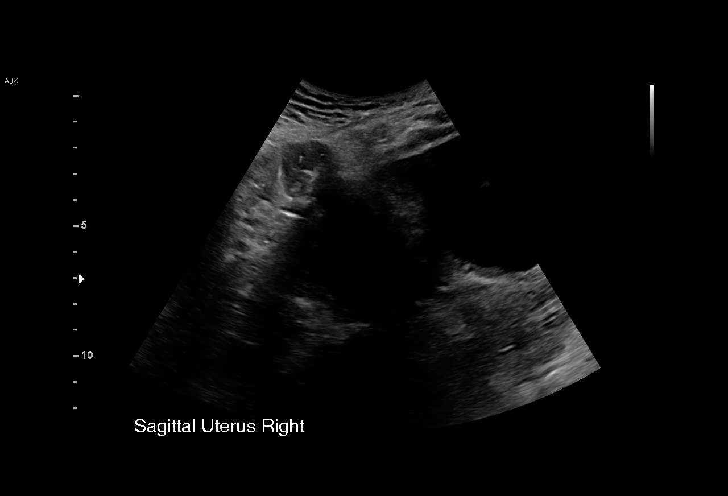
[im 6/39]
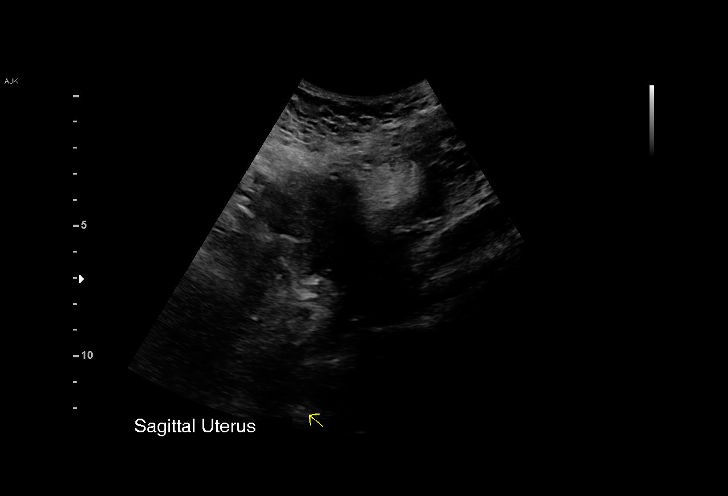
[im 9/39]
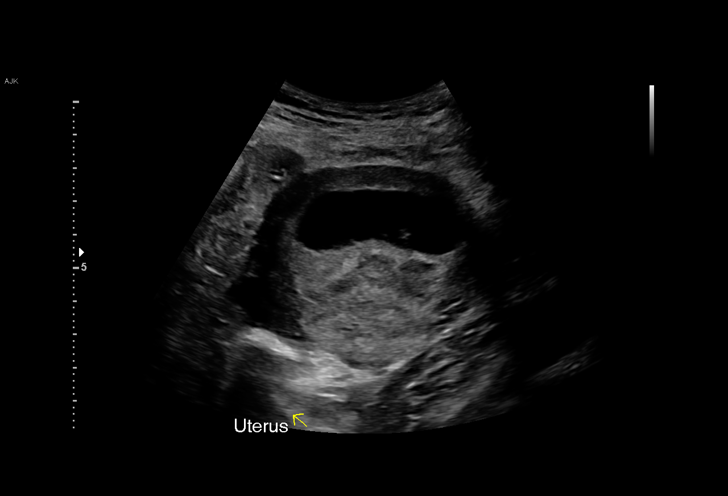
[im 12/39]
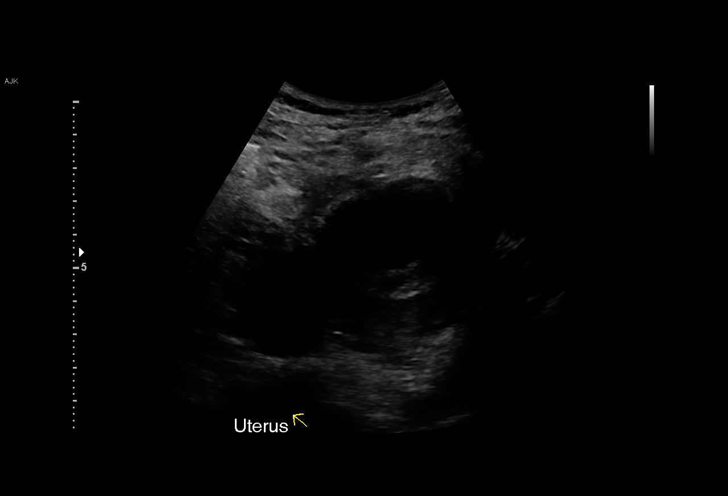
[im 15/39]
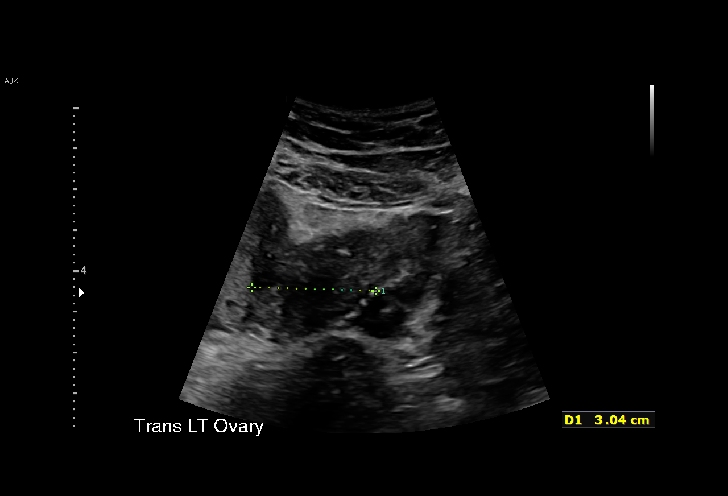
[im 17/39]
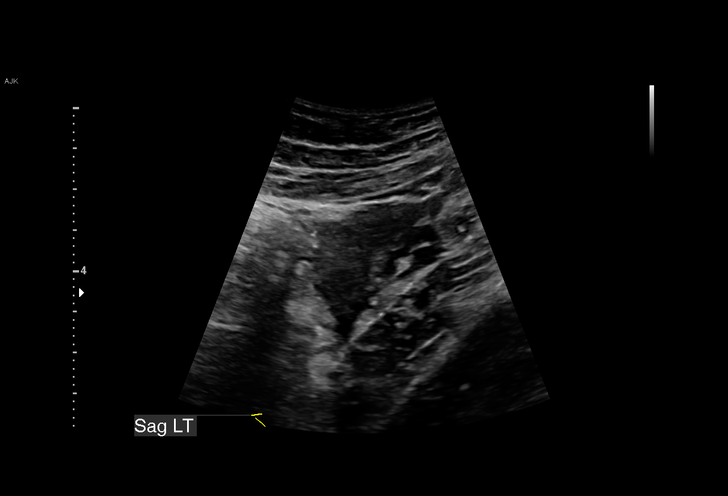
[im 20/39]
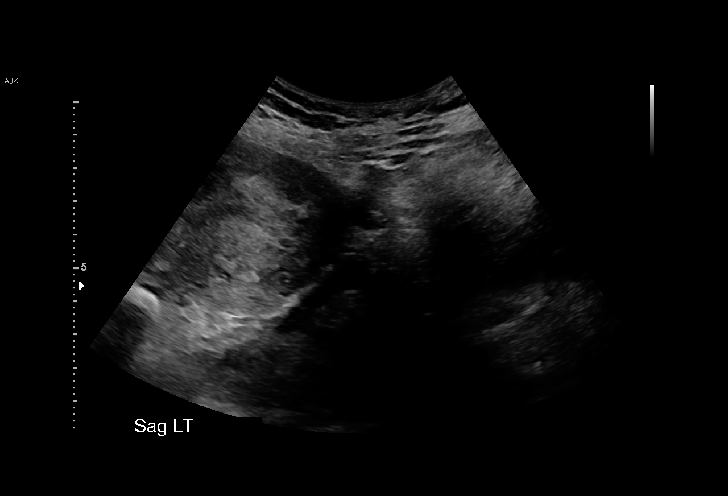
[im 22/39]
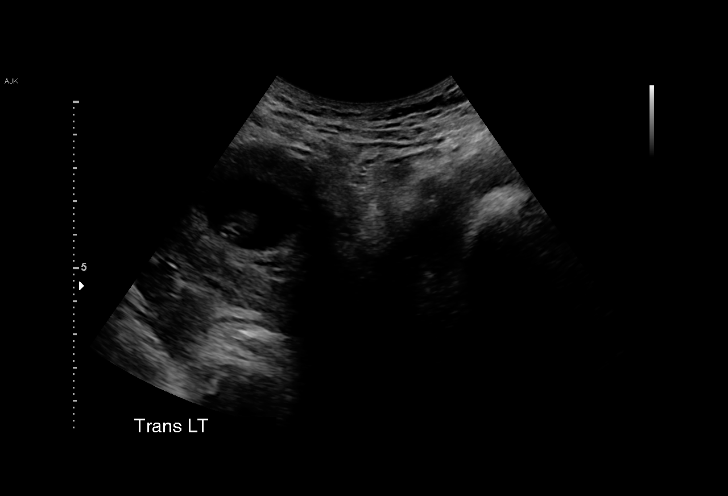
[im 24/39]
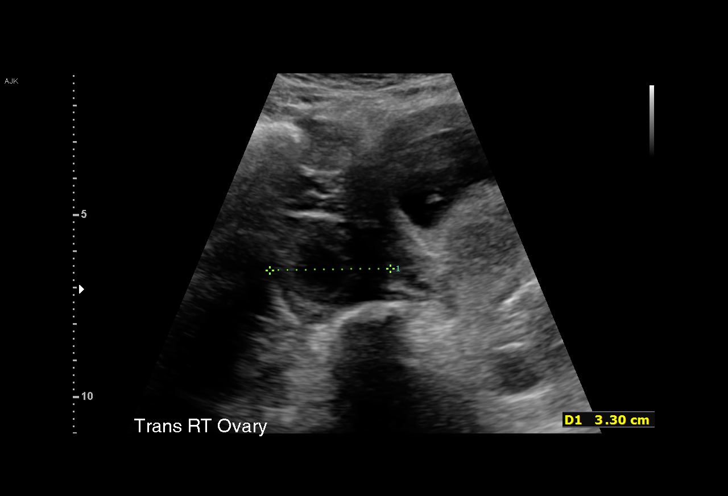
[im 27/39]
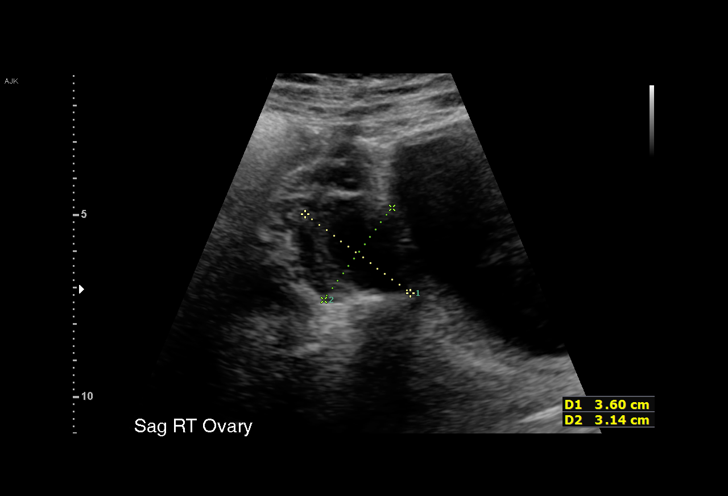
[im 30/39]
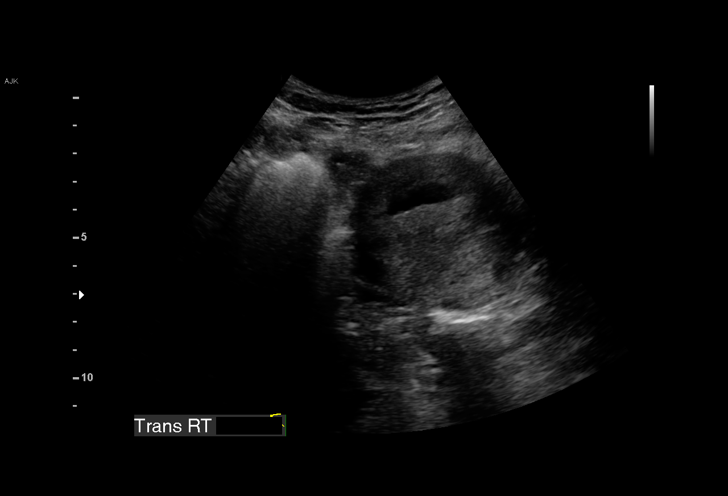
[im 33/39]
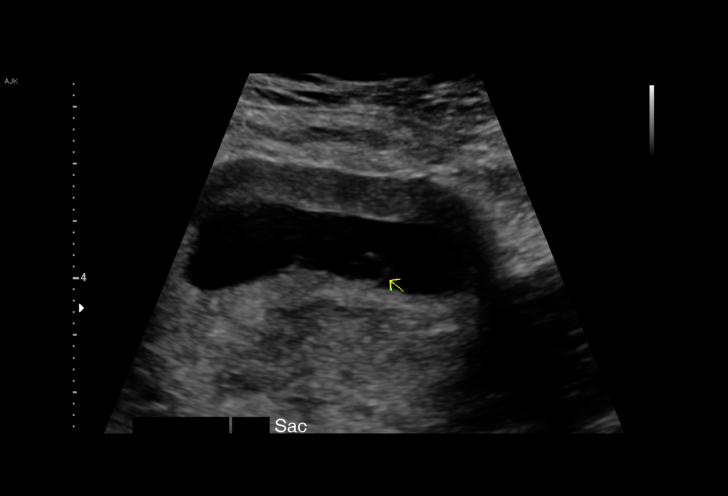
[im 36/39]
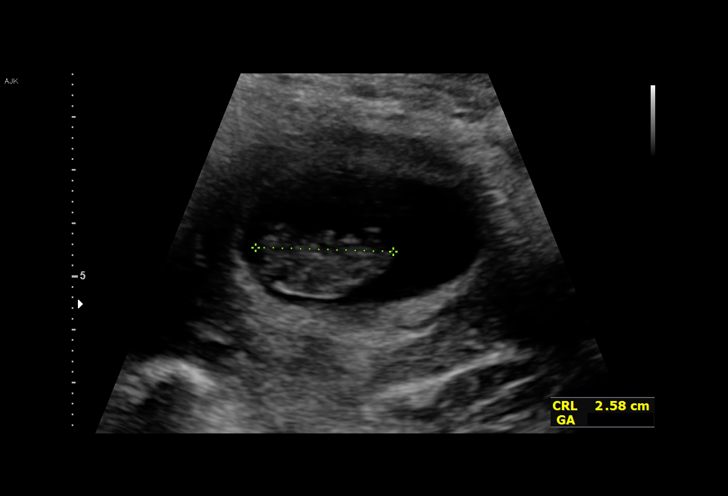
[im 39/39]
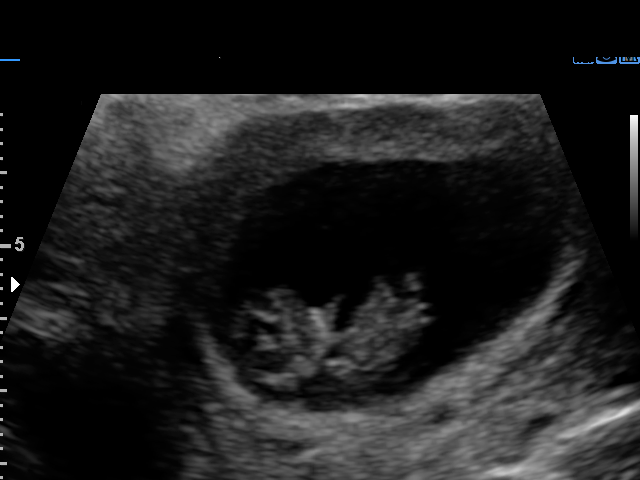

[15 of 28 positions shown; findings below may reference images not displayed]

FINDINGS: Intrauterine gestational sac: Single

Yolk sac:  Visualized.

Embryo:  Visualized.

Cardiac Activity: Visualized.

Heart Rate: 164 bpm

CRL:   24.6 mm   9 w 1 d                  US EDC: 10/25/2020

Subchorionic hemorrhage:  None visualized.

Maternal uterus/adnexae: Both ovaries are visualized and are normal.
There is ovarian blood flow. No adnexal mass or pelvic free fluid.
IMPRESSION: Single live intrauterine pregnancy estimated gestational age 9 weeks
1 day for ultrasound EDC 10/25/2020. There has been interval fetal
growth from prior exam. No subchorionic hemorrhage.

## 2022-12-25 ENCOUNTER — Ambulatory Visit (HOSPITAL_COMMUNITY)
Admission: EM | Admit: 2022-12-25 | Discharge: 2022-12-25 | Disposition: A | Discharge status: Home / Self Care | Payer: Medicaid Other | Attending: Internal Medicine | Admitting: Internal Medicine

## 2022-12-25 ENCOUNTER — Encounter (HOSPITAL_COMMUNITY): Payer: Self-pay

## 2022-12-25 DIAGNOSIS — K645 Perianal venous thrombosis: Secondary | ICD-10-CM | POA: Diagnosis not present

## 2022-12-25 MED ORDER — HYDROCORTISONE ACETATE 25 MG RE SUPP: 25.0000 mg | Freq: Two times a day (BID) | RECTAL | 0 refills | Status: DC

## 2022-12-25 NOTE — Discharge Instructions (Addendum)
Sitz bath's 1-2 times a day Please use Anusol suppository as directed Increase fiber and fruit intake to prevent constipation You may take ibuprofen or Tylenol as needed for pain If you have worsening pain, bleeding or constipation please return to urgent care to be reevaluated.

## 2022-12-25 NOTE — ED Provider Notes (Signed)
[name]    CSN: [id number] Arrival date & time: 12/25/22  1047      History   Chief Complaint Chief Complaint  Patient presents with   Rectal Bleeding    HPI Gwendolyn Lynch is a 37 y.o. female with a history of internal hemorrhoids comes to urgent care with rectal bleeding of 4 days duration.  Patient symptoms started 4 days ago and has been persistent.  She has been using Preparation H cream over-the-counter with no improvement in her symptoms.  She started bleeding rectally over the past few days.  Bleeding is not provoked by bowel movements.  She denies any history of constipation.  She is currently not constipated.  No abdominal pain or bloating.  Patient denies any rectal pain but endorses rectal discomfort.   HPI  Past Medical History:  Diagnosis Date   Anemia    Hemorrhoids    Hyperemesis gravidarum    Hypertension affecting pregnancy    Has had hypertension during previous pregnancies   Tachycardia    Thalassemia carrier     Patient Active Problem List   Diagnosis Date Noted   IDA (iron deficiency anemia) 10/13/2020   Delivery by elective cesarean section 10/11/2020   Encounter for maternal care for scar from repeat cesarean delivery 10/11/2020   Hypertension affecting pregnancy, antepartum 05/03/2020   BMI 32.0-32.9,adult 04/05/2020   Supervision of high risk pregnancy, antepartum 03/19/2020   Obesity affecting pregnancy 03/19/2020   Hypertension    History of cesarean delivery, currently pregnant 04/22/2017   Thalassemia carrier 12/07/2016    Past Surgical History:  Procedure Laterality Date   CESAREAN SECTION     C/S x 3   CESAREAN SECTION N/A 11/01/2015   Procedure: CESAREAN SECTION;  Surgeon: Crawford Givens, MD;  Location: [facility];  Service: Obstetrics;  Laterality: N/A;   CESAREAN SECTION N/A 04/22/2017   Procedure: REPEAT CESAREAN SECTION;  Surgeon: Caren Macadam, MD;  Location: [facility];  Service: Obstetrics;   Laterality: N/A;   CESAREAN SECTION N/A 10/11/2020   Procedure: CESAREAN SECTION;  Surgeon: Sanjuana Kava, MD;  Location: [facility];  Service: Obstetrics;  Laterality: N/A;  REQUESTING 3 HOURS   CESAREAN SECTION     EVENT MONITOR  05/2020   Rhythm was normal sinus.  Average heart rate 92 bpm, max heart rate 166 bpm with minimum heart rate of 68 bpm.  Mostly sinus rhythm.  PACs and PVCs.  No arrhythmia noted.   NEXPLANON TRAY  10/13/2020       TRANSTHORACIC ECHOCARDIOGRAM  05/22/2020   EF 55 to 60%.  Normal diastolic parameters.  Normal right ventricle.  Normal valves.    OB History     Gravida  6   Para  6   Term  6   Preterm  0   AB  0   Living  6      SAB  0   IAB  0   Ectopic  0   Multiple  0   Live Births  6            Home Medications    Prior to Admission medications   Medication Sig Start Date End Date Taking? Authorizing Provider  hydrocortisone (ANUSOL-HC) 25 MG suppository Place 1 suppository (25 mg total) rectally 2 (two) times daily. 12/25/22  Yes Rhylei Mcquaig, Myrene Galas, MD  ibuprofen (ADVIL) 600 MG tablet Take 1 tablet (600 mg total) by mouth every 8 (eight) hours as needed. 08/23/21  Raspet, Erin K, PA-C  iron polysaccharides (NIFEREX) 150 MG capsule Take 1 capsule (150 mg total) by mouth daily. 10/13/20 01/11/21  Arrie Eastern, CNM  Prenatal Vit-Fe Fum-Fe Bisg-FA (NATACHEW) 28-1 MG CHEW Chew 1 tablet by mouth daily. 03/19/20   Woodroe Mode, MD    Family History Family History  Problem Relation Age of Onset   Diabetes Mother    Hypertension Mother    Diabetes Father    Hypertension Father     Social History Social History   Tobacco Use   Smoking status: Never   Smokeless tobacco: Never  Vaping Use   Vaping Use: Never used  Substance Use Topics   Alcohol use: No   Drug use: No     Allergies   Patient has no known allergies.   Review of Systems Review of Systems As per HPI  Physical Exam Triage Vital Signs ED Triage Vitals  [12/25/22 1116]  Enc Vitals Group     BP 135/71     Pulse Rate 63     Resp 18     Temp 98.3 F (36.8 C)     Temp Source Oral     SpO2 95 %     Weight      Height      Head Circumference      Peak Flow      Pain Score      Pain Loc      Pain Edu?      Excl. in [place]?    No data found.  Updated Vital Signs BP 135/71 (BP Location: Left Arm)   Pulse 63   Temp 98.3 F (36.8 C) (Oral)   Resp 18   SpO2 95%   Visual Acuity Right Eye Distance:   Left Eye Distance:   Bilateral Distance:    Right Eye Near:   Left Eye Near:    Bilateral Near:     Physical Exam Vitals and nursing note reviewed. Exam conducted with a chaperone present.  Genitourinary:    Comments: Tender hemorrhoidal swelling noticed in the 11 o'clock position.  Measures about 1 inch in diameter. Skin:    General: Skin is warm.      UC Treatments / Results  Labs (all labs ordered are listed, but only abnormal results are displayed) Labs Reviewed - No data to display  EKG   Radiology No results found.  Procedures Procedures (including critical care time)  Medications Ordered in UC Medications - No data to display  Initial Impression / Assessment and Plan / UC Course  I have reviewed the triage vital signs and the nursing notes.  Pertinent labs & imaging results that were available during my care of the patient were reviewed by me and considered in my medical decision making (see chart for details).     1.  Thrombosed internal hemorrhoid: Anusol suppository Sitz bath's once to twice daily Tylenol or Motrin as needed for pain Increase fiber intake Return precautions given. Final Clinical Impressions(s) / UC Diagnoses   Final diagnoses:  Hemorrhoids, internal, thrombosed     Discharge Instructions      Sitz bath's 1-2 times a day Please use Anusol suppository as directed Increase fiber and fruit intake to prevent constipation You may take ibuprofen or Tylenol as needed for pain If  you have worsening pain, bleeding or constipation please return to urgent care to be reevaluated.   ED Prescriptions     Medication Sig Dispense Auth. Provider  hydrocortisone (ANUSOL-HC) 25 MG suppository Place 1 suppository (25 mg total) rectally 2 (two) times daily. 12 suppository [name], Myrene Galas, MD      PDMP not reviewed this encounter.   Chase Picket, MD 12/25/22 1224

## 2022-12-25 NOTE — ED Triage Notes (Signed)
Pt stated she has some rectal bleeding x 4 days. Pt stated she has a history of Hemorrhoids. Pt reports she is using Preparation H.

## 2024-01-26 ENCOUNTER — Emergency Department (HOSPITAL_COMMUNITY)

## 2024-01-26 ENCOUNTER — Emergency Department (HOSPITAL_COMMUNITY): Admission: EM | Admit: 2024-01-26 | Discharge: 2024-01-26 | Disposition: A | Discharge status: Home / Self Care

## 2024-01-26 ENCOUNTER — Other Ambulatory Visit: Payer: Self-pay

## 2024-01-26 ENCOUNTER — Encounter (HOSPITAL_COMMUNITY): Payer: Self-pay | Admitting: Emergency Medicine

## 2024-01-26 DIAGNOSIS — R519 Headache, unspecified: Secondary | ICD-10-CM | POA: Diagnosis present

## 2024-01-26 DIAGNOSIS — I10 Essential (primary) hypertension: Secondary | ICD-10-CM | POA: Diagnosis not present

## 2024-01-26 LAB — CBC
HCT: 38.7 % (ref 36.0–46.0)
Hemoglobin: 12.2 g/dL (ref 12.0–15.0)
MCH: 25.9 pg — ABNORMAL LOW (ref 26.0–34.0)
MCHC: 31.5 g/dL (ref 30.0–36.0)
MCV: 82.2 fL (ref 80.0–100.0)
Platelets: 256 10*3/uL (ref 150–400)
RBC: 4.71 MIL/uL (ref 3.87–5.11)
RDW: 15.6 % — ABNORMAL HIGH (ref 11.5–15.5)
WBC: 7 10*3/uL (ref 4.0–10.5)
nRBC: 0 % (ref 0.0–0.2)

## 2024-01-26 LAB — BASIC METABOLIC PANEL WITH GFR
Anion gap: 8 (ref 5–15)
BUN: 13 mg/dL (ref 6–20)
CO2: 22 mmol/L (ref 22–32)
Calcium: 9 mg/dL (ref 8.9–10.3)
Chloride: 109 mmol/L (ref 98–111)
Creatinine, Ser: 0.85 mg/dL (ref 0.44–1.00)
GFR, Estimated: >60 mL/min (ref >60)
Glucose, Bld: 108 mg/dL — ABNORMAL HIGH (ref 70–99)
Potassium: 3.8 mmol/L (ref 3.5–5.1)
Sodium: 139 mmol/L (ref 135–145)

## 2024-01-26 LAB — HCG, SERUM, QUALITATIVE: Preg, Serum: NEGATIVE

## 2024-01-26 MED ORDER — METOCLOPRAMIDE HCL 5 MG/ML IJ SOLN: 10.0000 mg | Freq: Once | INTRAMUSCULAR | Status: DC

## 2024-01-26 MED ORDER — ACETAMINOPHEN 325 MG PO TABS
650.0000 mg | ORAL_TABLET | Freq: Once | ORAL | Status: AC
  Administered 2024-01-26: 650 mg via ORAL
  Filled 2024-01-26: qty 2

## 2024-01-26 NOTE — ED Notes (Signed)
 Patient transported to CT

## 2024-01-26 NOTE — ED Triage Notes (Signed)
 Pt reports back of her head is hurting and feel off balance for a week. Only has relief when laying down. Endorses nausea. Ambulatory to triage.

## 2024-01-26 NOTE — Discharge Instructions (Addendum)
 Please follow-up with neurology.  Return if develop fevers, chills, worsening headache, vertigo at rest, facial droop, difficulty finding words, unilateral weakness, seizures, lightheadedness, passout, chest pain, shortness of breath, abdominal pain or any new or worsening symptoms that are concerning to you.

## 2024-01-26 NOTE — ED Provider Notes (Signed)
 [place] EMERGENCY DEPARTMENT AT [facility] Provider Note   CSN: [id number] Arrival date & time: 01/26/24  4098     History  Chief Complaint  Patient presents with   Headache    Gwendolyn Lynch is a 38 y.o. female.  38 year old female presenting emergency department for headache.  Denies dural history of headaches.  Reports she has had posterior headache for close to a week.  Reports feeling somewhat off balance, but able to ambulate.  No vertigo, no aphasia, no dysarthria, no unilateral weakness.  Does note for the past 3 years she has had some intermittent numbness to hands.  None currently.  She does note that headache simile feels better while lying down and worsens when upright.  No fevers, chills or URI symptoms.  No chest pain or shortness of breath.   Headache      Home Medications Prior to Admission medications   Not on File      Allergies    Patient has no known allergies.    Review of Systems   Review of Systems  Neurological:  Positive for headaches.    Physical Exam Updated Vital Signs BP (!) 136/98 (BP Location: Right Arm)   Pulse 89   Temp 98.4 F (36.9 C)   Resp 16   Ht 4\' 10"  (1.473 m)   Wt 73 kg   LMP 12/22/2023 (Approximate)   SpO2 100%   BMI 33.65 kg/m  Physical Exam Vitals and nursing note reviewed.  Constitutional:      General: She is not in acute distress.    Appearance: She is not toxic-appearing.  HENT:     Head: Normocephalic.  Eyes:     General: No visual field deficit. Cardiovascular:     Rate and Rhythm: Normal rate and regular rhythm.  Pulmonary:     Effort: Pulmonary effort is normal.  Abdominal:     General: Bowel sounds are normal.     Palpations: Abdomen is soft.  Musculoskeletal:     Cervical back: Normal range of motion and neck supple.  Skin:    General: Skin is warm.  Neurological:     Mental Status: She is alert and oriented to person, place, and time.     GCS: GCS eye subscore is 4. GCS  verbal subscore is 5. GCS motor subscore is 6.     Cranial Nerves: No cranial nerve deficit, dysarthria or facial asymmetry.     Sensory: No sensory deficit.     Motor: No weakness.     Coordination: Romberg sign negative. Coordination normal.     Gait: Gait normal.  Psychiatric:        Mood and Affect: Mood normal.        Behavior: Behavior normal.     ED Results / Procedures / Treatments   Labs (all labs ordered are listed, but only abnormal results are displayed) Labs Reviewed  BASIC METABOLIC PANEL WITH GFR - Abnormal; Notable for the following components:      Result Value   Glucose, Bld 108 (*)    All other components within normal limits  CBC - Abnormal; Notable for the following components:   MCH 25.9 (*)    RDW 15.6 (*)    All other components within normal limits  HCG, SERUM, QUALITATIVE    EKG None  Radiology CT Head Wo Contrast Result Date: 01/26/2024 CLINICAL DATA:  Headache.  Increasing frequency or severity. EXAM: CT HEAD WITHOUT CONTRAST TECHNIQUE: Contiguous axial images  were obtained from the base of the skull through the vertex without intravenous contrast. RADIATION DOSE REDUCTION: This exam was performed according to the departmental dose-optimization program which includes automated exposure control, adjustment of the mA and/or kV according to patient size and/or use of iterative reconstruction technique. COMPARISON:  None Available. FINDINGS: Brain: The brain has normal appearance without evidence of stroke, mass, hemorrhage, hydrocephalus or extra-axial collection. Incidental cavum septum pellucidum, a normal variant. Punctate calcification in the white matter of the left frontoparietal junction without any other adjacent finding, likely to be insignificant. No specific follow-up recommended for this. Vascular: No abnormal vascular finding. Skull: Normal Sinuses/Orbits: Clear/normal Other: None IMPRESSION: Normal head CT. No abnormality seen to explain headache.  Electronically Signed   By: Bettylou Brunner M.D.   On: 01/26/2024 10:19    Procedures Procedures    Medications Ordered in ED Medications  acetaminophen  (TYLENOL ) tablet 650 mg (650 mg Oral Given 01/26/24 1023)    ED Course/ Medical Decision Making/ A&P Clinical Course as of 01/26/24 1046  Tue Jan 26, 2024  1022 CT Head Wo Contrast IMPRESSION: Normal head CT. No abnormality seen to explain headache.   Electronically Signed   By: Bettylou Brunner M.D.   [TY]    Clinical Course User Index [TY] Rolinda Climes, DO                                 Medical Decision Making This is a 38 year old female presenting emergency department for headache.  No significant past medical history.  Slightly hypertensive, but vital signs otherwise reassuring.  Physical exam with normal neuroexam.  Personally ambulated patient, steady gait with no disturbances.  However given history proceeded with CT head.  It was negative for acute pathology.  Reviewed images as well, agreed I do not appreciate overt pathology.  She has no leukocytosis to suggest systemic infection.  She is moving her neck freely.  Low suspicion for meningitis.  Hemoglobin is normal.  No significant metabolic derangements.  Normal kidney function.  Pregnancy test is normal.  Was given Tylenol  here in the emergency department.  Will give follow-up for neurology.  Patient stable for discharge at this time.  Amount and/or Complexity of Data Reviewed External Data Reviewed:     Details: No prior imaging of head in chart.  Also appears to have some gestational hypertension Labs: ordered. Radiology: ordered. Decision-making details documented in ED Course.  Risk OTC drugs.         Final Clinical Impression(s) / ED Diagnoses Final diagnoses:  None    Rx / DC Orders ED Discharge Orders     None         Rolinda Climes, DO 01/26/24 1046

## 2024-01-27 ENCOUNTER — Encounter: Payer: Self-pay | Admitting: Neurology

## 2024-01-29 ENCOUNTER — Encounter: Payer: Self-pay | Admitting: Family

## 2024-01-29 ENCOUNTER — Ambulatory Visit (INDEPENDENT_AMBULATORY_CARE_PROVIDER_SITE_OTHER): Payer: Medicaid Other | Admitting: Family

## 2024-01-29 VITALS — BP 131/85 | HR 77 | Temp 98.5°F | Resp 16 | Ht <= 58 in | Wt 164.2 lb

## 2024-01-29 DIAGNOSIS — Z131 Encounter for screening for diabetes mellitus: Secondary | ICD-10-CM | POA: Diagnosis not present

## 2024-01-29 DIAGNOSIS — G5603 Carpal tunnel syndrome, bilateral upper limbs: Secondary | ICD-10-CM

## 2024-01-29 DIAGNOSIS — Z09 Encounter for follow-up examination after completed treatment for conditions other than malignant neoplasm: Secondary | ICD-10-CM | POA: Diagnosis not present

## 2024-01-29 DIAGNOSIS — Z7689 Persons encountering health services in other specified circumstances: Secondary | ICD-10-CM

## 2024-01-29 DIAGNOSIS — R519 Headache, unspecified: Secondary | ICD-10-CM

## 2024-01-29 LAB — POCT GLYCOSYLATED HEMOGLOBIN (HGB A1C): Hemoglobin A1C: 5.8 % — AB (ref 4.0–5.6)

## 2024-01-29 MED ORDER — GABAPENTIN 300 MG PO CAPS: 300.0000 mg | ORAL_CAPSULE | Freq: Every day | ORAL | 0 refills | Status: AC

## 2024-01-29 MED ORDER — SUMATRIPTAN SUCCINATE 25 MG PO TABS: ORAL_TABLET | ORAL | 2 refills | Status: AC

## 2024-01-29 NOTE — Progress Notes (Signed)
 Subjective:    Gwendolyn Lynch - 38 y.o. female MRN [id number]  Date of birth: 04/01/1986  HPI  Gwendolyn Lynch is to establish care.   Current issues and/or concerns: 01/26/2024 [facility] Emergency Department at [facility] per DO note:   ED Course/ Medical Decision Making/ A&P    Clinical Course as of 01/26/24 1046  Tue Jan 26, 2024  1022 CT Head Wo Contrast IMPRESSION: Normal head CT. No abnormality seen to explain headache.     Electronically Signed   By: Bettylou Brunner M.D.    [TY]     Clinical Course User Index [TY] Rolinda Climes, DO                                  Medical Decision Making This is a 38 year old female presenting emergency department for headache.  No significant past medical history.  Slightly hypertensive, but vital signs otherwise reassuring.  Physical exam with normal neuroexam.  Personally ambulated patient, steady gait with no disturbances.  However given history proceeded with CT head.  It was negative for acute pathology.  Reviewed images as well, agreed I do not appreciate overt pathology.  She has no leukocytosis to suggest systemic infection.  She is moving her neck freely.  Low suspicion for meningitis.  Hemoglobin is normal.  No significant metabolic derangements.  Normal kidney function.  Pregnancy test is normal.  Was given Tylenol  here in the emergency department.  Will give follow-up for neurology.  Patient stable for discharge at this time.   Amount and/or Complexity of Data Reviewed External Data Reviewed:     Details: No prior imaging of head in chart.  Also appears to have some gestational hypertension Labs: ordered. Radiology: ordered. Decision-making details documented in ED Course.   Risk OTC drugs.   Today's office visit 01/29/2024: - Reports headaches persisting since Emergency Department discharge. Denies red flag symptoms. Reports upcoming appointment with Neurology in July 2025.  - Diabetes testing.  - Mild  anemia. Discussed with patient to trial over-the-counter iron  supplement and follow-up in 4 to 6 weeks or sooner if needed. - Bilateral hand numbness and tingling. Denies red flag symptoms. - No further issues/concerns for discussion today.   ROS per HPI   Health Maintenance:   There are no preventive care reminders to display for this patient.    Past Medical History: Patient Active Problem List   Diagnosis Date Noted   IDA (iron  deficiency anemia) 10/13/2020   Delivery by elective cesarean section 10/11/2020   Encounter for maternal care for scar from repeat cesarean delivery 10/11/2020   Hypertension affecting pregnancy, antepartum 05/03/2020   BMI 32.0-32.9,adult 04/05/2020   Supervision of high risk pregnancy, antepartum 03/19/2020   Obesity affecting pregnancy 03/19/2020   Hypertension    History of cesarean delivery, currently pregnant 04/22/2017   Thalassemia carrier 12/07/2016      Social History   reports that she has never smoked. She has never used smokeless tobacco. She reports that she does not drink alcohol and does not use drugs.   Family History  family history includes Diabetes in her father and mother; Hypertension in her father and mother.   Medications: reviewed and updated   Objective:   Physical Exam BP 131/85   Pulse 77   Temp 98.5 F (36.9 C) (Oral)   Resp 16   Ht 4\' 10"  (1.473 m)   Wt  164 lb 3.2 oz (74.5 kg)   LMP 12/22/2023 (Approximate)   SpO2 97%   BMI 34.32 kg/m   Physical Exam HENT:     Head: Normocephalic and atraumatic.     Nose: Nose normal.     Mouth/Throat:     Mouth: Mucous membranes are moist.     Pharynx: Oropharynx is clear.  Eyes:     Extraocular Movements: Extraocular movements intact.     Conjunctiva/sclera: Conjunctivae normal.     Pupils: Pupils are equal, round, and reactive to light.  Cardiovascular:     Rate and Rhythm: Normal rate and regular rhythm.     Pulses: Normal pulses.     Heart sounds: Normal  heart sounds.  Pulmonary:     Effort: Pulmonary effort is normal.     Breath sounds: Normal breath sounds.  Musculoskeletal:        General: Normal range of motion.     Right shoulder: Normal.     Left shoulder: Normal.     Right upper arm: Normal.     Left upper arm: Normal.     Right elbow: Normal.     Left elbow: Normal.     Right forearm: Normal.     Left forearm: Normal.     Right wrist: Normal.     Left wrist: Normal.     Right hand: Normal.     Left hand: Normal.     Cervical back: Normal range of motion and neck supple.  Neurological:     General: No focal deficit present.     Mental Status: She is alert and oriented to person, place, and time.  Psychiatric:        Mood and Affect: Mood normal.        Behavior: Behavior normal.       Assessment & Plan:  1. Encounter to establish care (Primary) - Patient presents today to establish care. During the interim follow-up with primary provider as scheduled.  - Return for annual physical examination, labs, and health maintenance. Arrive fasting meaning having no food for at least 8 hours prior to appointment. You may have only water or black coffee. Please take scheduled medications as normal.  2. Nonintractable headache, unspecified chronicity pattern, unspecified headache type - Sumatriptan as prescribed. Counseled on medication adherence/adverse effects.  - Keep all scheduled appointments with Neurology.  - Follow-up with primary provider as scheduled. - SUMAtriptan (IMITREX) 25 MG tablet; Take 25 mg (1 tablet total) by mouth at the start of the headache. May repeat in 2 hours x 1 if headache persists. Max of 2 tablets/24 hours.  Dispense: 30 tablet; Refill: 2  3. Diabetes mellitus screening - Routine screening.  - POCT glycosylated hemoglobin (Hb A1C); Future  4. Bilateral carpal tunnel syndrome - Gabapentin as prescribed. Counseled on medication adherence/adverse effects.  - Follow-up with primary provider as  scheduled. - gabapentin (NEURONTIN) 300 MG capsule; Take 1 capsule (300 mg total) by mouth at bedtime.  Dispense: 90 capsule; Refill: 0    Patient was given clear instructions to go to Emergency Department or return to medical center if symptoms don't improve, worsen, or new problems develop.The patient verbalized understanding.  I discussed the assessment and treatment plan with the patient. The patient was provided an opportunity to ask questions and all were answered. The patient agreed with the plan and demonstrated an understanding of the instructions.   The patient was advised to call back or seek an in-person evaluation if the  symptoms worsen or if the condition fails to improve as anticipated.    Lavona Pounds, NP 01/29/2024, 9:37 AM [facility] at [facility]

## 2024-01-29 NOTE — Progress Notes (Signed)
 Went to emergency room for headaches Concerns about diabetes, blood cells was high.  Did an cat scan and nothing was abnormal so she was referred  to Nephrology.  Tingling in both arms from finger tips to forearm and they hurt .

## 2024-02-05 ENCOUNTER — Encounter: Payer: Self-pay | Admitting: Family

## 2024-02-05 ENCOUNTER — Ambulatory Visit (INDEPENDENT_AMBULATORY_CARE_PROVIDER_SITE_OTHER): Admitting: Family

## 2024-02-05 VITALS — BP 114/74 | HR 78 | Temp 98.7°F | Resp 16 | Ht <= 58 in | Wt 159.0 lb

## 2024-02-05 DIAGNOSIS — Z13228 Encounter for screening for other metabolic disorders: Secondary | ICD-10-CM

## 2024-02-05 DIAGNOSIS — Z0000 Encounter for general adult medical examination without abnormal findings: Secondary | ICD-10-CM | POA: Diagnosis not present

## 2024-02-05 DIAGNOSIS — Z130 Encounter for screening for diseases of the blood and blood-forming organs and certain disorders involving the immune mechanism: Secondary | ICD-10-CM | POA: Diagnosis not present

## 2024-02-05 DIAGNOSIS — Z13220 Encounter for screening for lipoid disorders: Secondary | ICD-10-CM | POA: Diagnosis not present

## 2024-02-05 DIAGNOSIS — Z1329 Encounter for screening for other suspected endocrine disorder: Secondary | ICD-10-CM

## 2024-02-05 DIAGNOSIS — L309 Dermatitis, unspecified: Secondary | ICD-10-CM

## 2024-02-05 MED ORDER — TRIAMCINOLONE ACETONIDE 0.025 % EX CREA: 1.0000 | TOPICAL_CREAM | Freq: Two times a day (BID) | CUTANEOUS | 1 refills | Status: AC

## 2024-02-05 NOTE — Progress Notes (Signed)
 Patient ID: Gwendolyn Lynch, female    DOB: [birth year]  MRN: [id number]  CC: Annual Exam  Subjective: Gwendolyn Lynch is a 38 y.o. female who presents for annual exam.   Her concerns today include:  - Rash of back and arms causing itching. Denies red flag symptoms.  - Established with Gynecology for women's health maintenance and screenings.  Patient Active Problem List   Diagnosis Date Noted   IDA (iron  deficiency anemia) 10/13/2020   Delivery by elective cesarean section 10/11/2020   Encounter for maternal care for scar from repeat cesarean delivery 10/11/2020   Hypertension affecting pregnancy, antepartum 05/03/2020   BMI 32.0-32.9,adult 04/05/2020   Supervision of high risk pregnancy, antepartum 03/19/2020   Obesity affecting pregnancy 03/19/2020   Hypertension    History of cesarean delivery, currently pregnant 04/22/2017   Thalassemia carrier 12/07/2016     Current Outpatient Medications on File Prior to Visit  Medication Sig Dispense Refill   etonogestrel  (NEXPLANON ) 68 MG IMPL implant 1 each by Subdermal route once.     gabapentin (NEURONTIN) 300 MG capsule Take 1 capsule (300 mg total) by mouth at bedtime. 90 capsule 0   SUMAtriptan (IMITREX) 25 MG tablet Take 25 mg (1 tablet total) by mouth at the start of the headache. May repeat in 2 hours x 1 if headache persists. Max of 2 tablets/24 hours. 30 tablet 2   No current facility-administered medications on file prior to visit.    No Known Allergies  Social History   Socioeconomic History   Marital status: Significant Other    Spouse name: Not on file   Number of children: 5   Years of education: Not on file   Highest education level: Not on file  Occupational History   Occupation: Mail carrier    Employer: [employer]  Tobacco Use   Smoking status: Never   Smokeless tobacco: Never  Vaping Use   Vaping status: Never Used  Substance and Sexual Activity   Alcohol use: No   Drug use: No   Sexual  activity: Yes    Birth control/protection: Implant  Other Topics Concern   Not on file  Social History Narrative   Being followed closely by OB/GYN due to prior high risk surgery.   Social Drivers of Corporate investment banker Strain: Low Risk  (01/29/2024)   Overall Financial Resource Strain (CARDIA)    Difficulty of Paying Living Expenses: Not very hard  Food Insecurity: No Food Insecurity (01/29/2024)   Hunger Vital Sign    Worried About Running Out of Food in the Last Year: Never true    Ran Out of Food in the Last Year: Never true  Transportation Needs: No Transportation Needs (01/29/2024)   PRAPARE - Administrator, Civil Service (Medical): No    Lack of Transportation (Non-Medical): No  Physical Activity: Sufficiently Active (01/29/2024)   Exercise Vital Sign    Days of Exercise per Week: 5 days    Minutes of Exercise per Session: 30 min  Stress: Stress Concern Present (01/29/2024)   [employer] of Occupational Health - Occupational Stress Questionnaire    Feeling of Stress : Rather much  Social Connections: Moderately Integrated (01/29/2024)   Social Connection and Isolation Panel [NHANES]    Frequency of Communication with Friends and Family: More than three times a week    Frequency of Social Gatherings with Friends and Family: More than three times a week    Attends Religious Services:  More than 4 times per year    Active Member of Clubs or Organizations: No    Attends Banker Meetings: Never    Marital Status: Living with partner  Intimate Partner Violence: Not At Risk (01/29/2024)   Humiliation, Afraid, Rape, and Kick questionnaire    Fear of Current or Ex-Partner: No    Emotionally Abused: No    Physically Abused: No    Sexually Abused: No    Family History  Problem Relation Age of Onset   Diabetes Mother    Hypertension Mother    Diabetes Father    Hypertension Father     Past Surgical History:  Procedure Laterality Date   CESAREAN  SECTION     C/S x 3   CESAREAN SECTION N/A 11/01/2015   Procedure: CESAREAN SECTION;  Surgeon: Marylu Soda, MD;  Location: [facility] ORS;  Service: Obstetrics;  Laterality: N/A;   CESAREAN SECTION N/A 04/22/2017   Procedure: REPEAT CESAREAN SECTION;  Surgeon: Abner Ables, MD;  Location: [facility] BIR[facility]ING SUIT[facility];  Service: Obstetrics;  Laterality: N/A;   CESAREAN SECTION N/A 10/11/2020   Procedure: CESAREAN SECTION;  Surgeon: Artemisa Bile, MD;  Location: [facility] LD ORS;  Service: Obstetrics;  Laterality: N/A;  REQUESTING 3 HOURS   CESAREAN SECTION     EVENT MONITOR  05/2020   Rhythm was normal sinus.  Average heart rate 92 bpm, max heart rate 166 bpm with minimum heart rate of 68 bpm.  Mostly sinus rhythm.  PACs and PVCs.  No arrhythmia noted.   NEXPLANON  TRAY  10/13/2020       TRANSTHORACIC ECHOCARDIOGRAM  05/22/2020   EF 55 to 60%.  Normal diastolic parameters.  Normal right ventricle.  Normal valves.    ROS: Review of Systems Negative except as stated above  PHYSICAL EXAM: BP 114/74   Pulse 78   Temp 98.7 F (37.1 C) (Oral)   Resp 16   Ht 4\' 10"  (1.473 m)   Wt 159 lb (72.1 kg)   LMP 12/22/2023 (Approximate)   SpO2 95%   BMI 33.23 kg/m   Physical Exam HENT:     Head: Normocephalic and atraumatic.     Right Ear: Tympanic membrane, ear canal and external ear normal.     Left Ear: Tympanic membrane, ear canal and external ear normal.     Nose: Nose normal.     Mouth/Throat:     Mouth: Mucous membranes are moist.     Pharynx: Oropharynx is clear.  Eyes:     Extraocular Movements: Extraocular movements intact.     Conjunctiva/sclera: Conjunctivae normal.     Pupils: Pupils are equal, round, and reactive to light.  Neck:     Thyroid: No thyroid mass, thyromegaly or thyroid tenderness.  Cardiovascular:     Rate and Rhythm: Normal rate and regular rhythm.     Pulses: Normal pulses.     Heart sounds: Normal heart sounds.  Pulmonary:     Effort: Pulmonary effort is normal.      Breath sounds: Normal breath sounds.  Chest:     Comments: Patient declined. Abdominal:     General: Bowel sounds are normal.     Palpations: Abdomen is soft.  Genitourinary:    Comments: Patient declined. Musculoskeletal:        General: Normal range of motion.     Right shoulder: Normal.     Left shoulder: Normal.     Right upper arm: Normal.     Left upper arm:  Normal.     Right elbow: Normal.     Left elbow: Normal.     Right forearm: Normal.     Left forearm: Normal.     Right wrist: Normal.     Left wrist: Normal.     Right hand: Normal.     Left hand: Normal.     Cervical back: Normal, normal range of motion and neck supple.     Thoracic back: Normal.     Lumbar back: Normal.     Right hip: Normal.     Left hip: Normal.     Right upper leg: Normal.     Left upper leg: Normal.     Right knee: Normal.     Left knee: Normal.     Right lower leg: Normal.     Left lower leg: Normal.     Right ankle: Normal.     Left ankle: Normal.     Right foot: Normal.     Left foot: Normal.  Skin:    General: Skin is warm and dry.     Capillary Refill: Capillary refill takes less than 2 seconds.     Findings: Rash present.  Neurological:     General: No focal deficit present.     Mental Status: She is alert and oriented to person, place, and time.  Psychiatric:        Mood and Affect: Mood normal.        Behavior: Behavior normal.     ASSESSMENT AND PLAN: 1. Annual physical exam (Primary) - Counseled on 150 minutes of exercise per week as tolerated, healthy eating (including decreased daily intake of saturated fats, cholesterol, added sugars, sodium), STI prevention, and routine healthcare maintenance.  2. Screening for metabolic disorder - Routine screening.  - CMP14+EGFR  3. Screening for deficiency anemia - Routine screening.  - CBC  4. Screening cholesterol level - Routine screening.  - Lipid panel  5. Thyroid disorder screen - Routine screening.  -  TSH  6. Eczema, unspecified type - Triamcinolone Cream as prescribed. Counseled on medication adherence/adverse effects.  - Follow-up with primary provider as scheduled. - triamcinolone (KENALOG) 0.025 % cream; Apply 1 Application topically 2 (two) times daily.  Dispense: 60 g; Refill: 1   Patient was given the opportunity to ask questions.  Patient verbalized understanding of the plan and was able to repeat key elements of the plan. Patient was given clear instructions to go to Emergency Department or return to medical center if symptoms don't improve, worsen, or new problems develop.The patient verbalized understanding.   Orders Placed This Encounter  Procedures   CBC   Lipid panel   CMP14+EGFR   TSH     Requested Prescriptions   Signed Prescriptions Disp Refills   triamcinolone (KENALOG) 0.025 % cream 60 g 1    Sig: Apply 1 Application topically 2 (two) times daily.    Return in about 1 year (around 02/04/2025) for Physical per patient preference.  Senaida Dama, NP

## 2024-02-06 LAB — CBC
Hematocrit: 39.5 % (ref 34.0–46.6)
Hemoglobin: 12.4 g/dL (ref 11.1–15.9)
MCH: 26.4 pg — ABNORMAL LOW (ref 26.6–33.0)
MCHC: 31.4 g/dL — ABNORMAL LOW (ref 31.5–35.7)
MCV: 84 fL (ref 79–97)
Platelets: 324 10*3/uL (ref 150–450)
RBC: 4.7 x10E6/uL (ref 3.77–5.28)
RDW: 13.8 % (ref 11.7–15.4)
WBC: 6.5 10*3/uL (ref 3.4–10.8)

## 2024-02-06 LAB — CMP14+EGFR
ALT: 16 IU/L (ref 0–32)
AST: 26 IU/L (ref 0–40)
Albumin: 4.7 g/dL (ref 3.9–4.9)
Alkaline Phosphatase: 62 IU/L (ref 44–121)
BUN/Creatinine Ratio: 17 (ref 9–23)
BUN: 15 mg/dL (ref 6–20)
Bilirubin Total: 0.3 mg/dL (ref 0.0–1.2)
CO2: 21 mmol/L (ref 20–29)
Calcium: 9.3 mg/dL (ref 8.7–10.2)
Chloride: 104 mmol/L (ref 96–106)
Creatinine, Ser: 0.87 mg/dL (ref 0.57–1.00)
Globulin, Total: 2.9 g/dL (ref 1.5–4.5)
Glucose: 90 mg/dL (ref 70–99)
Potassium: 4.2 mmol/L (ref 3.5–5.2)
Sodium: 142 mmol/L (ref 134–144)
Total Protein: 7.6 g/dL (ref 6.0–8.5)
eGFR: 88 mL/min/{1.73_m2} (ref >59)

## 2024-02-06 LAB — LIPID PANEL
Chol/HDL Ratio: 3.3 ratio (ref 0.0–4.4)
Cholesterol, Total: 148 mg/dL (ref 100–199)
HDL: 45 mg/dL (ref >39)
LDL Chol Calc (NIH): 92 mg/dL (ref 0–99)
Triglycerides: 49 mg/dL (ref 0–149)
VLDL Cholesterol Cal: 11 mg/dL (ref 5–40)

## 2024-02-06 LAB — TSH: TSH: 1.02 u[IU]/mL (ref 0.450–4.500)

## 2024-02-08 ENCOUNTER — Ambulatory Visit: Payer: Self-pay | Admitting: Family

## 2024-03-22 ENCOUNTER — Encounter: Payer: Self-pay | Admitting: Neurology

## 2024-03-22 ENCOUNTER — Ambulatory Visit: Admitting: Neurology

## 2024-03-22 VITALS — BP 130/73 | HR 71 | Ht <= 58 in | Wt 162.0 lb

## 2024-03-22 DIAGNOSIS — R202 Paresthesia of skin: Secondary | ICD-10-CM

## 2024-03-22 DIAGNOSIS — G43709 Chronic migraine without aura, not intractable, without status migrainosus: Secondary | ICD-10-CM

## 2024-03-22 MED ORDER — NORTRIPTYLINE HCL 10 MG PO CAPS: ORAL_CAPSULE | ORAL | 5 refills | Status: AC

## 2024-03-22 NOTE — Patient Instructions (Signed)
 For headache prevention, start nortriptyline 10mg  at bedtime for 2 week, then increase to 2 tablet at bedtime  For severe migraine, you may take imitrex , but limit to twice per week  Nerve testing of the hands  ELECTROMYOGRAM AND NERVE CONDUCTION STUDIES (EMG/NCS) INSTRUCTIONS  How to Prepare The neurologist conducting the EMG will need to know if you have certain medical conditions. Tell the neurologist and other EMG lab personnel if you: Have a pacemaker or any other electrical medical device Take blood-thinning medications Have hemophilia, a blood-clotting disorder that causes prolonged bleeding Bathing Take a shower or bath shortly before your exam in order to remove oils from your skin. Don't apply lotions or creams before the exam.  What to Expect You'll likely be asked to change into a hospital gown for the procedure and lie down on an examination table. The following explanations can help you understand what will happen during the exam.  Electrodes. The neurologist or a technician places surface electrodes at various locations on your skin depending on where you're experiencing symptoms. Or the neurologist may insert needle electrodes at different sites depending on your symptoms.  Sensations. The electrodes will at times transmit a tiny electrical current that you may feel as a twinge or spasm. The needle electrode may cause discomfort or pain that usually ends shortly after the needle is removed. If you are concerned about discomfort or pain, you may want to talk to the neurologist about taking a short break during the exam.  Instructions. During the needle EMG, the neurologist will assess whether there is any spontaneous electrical activity when the muscle is at rest - activity that isn't present in healthy muscle tissue - and the degree of activity when you slightly contract the muscle.  He or she will give you instructions on resting and contracting a muscle at appropriate times.  Depending on what muscles and nerves the neurologist is examining, he or she may ask you to change positions during the exam.  After your EMG You may experience some temporary, minor bruising where the needle electrode was inserted into your muscle. This bruising should fade within several days. If it persists, contact your primary care doctor.

## 2024-03-22 NOTE — Progress Notes (Signed)
 [facility] Neurology Division Clinic Note - Initial Visit   Date: 03/22/2024   Gwendolyn Lynch MRN: [id number] DOB: [birth year]   Dear Greig Drones, NP:  Thank you for your kind referral of [name] for consultation of headaches. Although her history is well known to you, please allow us  to reiterate it for the purpose of our medical record. The patient was accompanied to the clinic by self.    Gwendolyn Lynch is a 38 y.o. right-handed female presenting for evaluation of headaches and bilateral hand tingling.   IMPRESSION/PLAN: Chronic migraine  - Start nortriptyline 10mg  at bedtime for 2 week, then increase to 2 tablet at bedtime  - OK to take imitrex  for severe migraine.  Limit to twice per week  2.   Bilateral hand paresthesias, possibly carpal tunnel syndrome  - NCS/EMG of the hands  - Stop gabapentin .  Hopefully nortriptyline will help with this also.  Return to clinic in 4 months  ------------------------------------------------------------- History of present illness: For the past several years, she has achy/throbbing pain at the back of her head, which occurs every day and lasts for 12 hours.  She takes imitrex  which provides relief.   She endorse photophobia and nausea.  She has no relief with ibuprofen  or tylenol .  Sleep/rest would help sometimes.  Mother has migraines.    She also has throbbing pain involving the forearms and hands.  She also has numbness/tingling in the fingers. 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  She denies weakness or dropping things.    Nonsmoker.  She has 6 sons (3 ,6, 8, 26, 41, 20) and husband at home.  Nonsomoker and does not drink alcohol.   Out-side paper records, electronic medical record, and images have been reviewed where available and summarized as:  Lab Results  Component Value Date   HGBA1C 5.8 (A) 01/29/2024   Lab Results  Component Value Date   TSH 1.020  02/05/2024    Past Medical History:  Diagnosis Date   Anemia    Hemorrhoids    Hyperemesis gravidarum    Hypertension affecting pregnancy    Has had hypertension during previous pregnancies   Tachycardia    Thalassemia carrier     Past Surgical History:  Procedure Laterality Date   CESAREAN SECTION     C/S x 3   CESAREAN SECTION N/A 11/01/2015   Procedure: CESAREAN SECTION;  Surgeon: Ovid All, MD;  Location: [facility] ORS;  Service: Obstetrics;  Laterality: N/A;   CESAREAN SECTION N/A 04/22/2017   Procedure: REPEAT CESAREAN SECTION;  Surgeon: Eldonna Suzen Octave, MD;  Location: [facility]THING SUIT[facility];  Service: Obstetrics;  Laterality: N/A;   CESAREAN SECTION N/A 10/11/2020   Procedure: CESAREAN SECTION;  Surgeon: Bettina Muskrat, MD;  Location: [facility] ORS;  Service: Obstetrics;  Laterality: N/A;  REQUESTING 3 HOURS   CESAREAN SECTION     EVENT MONITOR  05/2020   Rhythm was normal sinus.  Average heart rate 92 bpm, max heart rate 166 bpm with minimum heart rate of 68 bpm.  Mostly sinus rhythm.  PACs and PVCs.  No arrhythmia noted.   NEXPLANON  TRAY  10/13/2020       TRANSTHORACIC ECHOCARDIOGRAM  05/22/2020   EF 55 to 60%.  Normal diastolic parameters.  Normal right ventricle.  Normal valves.     Medications:  Outpatient Encounter Medications as of 03/22/2024  Medication Sig   etonogestrel  (NEXPLANON ) 68 MG IMPL  implant 1 each by Subdermal route once.   gabapentin  (NEURONTIN ) 300 MG capsule Take 1 capsule (300 mg total) by mouth at bedtime.   SUMAtriptan  (IMITREX ) 25 MG tablet Take 25 mg (1 tablet total) by mouth at the start of the headache. May repeat in 2 hours x 1 if headache persists. Max of 2 tablets/24 hours.   triamcinolone  (KENALOG ) 0.025 % cream Apply 1 Application topically 2 (two) times daily.   No facility-administered encounter medications on file as of 03/22/2024.    Allergies: No Known Allergies  Family History: Family History  Problem Relation Age of Onset    Diabetes Mother    Hypertension Mother    Diabetes Father    Hypertension Father     Social History: Social History   Tobacco Use   Smoking status: Never   Smokeless tobacco: Never  Vaping Use   Vaping status: Never Used  Substance Use Topics   Alcohol use: No   Drug use: No   Social History   Social History Narrative   Being followed closely by OB/GYN due to prior high risk surgery.         Are you right handed or left handed? Right Handed   Are you currently employed ? Yes   What is your current occupation?   Do you live at home alone? No    Who lives with you? 6 sons and their dad.   What type of home do you live in: 1 story or 2 story? Lives in a two story home         Vital Signs:  BP 130/73   Pulse 71   Ht 4' 10 (1.473 m)   Wt 162 lb (73.5 kg)   SpO2 96%   BMI 33.86 kg/m   Neurological Exam: MENTAL STATUS including orientation to time, place, person, recent and remote memory, attention span and concentration, language, and fund of knowledge is normal.  Speech is not dysarthric.  CRANIAL NERVES: II:  No visual field defects.     III-IV-VI: Pupils equal round and reactive to light.  Normal conjugate, extra-ocular eye movements in all directions of gaze.  No nystagmus.  No ptosis.   V:  Normal facial sensation.    VII:  Normal facial symmetry and movements.   VIII:  Normal hearing and vestibular function.   IX-X:  Normal palatal movement.   XI:  Normal shoulder shrug and head rotation.   XII:  Normal tongue strength and range of motion, no deviation or fasciculation.  MOTOR:  No atrophy, fasciculations or abnormal movements.  No pronator drift.   Upper Extremity:  Right  Left  Deltoid  5/5   5/5   Biceps  5/5   5/5   Triceps  5/5   5/5   Wrist extensors  5/5   5/5   Wrist flexors  5/5   5/5   Finger extensors  5/5   5/5   Finger flexors  5/5   5/5   Dorsal interossei  5/5   5/5   Abductor pollicis  5/5   5/5   Tone (Ashworth scale)  0  0    Lower Extremity:  Right  Left  Hip flexors  5/5   5/5   Knee flexors  5/5   5/5   Knee extensors  5/5   5/5   Dorsiflexors  5/5   5/5   Plantarflexors  5/5   5/5   Toe extensors  5/5   5/5  Toe flexors  5/5   5/5   Tone (Ashworth scale)  0  0   MSRs:                                           Right        Left brachioradialis 2+  2+  biceps 2+  2+  triceps 2+  2+  patellar 2+  2+  ankle jerk 2+  2+  Hoffman no  no  plantar response down  down   SENSORY:  Normal and symmetric perception of light touch, pinprick, vibration, and temperature.  Tinel sign positive on the right.   COORDINATION/GAIT: Normal finger-to- nose-finger.  Intact rapid alternating movements bilaterally.  Able to rise from a chair without using arms.  Gait narrow based and stable. Tandem and stressed gait intact.    Thank you for allowing me to participate in patient's care.  If I can answer any additional questions, I would be pleased to do so.    Sincerely,    Dilia Alemany K. Tobie, DO

## 2024-05-05 ENCOUNTER — Encounter: Payer: Self-pay | Admitting: Neurology

## 2024-05-05 ENCOUNTER — Ambulatory Visit: Admitting: Neurology

## 2024-05-05 DIAGNOSIS — R202 Paresthesia of skin: Secondary | ICD-10-CM | POA: Diagnosis not present

## 2024-05-05 DIAGNOSIS — G5603 Carpal tunnel syndrome, bilateral upper limbs: Secondary | ICD-10-CM

## 2024-05-05 NOTE — Procedures (Signed)
 [facility]  [address] [place], [address]  [place], [place] [address] Tel: [phone] Fax: [phone] Test Date:  05/05/2024  Patient: Gwendolyn Lynch DOB: 24-Dec-1985 Physician: Tonita Blanch, DO  Sex: Female Height: 4' 10 Ref Phys: Tonita Blanch, DO  ID#: [id number]   Technician:    History: This is a 38 year old female referred for evaluation of bilateral hand paresthesias.  NCV & EMG Findings: Extensive electrodiagnostic testing of the right upper extremity and additional studies of the left shows:  Bilateral median sensory responses show prolonged latency (R5.9, L4.7 ms) and reduced amplitude (R5.5, L12.5 V).  Bilateral ulnar sensory responses are within normal limits. Bilateral median motor responses showed severely prolonged latency (R8.8, L7.3 ms) and reduced amplitude (R3.9, L5.9, mV).  Bilateral ulnar motor responses are within normal limits. Chronic motor axon loss changes are seen affecting bilateral abductor pollicis brevis muscle, without accompanying active denervation.   Impression: Bilateral median neuropathy at or distal to the wrist, consistent with a clinical diagnosis of carpal tunnel syndrome.  Overall, these findings are severe in degree electrically.   ___________________________ Tonita Blanch, DO    Nerve Conduction Studies   Stim Site NR Peak (ms) Norm Peak (ms) O-P Amp (V) Norm O-P Amp  Left Median Anti Sensory (2nd Digit)  32 C  Wrist    *4.7 <3.4 *12.5 >20  Right Median Anti Sensory (2nd Digit)  32 C  Wrist    *5.9 <3.4 *5.5 >20  Left Ulnar Anti Sensory (5th Digit)  32 C  Wrist    2.3 <3.1 44.0 >12  Right Ulnar Anti Sensory (5th Digit)  32 C  Wrist    2.3 <3.1 37.5 >12     Stim Site NR Onset (ms) Norm Onset (ms) O-P Amp (mV) Norm O-P Amp Site1 Site2 Delta-0 (ms) Dist (cm) Vel (m/s) Norm Vel (m/s)  Left Median Motor (Abd Poll Brev)  32 C  Wrist    *7.3 <3.9 *5.9 >6 Elbow Wrist 4.2 25.0 60 >50  Elbow    11.5  5.3         Right  Median Motor (Abd Poll Brev)  32 C  Wrist    *8.8 <3.9 *3.9 >6 Elbow Wrist 4.6 25.0 54 >50  Elbow    13.4  3.6         Left Ulnar Motor (Abd Dig Minimi)  32 C  Wrist    1.9 <3.1 12.3 >7 B Elbow Wrist 2.6 16.0 62 >50  B Elbow    4.5  12.3  A Elbow B Elbow 1.6 10.0 63 >50  A Elbow    6.1  12.2         Right Ulnar Motor (Abd Dig Minimi)  32 C  Wrist    2.0 <3.1 12.5 >7 B Elbow Wrist 2.8 19.0 68 >50  B Elbow    4.8  11.0  A Elbow B Elbow 1.5 10.0 67 >50  A Elbow    6.3  10.3          Electromyography   Side Muscle Ins.Act Fibs Fasc Recrt Amp Dur Poly Activation Comment  Right 1stDorInt Nml Nml Nml Nml Nml Nml Nml Nml N/A  Right Abd Poll Brev Nml Nml Nml *2- *1+ *1+ *1+ Nml N/A  Right PronatorTeres Nml Nml Nml Nml Nml Nml Nml Nml N/A  Right Biceps Nml Nml Nml Nml Nml Nml Nml Nml N/A  Right Triceps Nml Nml Nml Nml Nml Nml Nml Nml N/A  Right Deltoid  Nml Nml Nml Nml Nml Nml Nml Nml N/A  Left 1stDorInt Nml Nml Nml Nml Nml Nml Nml Nml N/A  Left Abd Poll Brev Nml Nml Nml *2- *1+ *1+ *1+ Nml N/A  Left PronatorTeres Nml Nml Nml Nml Nml Nml Nml Nml N/A  Left Biceps Nml Nml Nml Nml Nml Nml Nml Nml N/A  Left Triceps Nml Nml Nml Nml Nml Nml Nml Nml N/A  Left Deltoid Nml Nml Nml Nml Nml Nml Nml Nml N/A      Waveforms:

## 2024-05-06 ENCOUNTER — Ambulatory Visit: Payer: Self-pay | Admitting: Neurology

## 2024-05-18 ENCOUNTER — Encounter: Payer: Self-pay | Admitting: Neurology

## 2024-05-18 ENCOUNTER — Telehealth: Payer: Self-pay | Admitting: Neurology

## 2024-05-18 ENCOUNTER — Ambulatory Visit: Admitting: Neurology

## 2024-05-18 VITALS — BP 137/89 | HR 77 | Ht <= 58 in | Wt 160.0 lb

## 2024-05-18 DIAGNOSIS — G5603 Carpal tunnel syndrome, bilateral upper limbs: Secondary | ICD-10-CM

## 2024-05-18 DIAGNOSIS — Z0279 Encounter for issue of other medical certificate: Secondary | ICD-10-CM

## 2024-05-18 NOTE — Telephone Encounter (Signed)
 Received

## 2024-05-18 NOTE — Addendum Note (Signed)
 Addended by: DASIE RHODY A on: 05/18/2024 11:45 AM   Modules accepted: Orders

## 2024-05-18 NOTE — Telephone Encounter (Signed)
 FMLA paperwork in box, paid.

## 2024-05-18 NOTE — Progress Notes (Signed)
 Follow-up Visit   Date: 05/18/2024    [name] MRN: [id number] DOB: Oct 19, 1985    Gwendolyn Lynch is a 38 y.o. right-handed female returning to the clinic for follow-up of chronic daily headaches and bilateral carpal tunnel syndrome.  The patient was accompanied to the clinic by self.  IMPRESSION/PLAN: Bilateral carpal tunnel syndrome, severe on electrodiagnostic testing.  She has no improvement with wrist splints.  She is prediabetic.  No history of thyroid disease.  With her bilateral symptoms, I will also screen her for hereditary transthyretin amyloidosis  - Referral to hand orthopaedics  2.  Chronic daily headaches, improved on nortriptyline   - Continue nortriptyline  10mg  at bedtime  Return to clinic in 6 months --------------------------------------------- History of present illness: For the past several years, she has achy/throbbing pain at the back of her head, which occurs every day and lasts for 12 hours.  She takes imitrex  which provides relief.   She endorse photophobia and nausea.  She has no relief with ibuprofen  or tylenol .  Sleep/rest would help sometimes.  Mother has migraines.     She also has throbbing pain involving the forearms and hands.  She also has numbness/tingling in the fingers. 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  She denies weakness or dropping things.     Nonsmoker.  She has 6 sons (3 ,6, 8, 38, 7, 20) and husband at home.  Nonsomoker and does not drink alcohol.   UPDATE 05/18/2024:  She is here to discuss EMG results. NCS/EMG of the arms shows severe bilateral carpal tunnel syndrome bilaterally, worse on the left.  She had tried using wrist braces which tends to aggravate her symptoms more.  She endorses dropping things and has noticed weakness in the hands.   Since taking nortriptyline  10mg  at bedtime, her headaches have significantly improved.  She now gets them about once per week.     Father has history of bilateral carpal tunnel syndrome when he was younger.  No family history of heart disease.   Medications:  Current Outpatient Medications on File Prior to Visit  Medication Sig Dispense Refill   etonogestrel  (NEXPLANON ) 68 MG IMPL implant 1 each by Subdermal route once.     gabapentin  (NEURONTIN ) 300 MG capsule Take 1 capsule (300 mg total) by mouth at bedtime. 90 capsule 0   triamcinolone  (KENALOG ) 0.025 % cream Apply 1 Application topically 2 (two) times daily. 60 g 1   nortriptyline  (PAMELOR ) 10 MG capsule Take nortriptyline  10mg  at bedtime for 2 week, then increase to 2 tablet at bedtime (Patient not taking: Reported on 05/18/2024) 60 capsule 5   SUMAtriptan  (IMITREX ) 25 MG tablet Take 25 mg (1 tablet total) by mouth at the start of the headache. May repeat in 2 hours x 1 if headache persists. Max of 2 tablets/24 hours. (Patient not taking: Reported on 05/18/2024) 30 tablet 2   No current facility-administered medications on file prior to visit.    Allergies: No Known Allergies  Vital Signs:  BP 137/89   Pulse 77   Ht 4' 10 (1.473 m)   Wt 160 lb (72.6 kg)   SpO2 98%   BMI 33.44 kg/m   Neurological Exam: MENTAL STATUS including orientation to time, place, person, recent and remote memory, attention span and concentration, language, and fund of knowledge is normal.  Speech is not dysarthric.  CRANIAL NERVES:    Pupils equal round and reactive to  light.  Normal conjugate, extra-ocular eye movements in all directions of gaze.  No ptosis.  Face is symmetric.   MOTOR:  Motor strength is 5/5 in all extremities, trace weakness of ABP bilaterally.  No atrophy, fasciculations or abnormal movements.  No pronator drift.  Tone is normal.    MSRs:  Reflexes are 2+/4 throughout.  SENSORY:  Intact to vibration throughout.  COORDINATION/GAIT:  Normal finger-to- nose-finger.  Intact rapid alternating movements bilaterally.  Gait narrow based and stable.    Data: NCS/EMG of the bilateral arms 05/05/2024: Bilateral median neuropathy at or distal to the wrist, consistent with a clinical diagnosis of carpal tunnel syndrome.  Overall, these findings are severe in degree electrically.   Lab Results  Component Value Date   HGBA1C 5.8 (A) 01/29/2024   Lab Results  Component Value Date   TSH 1.020 02/05/2024     Thank you for allowing me to participate in patient's care.  If I can answer any additional questions, I would be pleased to do so.    Sincerely,    Sonita Michiels K. Tobie, DO

## 2024-05-18 NOTE — Addendum Note (Signed)
 Addended by: DASIE RHODY A on: 05/18/2024 11:19 AM   Modules accepted: Orders

## 2024-05-19 ENCOUNTER — Telehealth: Payer: Self-pay

## 2024-05-19 NOTE — Telephone Encounter (Signed)
 FMLA paperwork received for patient. Need to let her know it would be best filled out by Ortho as she may need surgery which would change FMLA. Patient may get a refund of $25 paid on 05/18/24  Called patient and informed her of above. Patient verbalized understanding and had no further questions or concerns.

## 2024-06-30 ENCOUNTER — Telehealth: Payer: Self-pay | Admitting: Neurology

## 2024-06-30 NOTE — Telephone Encounter (Signed)
 Anika from [employer] would like a call back in reference to [employer]. Please give reference number when calling , which [id number]

## 2024-07-05 NOTE — Telephone Encounter (Signed)
 Called and spoke to [facility] and was informed that the sample that was sent was the old form. 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 Susie stated that this is the test originally ordered but they just need to confirm since an old order form was used. Confirmed that this is correct for patient and confirmed that this is the free test for patient.  Susie had no further questions or concerns.

## 2024-07-21 ENCOUNTER — Telehealth: Payer: Self-pay | Admitting: Neurology

## 2024-07-21 NOTE — Telephone Encounter (Signed)
 Please let pt know that genetic testing for hereditary causes for carpal tunnel syndrome was negative.  Thanks.

## 2024-07-22 NOTE — Telephone Encounter (Signed)
 Called patient and informed her of results. Patient wanted to know if there was anything else that was tested? Informed patient I would ask and contact her back.   Patient would like a copy of the genetic testing mailed to her. Patient aware I will get her a copy mailed.

## 2024-07-22 NOTE — Telephone Encounter (Signed)
 Called patient and informed her of Dr. Anthony response. Patient verbalized understanding and no further questions or concerns.

## 2024-08-08 ENCOUNTER — Ambulatory Visit: Admitting: Neurology

## 2024-11-21 ENCOUNTER — Ambulatory Visit: Admitting: Neurology

## 2025-02-06 ENCOUNTER — Encounter: Admitting: Family
